# Patient Record
Sex: Male | Born: 1985
Health system: Southern US, Community
[De-identification: ages and names within clinical notes are randomized; demographics above are authoritative.]

## PROBLEM LIST (undated history)

## (undated) DIAGNOSIS — K5090 Crohn's disease, unspecified, without complications: Secondary | ICD-10-CM

## (undated) DIAGNOSIS — K435 Parastomal hernia without obstruction or  gangrene: Secondary | ICD-10-CM

## (undated) DIAGNOSIS — M0760 Enteropathic arthropathies, unspecified site: Secondary | ICD-10-CM

## (undated) DIAGNOSIS — D649 Anemia, unspecified: Secondary | ICD-10-CM

## (undated) DIAGNOSIS — K209 Esophagitis, unspecified without bleeding: Secondary | ICD-10-CM

## (undated) DIAGNOSIS — K3189 Other diseases of stomach and duodenum: Secondary | ICD-10-CM

## (undated) DIAGNOSIS — F419 Anxiety disorder, unspecified: Secondary | ICD-10-CM

## (undated) DIAGNOSIS — K219 Gastro-esophageal reflux disease without esophagitis: Secondary | ICD-10-CM

## (undated) DIAGNOSIS — T8859XA Other complications of anesthesia, initial encounter: Secondary | ICD-10-CM

## (undated) DIAGNOSIS — I10 Essential (primary) hypertension: Secondary | ICD-10-CM

## (undated) DIAGNOSIS — K648 Other hemorrhoids: Secondary | ICD-10-CM

## (undated) DIAGNOSIS — F32A Depression, unspecified: Secondary | ICD-10-CM

## (undated) HISTORY — PX: OTHER SURGICAL HISTORY: SHX169

## (undated) HISTORY — DX: Essential (primary) hypertension: I10

## (undated) HISTORY — DX: Esophagitis, unspecified without bleeding: K20.90

## (undated) HISTORY — DX: Parastomal hernia without obstruction or gangrene: K43.5

## (undated) HISTORY — DX: Other hemorrhoids: K64.8

## (undated) HISTORY — PX: COLON SURGERY: SHX602

## (undated) HISTORY — PX: NECK MASS EXCISION: SHX2079

## (undated) HISTORY — DX: Other diseases of stomach and duodenum: K31.89

---

## 2012-08-27 ENCOUNTER — Emergency Department (HOSPITAL_COMMUNITY)
Admission: EM | Admit: 2012-08-27 | Discharge: 2012-08-27 | Disposition: A | Discharge status: Home / Self Care | Payer: Managed Care, Other (non HMO) | Attending: Emergency Medicine | Admitting: Emergency Medicine

## 2012-08-27 ENCOUNTER — Encounter (HOSPITAL_COMMUNITY): Payer: Self-pay | Admitting: *Deleted

## 2012-08-27 DIAGNOSIS — G8929 Other chronic pain: Secondary | ICD-10-CM | POA: Insufficient documentation

## 2012-08-27 DIAGNOSIS — IMO0002 Reserved for concepts with insufficient information to code with codable children: Secondary | ICD-10-CM | POA: Insufficient documentation

## 2012-08-27 DIAGNOSIS — K5090 Crohn's disease, unspecified, without complications: Secondary | ICD-10-CM | POA: Insufficient documentation

## 2012-08-27 HISTORY — DX: Crohn's disease, unspecified, without complications: K50.90

## 2012-08-27 LAB — CBC WITH DIFFERENTIAL/PLATELET
Basophils Relative: 0 % (ref 0–1)
Eosinophils Absolute: 0.2 10*3/uL (ref 0.0–0.7)
HCT: 34.3 % — ABNORMAL LOW (ref 39.0–52.0)
Hemoglobin: 10.9 g/dL — ABNORMAL LOW (ref 13.0–17.0)
Lymphs Abs: 1.1 10*3/uL (ref 0.7–4.0)
MCH: 25.7 pg — ABNORMAL LOW (ref 26.0–34.0)
MCHC: 31.8 g/dL (ref 30.0–36.0)
Monocytes Absolute: 0.8 10*3/uL (ref 0.1–1.0)
Monocytes Relative: 9 % (ref 3–12)
Neutro Abs: 7.1 10*3/uL (ref 1.7–7.7)
Neutrophils Relative %: 77 % (ref 43–77)
RBC: 4.24 MIL/uL (ref 4.22–5.81)

## 2012-08-27 LAB — URINALYSIS, ROUTINE W REFLEX MICROSCOPIC
Bilirubin Urine: NEGATIVE
Ketones, ur: NEGATIVE mg/dL
Nitrite: NEGATIVE
Protein, ur: NEGATIVE mg/dL

## 2012-08-27 LAB — COMPREHENSIVE METABOLIC PANEL
Albumin: 3.4 g/dL — ABNORMAL LOW (ref 3.5–5.2)
Alkaline Phosphatase: 34 U/L — ABNORMAL LOW (ref 39–117)
BUN: 8 mg/dL (ref 6–23)
Chloride: 100 mEq/L (ref 96–112)
Creatinine, Ser: 0.68 mg/dL (ref 0.50–1.35)
GFR calc Af Amer: >90 mL/min (ref >90)
Glucose, Bld: 97 mg/dL (ref 70–99)
Potassium: 3.6 mEq/L (ref 3.5–5.1)
Total Bilirubin: 0.3 mg/dL (ref 0.3–1.2)

## 2012-08-27 LAB — URINE MICROSCOPIC-ADD ON

## 2012-08-27 MED ORDER — SODIUM CHLORIDE 0.9 % IV SOLN
INTRAVENOUS | Status: DC
  Administered 2012-08-27: 06:00:00 via INTRAVENOUS

## 2012-08-27 MED ORDER — HYDROMORPHONE HCL PF 1 MG/ML IJ SOLN
2.0000 mg | Freq: Once | INTRAMUSCULAR | Status: AC
  Administered 2012-08-27: 2 mg via INTRAVENOUS
  Filled 2012-08-27: qty 2

## 2012-08-27 MED ORDER — ONDANSETRON HCL 4 MG/2ML IJ SOLN
4.0000 mg | Freq: Once | INTRAMUSCULAR | Status: AC
  Administered 2012-08-27: 4 mg via INTRAVENOUS
  Filled 2012-08-27: qty 2

## 2012-08-27 MED ORDER — SODIUM CHLORIDE 0.9 % IV BOLUS (SEPSIS)
1000.0000 mL | Freq: Once | INTRAVENOUS | Status: AC
  Administered 2012-08-27: 1000 mL via INTRAVENOUS

## 2012-08-27 NOTE — ED Notes (Signed)
Pt reports severe abd pain with nauses-pt reports hx of crohn's disease.  Pt also reports joint pain.

## 2012-08-27 NOTE — ED Provider Notes (Signed)
History     CSN: [id number]  Arrival date & time 08/27/12  0234   First MD Initiated Contact with Patient 08/27/12 0403      Chief Complaint  Patient presents with  . Abdominal Pain    (Consider location/radiation/quality/duration/timing/severity/associated sxs/prior treatment) Patient is a 27 y.o. male presenting with abdominal pain. The history is provided by the patient.  Abdominal Pain Associated symptoms include abdominal pain.   patient here complaining of worsening chronic abdominal pain secondary to his Crohn's disease. Takes methotrexate and prednisone as directed. No fever but some chills. Nausea but no vomiting. No bloody stools. Symptoms have been persistent. He is between doctors at this time. Denies any urinary symptoms. His current medications are not helping and pain is characterized as sharp. Nothing makes it worse.  Past Medical History  Diagnosis Date  . Crohn disease     History reviewed. No pertinent past surgical history.  No family history on file.  History  Substance Use Topics  . Smoking status: Never Smoker   . Smokeless tobacco: Not on file  . Alcohol Use: No      Review of Systems  Gastrointestinal: Positive for abdominal pain.  All other systems reviewed and are negative.    Allergies  Humira  Home Medications   Current Outpatient Rx  Name  Route  Sig  Dispense  Refill  . acetaminophen (TYLENOL) 500 MG tablet   Oral   Take 500 mg by mouth every 6 (six) hours as needed for pain (pain).         Marland Kitchen ibuprofen (ADVIL,MOTRIN) 200 MG tablet   Oral   Take 200 mg by mouth every 6 (six) hours as needed for pain (pain).         . Methotrexate Sodium, PF, 50 MG/2ML SOLN   Injection   Inject 25 mg as directed once a week.         . predniSONE (DELTASONE) 10 MG tablet   Oral   Take 20 mg by mouth daily.           BP 147/93  Pulse 82  Temp(Src) 98.8 F (37.1 C) (Oral)  Resp 20  SpO2 98%  Physical Exam  Nursing note and  vitals reviewed. Constitutional: He is oriented to person, place, and time. He appears well-developed and well-nourished.  Non-toxic appearance. No distress.  HENT:  Head: Normocephalic and atraumatic.  Eyes: Conjunctivae, EOM and lids are normal. Pupils are equal, round, and reactive to light.  Neck: Normal range of motion. Neck supple. No tracheal deviation present. No mass present.  Cardiovascular: Normal rate, regular rhythm and normal heart sounds.  Exam reveals no gallop.   No murmur heard. Pulmonary/Chest: Effort normal and breath sounds normal. No stridor. No respiratory distress. He has no decreased breath sounds. He has no wheezes. He has no rhonchi. He has no rales.  Abdominal: Soft. Normal appearance and bowel sounds are normal. He exhibits no distension. There is generalized tenderness. There is no rigidity, no rebound, no guarding and no CVA tenderness.  Musculoskeletal: Normal range of motion. He exhibits no edema and no tenderness.  Neurological: He is alert and oriented to person, place, and time. He has normal strength. No cranial nerve deficit or sensory deficit. GCS eye subscore is 4. GCS verbal subscore is 5. GCS motor subscore is 6.  Skin: Skin is warm and dry. No abrasion and no rash noted.  Psychiatric: He has a normal mood and affect. His speech is normal and  behavior is normal.    ED Course  Procedures (including critical care time)  Labs Reviewed  URINALYSIS, ROUTINE W REFLEX MICROSCOPIC - Abnormal; Notable for the following:    Leukocytes, UA TRACE (*)    All other components within normal limits  URINE MICROSCOPIC-ADD ON  LIPASE, BLOOD  CBC WITH DIFFERENTIAL  COMPREHENSIVE METABOLIC PANEL   No results found.   No diagnosis found.    MDM  Patient given IV fluids and pain medications here feels better. He has no evidence of a surgical abdomen at this time. He will continue to take his prednisone and methotrexate and I will give referral to the  gastroenterologist on call        Toy Baker, MD 08/27/12 [phone]

## 2012-08-29 ENCOUNTER — Inpatient Hospital Stay (HOSPITAL_COMMUNITY)
Admission: EM | Admit: 2012-08-29 | Discharge: 2012-09-03 | DRG: 387 | Disposition: A | Discharge status: Home / Self Care | Payer: Managed Care, Other (non HMO) | Attending: Internal Medicine | Admitting: Internal Medicine

## 2012-08-29 ENCOUNTER — Emergency Department (HOSPITAL_COMMUNITY): Payer: Managed Care, Other (non HMO)

## 2012-08-29 ENCOUNTER — Encounter (HOSPITAL_COMMUNITY): Payer: Self-pay | Admitting: Emergency Medicine

## 2012-08-29 DIAGNOSIS — F1210 Cannabis abuse, uncomplicated: Secondary | ICD-10-CM | POA: Diagnosis present

## 2012-08-29 DIAGNOSIS — K50111 Crohn's disease of large intestine with rectal bleeding: Secondary | ICD-10-CM

## 2012-08-29 DIAGNOSIS — K921 Melena: Secondary | ICD-10-CM | POA: Diagnosis present

## 2012-08-29 DIAGNOSIS — IMO0002 Reserved for concepts with insufficient information to code with codable children: Secondary | ICD-10-CM

## 2012-08-29 DIAGNOSIS — D509 Iron deficiency anemia, unspecified: Secondary | ICD-10-CM | POA: Diagnosis present

## 2012-08-29 DIAGNOSIS — G894 Chronic pain syndrome: Secondary | ICD-10-CM | POA: Diagnosis present

## 2012-08-29 DIAGNOSIS — R109 Unspecified abdominal pain: Secondary | ICD-10-CM

## 2012-08-29 DIAGNOSIS — K5010 Crohn's disease of large intestine without complications: Principal | ICD-10-CM | POA: Diagnosis present

## 2012-08-29 HISTORY — DX: Enteropathic arthropathies, unspecified site: M07.60

## 2012-08-29 HISTORY — DX: Crohn's disease, unspecified, without complications: K50.90

## 2012-08-29 HISTORY — DX: Anemia, unspecified: D64.9

## 2012-08-29 LAB — CBC WITH DIFFERENTIAL/PLATELET
Basophils Absolute: 0 10*3/uL (ref 0.0–0.1)
Lymphocytes Relative: 8 % — ABNORMAL LOW (ref 12–46)
Lymphs Abs: 0.7 10*3/uL (ref 0.7–4.0)
Neutro Abs: 7.9 10*3/uL — ABNORMAL HIGH (ref 1.7–7.7)
Neutrophils Relative %: 83 % — ABNORMAL HIGH (ref 43–77)
Platelets: 500 10*3/uL — ABNORMAL HIGH (ref 150–400)
RBC: 4.85 MIL/uL (ref 4.22–5.81)
RDW: 13 % (ref 11.5–15.5)
WBC: 9.4 10*3/uL (ref 4.0–10.5)

## 2012-08-29 LAB — CBC
HCT: 37.5 % — ABNORMAL LOW (ref 39.0–52.0)
MCHC: 32.8 g/dL (ref 30.0–36.0)
Platelets: 518 10*3/uL — ABNORMAL HIGH (ref 150–400)
RDW: 13 % (ref 11.5–15.5)
WBC: 9 10*3/uL (ref 4.0–10.5)

## 2012-08-29 LAB — CREATININE, SERUM
GFR calc Af Amer: >90 mL/min (ref >90)
GFR calc non Af Amer: >90 mL/min (ref >90)

## 2012-08-29 LAB — URINALYSIS W MICROSCOPIC + REFLEX CULTURE
Bilirubin Urine: NEGATIVE
Glucose, UA: NEGATIVE mg/dL
Hgb urine dipstick: NEGATIVE
Protein, ur: NEGATIVE mg/dL

## 2012-08-29 LAB — COMPREHENSIVE METABOLIC PANEL
ALT: 16 U/L (ref 0–53)
AST: 14 U/L (ref 0–37)
Alkaline Phosphatase: 37 U/L — ABNORMAL LOW (ref 39–117)
CO2: 24 mEq/L (ref 19–32)
Calcium: 9.9 mg/dL (ref 8.4–10.5)
Chloride: 100 mEq/L (ref 96–112)
GFR calc non Af Amer: >90 mL/min (ref >90)
Glucose, Bld: 99 mg/dL (ref 70–99)
Potassium: 3.7 mEq/L (ref 3.5–5.1)
Sodium: 138 mEq/L (ref 135–145)
Total Bilirubin: 0.4 mg/dL (ref 0.3–1.2)

## 2012-08-29 MED ORDER — IOHEXOL 300 MG/ML  SOLN
100.0000 mL | Freq: Once | INTRAMUSCULAR | Status: AC | PRN
  Administered 2012-08-29: 100 mL via INTRAVENOUS

## 2012-08-29 MED ORDER — SODIUM CHLORIDE 0.9 % IV BOLUS (SEPSIS)
1000.0000 mL | Freq: Once | INTRAVENOUS | Status: AC
  Administered 2012-08-29: 1000 mL via INTRAVENOUS

## 2012-08-29 MED ORDER — ONDANSETRON HCL 4 MG/2ML IJ SOLN
4.0000 mg | Freq: Once | INTRAMUSCULAR | Status: AC
  Administered 2012-08-29: 4 mg via INTRAVENOUS
  Filled 2012-08-29: qty 2

## 2012-08-29 MED ORDER — HYDROMORPHONE HCL PF 1 MG/ML IJ SOLN
1.0000 mg | Freq: Once | INTRAMUSCULAR | Status: AC
  Administered 2012-08-29: 1 mg via INTRAVENOUS
  Filled 2012-08-29: qty 1

## 2012-08-29 MED ORDER — METHYLPREDNISOLONE SODIUM SUCC 125 MG IJ SOLR
125.0000 mg | Freq: Once | INTRAMUSCULAR | Status: AC
  Administered 2012-08-29: 125 mg via INTRAVENOUS
  Filled 2012-08-29: qty 2

## 2012-08-29 MED ORDER — HYDROMORPHONE HCL PF 1 MG/ML IJ SOLN
1.0000 mg | INTRAMUSCULAR | Status: DC | PRN
  Administered 2012-08-29 – 2012-09-03 (×49): 1 mg via INTRAVENOUS
  Filled 2012-08-29 (×49): qty 1

## 2012-08-29 MED ORDER — ACETAMINOPHEN 500 MG PO TABS: 500.0000 mg | ORAL_TABLET | Freq: Four times a day (QID) | ORAL | Status: DC | PRN

## 2012-08-29 MED ORDER — IOHEXOL 300 MG/ML  SOLN
50.0000 mL | Freq: Once | INTRAMUSCULAR | Status: AC | PRN
  Administered 2012-08-29: 50 mL via ORAL

## 2012-08-29 MED ORDER — KCL IN DEXTROSE-NACL 40-5-0.9 MEQ/L-%-% IV SOLN
INTRAVENOUS | Status: DC
  Administered 2012-08-29: 100 mL via INTRAVENOUS
  Administered 2012-08-30 – 2012-08-31 (×3): via INTRAVENOUS
  Filled 2012-08-29 (×7): qty 1000

## 2012-08-29 MED ORDER — FOLIC ACID 1 MG PO TABS
1.0000 mg | ORAL_TABLET | Freq: Every day | ORAL | Status: DC
  Administered 2012-08-29 – 2012-09-03 (×6): 1 mg via ORAL
  Filled 2012-08-29 (×6): qty 1

## 2012-08-29 MED ORDER — ONDANSETRON HCL 4 MG PO TABS: 4.0000 mg | ORAL_TABLET | Freq: Four times a day (QID) | ORAL | Status: DC | PRN

## 2012-08-29 MED ORDER — MORPHINE SULFATE 4 MG/ML IJ SOLN
4.0000 mg | Freq: Once | INTRAMUSCULAR | Status: AC
  Administered 2012-08-29: 4 mg via INTRAVENOUS
  Filled 2012-08-29: qty 1

## 2012-08-29 MED ORDER — METHOTREXATE SODIUM CHEMO INJECTION (PF) 50 MG/2ML: 25.0000 mg | INTRAMUSCULAR | Status: DC

## 2012-08-29 MED ORDER — METHYLPREDNISOLONE SODIUM SUCC 40 MG IJ SOLR
40.0000 mg | Freq: Two times a day (BID) | INTRAMUSCULAR | Status: DC
  Administered 2012-08-30 – 2012-09-02 (×7): 40 mg via INTRAVENOUS
  Filled 2012-08-29 (×8): qty 1

## 2012-08-29 MED ORDER — METHOTREXATE SODIUM CHEMO INJECTION 25 MG/ML: 25.0000 mg | INTRAMUSCULAR | Status: DC

## 2012-08-29 MED ORDER — ENOXAPARIN SODIUM 40 MG/0.4ML ~~LOC~~ SOLN
40.0000 mg | SUBCUTANEOUS | Status: DC
  Administered 2012-08-29 – 2012-09-02 (×4): 40 mg via SUBCUTANEOUS
  Filled 2012-08-29 (×6): qty 0.4

## 2012-08-29 MED ORDER — ONDANSETRON HCL 4 MG/2ML IJ SOLN: 4.0000 mg | Freq: Four times a day (QID) | INTRAMUSCULAR | Status: DC | PRN

## 2012-08-29 NOTE — ED Notes (Signed)
Pt has hx of crohns dx. States he feels as if it is flaring up, has not been able to ear since Saturday morning. Stool has been bright red. Pt c/o of lower abdominal pain.

## 2012-08-29 NOTE — ED Notes (Signed)
Patient transported to X-ray 

## 2012-08-29 NOTE — Progress Notes (Signed)
Pt states he does not want to sign up for My Chart currently. States he might sign up when he goes home. Briscoe Burns BSN, RN-BC Admissions RN  08/29/2012 7:45 PM

## 2012-08-29 NOTE — Progress Notes (Signed)
   CARE MANAGEMENT ED NOTE 08/29/2012  Patient:  Troy Ramirez, Troy Ramirez   Account Number:  [id number]  Date Initiated:  08/29/2012  Documentation initiated by:  Radford Pax  Subjective/Objective Assessment:   Patient presents to ED with abdominal pain and nausea     Subjective/Objective Assessment Detail:     Action/Plan:   Action/Plan Detail:   Anticipated DC Date:       Status Recommendation to Physician:   Result of Recommendation:    Other ED Services  Consult Working Plan    DC Planning Services  Other  PCP issues    Choice offered to / List presented to:            Status of service:  Completed, signed off  ED Comments:   ED Comments Detail:  Patient listed as not having a pcp.  EDCM spoke to patient who confirmed he does not have a pcp, but is being followed by a GI specialist for his Chron's disease.  Instructed patient to call the phone number on the back of his insurance card to find a pcp within network.  Patient verbalized understanding.  No further needs at this time.

## 2012-08-29 NOTE — ED Provider Notes (Signed)
History    CSN: [id number] Arrival date & time 08/29/12  1607  First MD Initiated Contact with Patient 08/29/12 1616     Chief Complaint  Patient presents with  . Nausea  . Emesis  . Abdominal Pain   (Consider location/radiation/quality/duration/timing/severity/associated sxs/prior Treatment) HPI Troy Ramirez is a 27 y.o. male who presents to ED with complaint of nausea, vomiting, diarrhea, no appeatite, abdominal pain. States hx of crohn's disease. States has not been feeling well for several weeks. His insurance stopped covering one of his medications around that time. Pt states he is currently taking methotrexate and prednisone daily. States in the last 3 days, increased pain, vomiting, diarrhea with bright red blood. States has been followed by [facility] but unable to go there now. States was referred to Dr. Loreta Ave, he called the office but has not heard back. Was seen here 2 days ago, was rehydrated, discharged home but since then worsening.    Past Medical History  Diagnosis Date  . Crohn disease    History reviewed. No pertinent past surgical history. No family history on file. History  Substance Use Topics  . Smoking status: Never Smoker   . Smokeless tobacco: Not on file  . Alcohol Use: No    Review of Systems  Constitutional: Positive for chills, diaphoresis, appetite change and fatigue. Negative for fever.  Respiratory: Negative.   Cardiovascular: Negative.   Gastrointestinal: Positive for nausea, vomiting, abdominal pain, diarrhea and blood in stool.  Genitourinary: Negative for flank pain.  Skin: Negative.   Neurological: Positive for dizziness, weakness and light-headedness. Negative for headaches.    Allergies  Humira and Remicade  Home Medications   Current Outpatient Rx  Name  Route  Sig  Dispense  Refill  . acetaminophen (TYLENOL) 500 MG tablet   Oral   Take 500-1,000 mg by mouth every 6 (six) hours as needed for pain.          Marland Kitchen ibuprofen  (ADVIL,MOTRIN) 200 MG tablet   Oral   Take 400 mg by mouth every 8 (eight) hours as needed for pain.          . Methotrexate Sodium, PF, 50 MG/2ML SOLN   Injection   Inject 25 mg as directed once a week. Taken on Mondays.         . predniSONE (DELTASONE) 10 MG tablet   Oral   Take 30 mg by mouth daily.           BP 150/93  Pulse 87  Temp(Src) 98.8 F (37.1 C) (Oral)  Resp 22  SpO2 100% Physical Exam  Nursing note and vitals reviewed. Constitutional: He is oriented to person, place, and time. He appears well-developed and well-nourished. No distress.  HENT:  Head: Normocephalic.  Cardiovascular: Normal rate, regular rhythm and normal heart sounds.   Pulmonary/Chest: Effort normal and breath sounds normal. No respiratory distress. He has no wheezes. He has no rales.  Abdominal: Soft. Bowel sounds are normal. He exhibits no distension. There is tenderness. There is guarding.  Musculoskeletal: He exhibits no edema.  Neurological: He is alert and oriented to person, place, and time.  Skin: Skin is warm. He is diaphoretic.    ED Course  Procedures (including critical care time) Results for orders placed during the hospital encounter of 08/29/12  CBC WITH DIFFERENTIAL      Result Value Range   WBC 9.4  4.0 - 10.5 K/uL   RBC 4.85  4.22 - 5.81 MIL/uL  Hemoglobin 12.5 (*) 13.0 - 17.0 g/dL   HCT 16.1 (*) 09.6 - 04.5 %   MCV 79.2  78.0 - 100.0 fL   MCH 25.8 (*) 26.0 - 34.0 pg   MCHC 32.6  30.0 - 36.0 g/dL   RDW 40.9  81.1 - 91.4 %   Platelets 500 (*) 150 - 400 K/uL   Neutrophils Relative % 83 (*) 43 - 77 %   Neutro Abs 7.9 (*) 1.7 - 7.7 K/uL   Lymphocytes Relative 8 (*) 12 - 46 %   Lymphs Abs 0.7  0.7 - 4.0 K/uL   Monocytes Relative 7  3 - 12 %   Monocytes Absolute 0.7  0.1 - 1.0 K/uL   Eosinophils Relative 2  0 - 5 %   Eosinophils Absolute 0.2  0.0 - 0.7 K/uL   Basophils Relative 0  0 - 1 %   Basophils Absolute 0.0  0.0 - 0.1 K/uL  COMPREHENSIVE METABOLIC PANEL       Result Value Range   Sodium 138  135 - 145 mEq/L   Potassium 3.7  3.5 - 5.1 mEq/L   Chloride 100  96 - 112 mEq/L   CO2 24  19 - 32 mEq/L   Glucose, Bld 99  70 - 99 mg/dL   BUN 8  6 - 23 mg/dL   Creatinine, Ser 7.82  0.50 - 1.35 mg/dL   Calcium 9.9  8.4 - 95.6 mg/dL   Total Protein 8.5 (*) 6.0 - 8.3 g/dL   Albumin 3.8  3.5 - 5.2 g/dL   AST 14  0 - 37 U/L   ALT 16  0 - 53 U/L   Alkaline Phosphatase 37 (*) 39 - 117 U/L   Total Bilirubin 0.4  0.3 - 1.2 mg/dL   GFR calc non Af Amer >90  >90 mL/min   GFR calc Af Amer >90  >90 mL/min  LIPASE, BLOOD      Result Value Range   Lipase 20  11 - 59 U/L  URINALYSIS W MICROSCOPIC + REFLEX CULTURE      Result Value Range   Color, Urine YELLOW  YELLOW   APPearance CLEAR  CLEAR   Specific Gravity, Urine 1.021  1.005 - 1.030   pH 5.5  5.0 - 8.0   Glucose, UA NEGATIVE  NEGATIVE mg/dL   Hgb urine dipstick NEGATIVE  NEGATIVE   Bilirubin Urine NEGATIVE  NEGATIVE   Ketones, ur 15 (*) NEGATIVE mg/dL   Protein, ur NEGATIVE  NEGATIVE mg/dL   Urobilinogen, UA 1.0  0.0 - 1.0 mg/dL   Nitrite NEGATIVE  NEGATIVE   Leukocytes, UA TRACE (*) NEGATIVE  CBC      Result Value Range   WBC 9.0  4.0 - 10.5 K/uL   RBC 4.73  4.22 - 5.81 MIL/uL   Hemoglobin 12.3 (*) 13.0 - 17.0 g/dL   HCT 21.3 (*) 08.6 - 57.8 %   MCV 79.3  78.0 - 100.0 fL   MCH 26.0  26.0 - 34.0 pg   MCHC 32.8  30.0 - 36.0 g/dL   RDW 46.9  62.9 - 52.8 %   Platelets 518 (*) 150 - 400 K/uL  CREATININE, SERUM      Result Value Range   Creatinine, Ser 0.61  0.50 - 1.35 mg/dL   GFR calc non Af Amer >90  >90 mL/min   GFR calc Af Amer >90  >90 mL/min  MAGNESIUM      Result Value Range  Magnesium 1.9  1.5 - 2.5 mg/dL   Ct Abdomen Pelvis W Contrast  08/29/2012   *RADIOLOGY REPORT*  Clinical Data: Nausea and vomiting.  Abdominal pain.  Crohn's disease.  CT ABDOMEN AND PELVIS WITH CONTRAST  Technique:  Multidetector CT imaging of the abdomen and pelvis was performed following the standard  protocol during bolus administration of intravenous contrast.  Contrast: 50mL OMNIPAQUE IOHEXOL 300 MG/ML  SOLN, OMNIPAQUE IOHEXOL 300 MG/ML  SOLN  Comparison: Report of prior CT scan dated 10/07/2011 and radiographs dated 08/29/2012  Findings: There is diffuse thickening of the mucosa of the transverse and descending portions of the colon.  The mucosa of the terminal ileum demonstrates slight fatty infiltration.  Appendix is normal.  Stomach and small bowel are normal.  There is no free air or free fluid in the abdomen.  Liver, spleen, pancreas, adrenal glands, kidneys, and biliary tree are normal except for slight focal fatty infiltration adjacent to the falciform ligament in the liver.  No osseous abnormality.  IMPRESSION: Findings consistent with Crohn's colitis.  These findings were reported on the prior exam.  I suspect the findings are chronic.   Original Report Authenticated By: Francene Boyers, M.D.   Dg Abd Acute W/chest  08/29/2012   *RADIOLOGY REPORT*  Clinical Data: Abdominal pain.  Nausea and vomiting.  Current history of Crohn's disease.  ACUTE ABDOMEN SERIES (ABDOMEN 2 VIEW & CHEST 1 VIEW)  Comparison: None.  Findings: No evidence of bowel obstruction.  Abnormal loop of jejunum in the left upper quadrant demonstrating marked wall thickening.  Scattered air-fluid levels in the left upper quadrant. No free intraperitoneal air on the erect image.  Phleboliths in both sides of the pelvis.  No visible opaque urinary tract calculi. Degenerative changes involving the sacroiliac joints.  Regional skeleton otherwise unremarkable.  Cardiomediastinal silhouette unremarkable.   Lungs clear. Bronchovascular markings normal.  Pulmonary vascularity normal.  No pneumothorax.  No pleural effusions.  IMPRESSION:  1.  Abnormal loop of jejunum in the left upper quadrant demonstrating marked wall thickening.  This is consistent with the given history of Crohn's disease. 2.  No evidence of bowel obstruction. 3.   Scattered air-fluid levels in the left upper quadrant likely due to localized reflex ileus. 4.  No acute cardiopulmonary disease.   Original Report Authenticated By: Hulan Saas, M.D.     1. Crohn's colitis, without complications   2. Chronic pain syndrome   3. Chronic steroid use     MDM  Pt with hx of crohn's disease. Here with worsening pain, nausea vomiting. Work up unremarkable other than crohn's colitis. Pt does appear to be dehydrated. Fluids given in ED. Consulted GI, Dr. Christella Hartigan, who recommended to admit pt given his symptoms not improving and it is a bounce back, and he will see in the hospital in AM. Pt on chronic steroids and methotrexate. Given solumedrol in ED IV 125mg . Pt received dilaudid and zofran for his symptoms.   Filed Vitals:   08/29/12 1622 08/29/12 2100 08/29/12 2150  BP: 150/93 153/91 158/82  Pulse: 87 64 69  Temp: 98.8 F (37.1 C) 98.4 F (36.9 C) 98 F (36.7 C)  TempSrc: Oral Oral Oral  Resp: 22 18 20   Height:   6\' 3"  (1.905 m)  Weight:   219 lb (99.338 kg)  SpO2: 100% 99% 96%     Myriam Jacobson Chareese Sergent, PA-C 08/30/12 0155

## 2012-08-29 NOTE — H&P (Signed)
[facility] History and Physical  Troy Ramirez WJX:[id number] DOB: December 10, 1985    PCP:   In between PCP at this time.  Chief Complaint: abdominal pain and bloody diarrhea.  HPI: Troy Ramirez is an 27 y.o. male with crohn's colitis, Dx at age 44, followed by [facility] phsycian, on chronic steroids and narcotics, returned to the ER as he continued to have abdominal pain,and now has bloody diarrhea.  He was seen here a few days ago, and was given pain meds, but returned as he has bloody stool  He doesn't think he can follow up in [facility] any longer because of distance, unavailability of appt, and cost.  He has been on Remicade which worked well, but he had a severe reaction to it (throat swelling, rash....) so he is no longer on it. He was on Humira, but insurance no longer covers it.  He has thus been on Methyltrexate and PO prednisone.  He denied fever, chills, nausea or vomiting, lightheadedness or syncope, but admitted to having decrease appetite, loss of sex drive, and mood swings.  Evalatuion in the ER included no leukocytosis, Hb of 12 g/DL, and normal electrolytes and renal fx tests.  An abdominal CT showed colitis, but no abscess nor free air, no obstruction, and finding likely chronic. EDP consulted Dr Gerilyn Pilgrim of GI, and hosptitalist was asked to admit him for further Tx.  Rewiew of Systems:  Constitutional: Negative for malaise, fever and chills. No significant weight loss or weight gain Eyes: Negative for eye pain, redness and discharge, diplopia, visual changes, or flashes of light. ENMT: Negative for ear pain, hoarseness, nasal congestion, sinus pressure and sore throat. No headaches; tinnitus, drooling, or problem swallowing. Cardiovascular: Negative for chest pain, palpitations, diaphoresis, dyspnea and peripheral edema. ; No orthopnea, PND Respiratory: Negative for cough, hemoptysis, wheezing and stridor. No pleuritic chestpain. Gastrointestinal: Negative for nausea, vomiting,   melena,hematemesis, jaundice and rectal bleeding.    Genitourinary: Negative for frequency, dysuria, incontinence,flank pain and hematuria; Musculoskeletal:  Negative for swelling and trauma.;  Skin: . Negative for pruritus, rash, abrasions, bruising and skin lesion.; ulcerations Neuro: Negative for headache, lightheadedness and neck stiffness. Negative for weakness, altered level of consciousness , altered mental status, extremity weakness, burning feet, involuntary movement, seizure and syncope.  Psych: negative for anxiety,  insomnia, tearfulness, panic attacks, hallucinations, paranoia, suicidal or homicidal ideation    Past Medical History  Diagnosis Date  . Crohn disease     History reviewed. No pertinent past surgical history.  Medications:  HOME MEDS: Prior to Admission medications   Medication Sig Start Date End Date Taking? Authorizing Provider  acetaminophen (TYLENOL) 500 MG tablet Take 500-1,000 mg by mouth every 6 (six) hours as needed for pain.    Yes Historical Provider, MD  ibuprofen (ADVIL,MOTRIN) 200 MG tablet Take 400 mg by mouth every 8 (eight) hours as needed for pain.    Yes Historical Provider, MD  Methotrexate Sodium, PF, 50 MG/2ML SOLN Inject 25 mg as directed once a week. Taken on Mondays.   Yes Historical Provider, MD  predniSONE (DELTASONE) 10 MG tablet Take 30 mg by mouth daily.    Yes Historical Provider, MD     Allergies:  Allergies  Allergen Reactions  . Humira (Adalimumab) Anaphylaxis  . Remicade (Infliximab) Anaphylaxis and Swelling    Social History:   reports that he has never smoked. He does not have any smokeless tobacco history on file. He reports that he uses illicit drugs (Marijuana). He reports  that he does not drink alcohol.  Family History: No family history on file.   Physical Exam: Filed Vitals:   08/29/12 1622  BP: 150/93  Pulse: 87  Temp: 98.8 F (37.1 C)  TempSrc: Oral  Resp: 22  SpO2: 100%   Blood pressure 150/93,  pulse 87, temperature 98.8 F (37.1 C), temperature source Oral, resp. rate 22, SpO2 100.00%.  GEN:  Pleasant  patient lying in the stretcher in no acute distress; cooperative with exam. PSYCH:  alert and oriented x4; does not appear anxious or depressed; affect is appropriate. HEENT: Mucous membranes pink and anicteric; PERRLA; EOM intact; no cervical lymphadenopathy nor thyromegaly or carotid bruit; no JVD; There were no stridor. Neck is very supple. Breasts:: Not examined CHEST WALL: No tenderness CHEST: Normal respiration, clear to auscultation bilaterally.  HEART: Regular rate and rhythm.  There are no murmur, rub, or gallops.   BACK: No kyphosis or scoliosis; no CVA tenderness ABDOMEN: soft and slight diffuse tenderness found.  no masses, no organomegaly, normal abdominal bowel sounds; no pannus; no intertriginous candida. There is no rebound and no distention. Rectal Exam: Not done EXTREMITIES: No bone or joint deformity; age-appropriate arthropathy of the hands and knees; no edema; no ulcerations.  There is no calf tenderness. Genitalia: not examined PULSES: 2+ and symmetric SKIN: Normal hydration no rash or ulceration CNS: Cranial nerves 2-12 grossly intact no focal lateralizing neurologic deficit.  Speech is fluent; uvula elevated with phonation, facial symmetry and tongue midline. DTR are normal bilaterally, cerebella exam is intact, barbinski is negative and strengths are equaled bilaterally.  No sensory loss.   Labs on Admission:  Basic Metabolic Panel:  Recent Labs Lab 08/27/12 0443 08/29/12 1640  NA 137 138  K 3.6 3.7  CL 100 100  CO2 27 24  GLUCOSE 97 99  BUN 8 8  CREATININE 0.68 0.73  CALCIUM 9.4 9.9   Liver Function Tests:  Recent Labs Lab 08/27/12 0443 08/29/12 1640  AST 18 14  ALT 27 16  ALKPHOS 34* 37*  BILITOT 0.3 0.4  PROT 7.7 8.5*  ALBUMIN 3.4* 3.8    Recent Labs Lab 08/27/12 0443 08/29/12 1640  LIPASE 24 20   No results found for this  basename: AMMONIA,  in the last 168 hours CBC:  Recent Labs Lab 08/27/12 0443 08/29/12 1640  WBC 9.3 9.4  NEUTROABS 7.1 7.9*  HGB 10.9* 12.5*  HCT 34.3* 38.4*  MCV 80.9 79.2  PLT 413* 500*   Cardiac Enzymes: No results found for this basename: CKTOTAL, CKMB, CKMBINDEX, TROPONINI,  in the last 168 hours  CBG: No results found for this basename: GLUCAP,  in the last 168 hours   Radiological Exams on Admission: Ct Abdomen Pelvis W Contrast  08/29/2012   *RADIOLOGY REPORT*  Clinical Data: Nausea and vomiting.  Abdominal pain.  Crohn's disease.  CT ABDOMEN AND PELVIS WITH CONTRAST  Technique:  Multidetector CT imaging of the abdomen and pelvis was performed following the standard protocol during bolus administration of intravenous contrast.  Contrast: 50mL OMNIPAQUE IOHEXOL 300 MG/ML  SOLN, OMNIPAQUE IOHEXOL 300 MG/ML  SOLN  Comparison: Report of prior CT scan dated 10/07/2011 and radiographs dated 08/29/2012  Findings: There is diffuse thickening of the mucosa of the transverse and descending portions of the colon.  The mucosa of the terminal ileum demonstrates slight fatty infiltration.  Appendix is normal.  Stomach and small bowel are normal.  There is no free air or free fluid in the abdomen.  Liver, spleen, pancreas, adrenal glands, kidneys, and biliary tree are normal except for slight focal fatty infiltration adjacent to the falciform ligament in the liver.  No osseous abnormality.  IMPRESSION: Findings consistent with Crohn's colitis.  These findings were reported on the prior exam.  I suspect the findings are chronic.   Original Report Authenticated By: Francene Boyers, M.D.   Dg Abd Acute W/chest  08/29/2012   *RADIOLOGY REPORT*  Clinical Data: Abdominal pain.  Nausea and vomiting.  Current history of Crohn's disease.  ACUTE ABDOMEN SERIES (ABDOMEN 2 VIEW & CHEST 1 VIEW)  Comparison: None.  Findings: No evidence of bowel obstruction.  Abnormal loop of jejunum in the left upper  quadrant demonstrating marked wall thickening.  Scattered air-fluid levels in the left upper quadrant. No free intraperitoneal air on the erect image.  Phleboliths in both sides of the pelvis.  No visible opaque urinary tract calculi. Degenerative changes involving the sacroiliac joints.  Regional skeleton otherwise unremarkable.  Cardiomediastinal silhouette unremarkable.   Lungs clear. Bronchovascular markings normal.  Pulmonary vascularity normal.  No pneumothorax.  No pleural effusions.  IMPRESSION:  1.  Abnormal loop of jejunum in the left upper quadrant demonstrating marked wall thickening.  This is consistent with the given history of Crohn's disease. 2.  No evidence of bowel obstruction. 3.  Scattered air-fluid levels in the left upper quadrant likely due to localized reflex ileus. 4.  No acute cardiopulmonary disease.   Original Report Authenticated By: Hulan Saas, M.D.    Assessment/Plan Present on Admission:  . Crohn's colitis . Chronic pain syndrome Chronic steroid use Likely hypotestosteronemia Bloody diarrhea.  PLAN:  Will admit him for crohn's colitis.  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  WIll give IV solumedrol and hold Prednisone.  He will get IVF, and IV pain meds.  Will check B12, RBC folate, Magnesium and Ferritin, as he could have malabsorption from chronic crohn's.  RBC folate as he has been on MTx.  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   I will obtain both free and total.  I recommended that he follow up closely after the acute treatment with both GI and PCP, as I feel he would have great benefits getting his crohn's under remission, thus perhaps avoiding methadone and chronic prednisone.  He is stable, full code, and will be admitted to [facility] service.  Thank you for allowing me to help care for this nice gentleman.  Other plans as per orders.  Code Status:  FULL  Unk Lightning, MD. [facility] Pager [phone] 7pm to 7am.  08/29/2012, 8:28 PM

## 2012-08-30 ENCOUNTER — Encounter (HOSPITAL_COMMUNITY): Payer: Self-pay | Admitting: Physician Assistant

## 2012-08-30 DIAGNOSIS — K921 Melena: Secondary | ICD-10-CM | POA: Diagnosis present

## 2012-08-30 LAB — TESTOSTERONE, FREE: Testosterone, Free: 104.4 pg/mL (ref 47.0–244.0)

## 2012-08-30 LAB — TESTOSTERONE: Testosterone: 406 ng/dL (ref 300–890)

## 2012-08-30 LAB — SEDIMENTATION RATE: Sed Rate: 67 mm/hr — ABNORMAL HIGH (ref 0–16)

## 2012-08-30 LAB — FERRITIN: Ferritin: 47 ng/mL (ref 22–322)

## 2012-08-30 MED ORDER — ADULT MULTIVITAMIN W/MINERALS CH
1.0000 | ORAL_TABLET | Freq: Every day | ORAL | Status: DC
  Administered 2012-08-30 – 2012-09-03 (×5): 1 via ORAL
  Filled 2012-08-30 (×5): qty 1

## 2012-08-30 NOTE — Consult Note (Signed)
[facility] Consult: 11:13 AM 08/30/2012   Referring Provider: Dr David Stall  Primary Care Physician:   Primary Gastroenterologist:  Dr Reader and Nedra Hai in [place], Dr Britt Bolognese at [facility].   Reason for Consultation:  Bloody diarrhea and abdominal pain in pt with Crohn's since age 27  HPI: Seven Dollens is a 27 y.o. male.  On chronic steroids, Methotrexate and narcotics for Crohn's disease.  Treated in past with Remicade but had anaphylaxis type reaction (throat swelling, rash) so it was discontinued. Humira was then used but was not effective.  Under care of Dr Gwenith Spitz, Richardo Priest started, Methotrexate injections initiated 01/2012 along with higher dose Prednison.  However his insurance did not continue covering the Cimzia.  He can't say that it worked or not.  Pain clinic in [facility] was prescribing Methadone for 3 months late in 2013.  This helped all the joint and abdominal pain and he stopped having diarrhea but he stopped attending the clinic as it was an out of network cost, and he did not like being on narcotics.  After stopping, his joint and abdominal pain and diarrhea got worse. Last hospital stay was in Sept 2013 for 2.5 weeks, sounds like he may have had an SBO.  Late last year he was referred to Dr Eusebio Friendly at [facility].  Flex sig in September 2013, in [facility], with severe inflammation beyond 25 Cm.  Colonoscopy in 04/2011 with moderate colitis throughout the colon, terminal ileitis and rectal sparing up to 15 cm.   Seen in [facility] ED 6/22 and again 6/24, when he was admitted.  He complains of abdominal and joint pain.  Stools started showing blood about 5 weeks ago, in last 2 weeks they are consistently bloody and about 3 to 5 times daily.  He has not been to work for 5 days because he feels so poorly.  Appetite is poor and thinks he lost about 10 #, overall had gone from 170s to 220# or so with the Prednisone induced weight  gain. He is nauseated but not vomiting. He self adjusted the Prednisone from 20 to 30 mg daily about one week ago, it made no impact.  Taking Advil 1 to 2 per day along with Tylenol, but not daily.  This does not really help.  Sometimes has used BCs powders, not very helpful.  Has resorted to smoking pot on occasion, which does help but he is not habituated to this or using it often.  Quit smoking cigarettes > one year ago.  Does not drink ETOH He has a brother who also has Crohns, no other relatives with IBD.     Past Medical History  Diagnosis Date  . Crohn disease     History reviewed. No pertinent past surgical history.  Prior to Admission medications   Medication Sig Start Date End Date Taking? Authorizing Provider  acetaminophen (TYLENOL) 500 MG tablet Take 500-1,000 mg by mouth every 6 (six) hours as needed for pain.    Yes Historical Provider, MD  ibuprofen (ADVIL,MOTRIN) 200 MG tablet Take 400 mg by mouth every 8 (eight) hours as needed for pain.    Yes Historical Provider, MD  Methotrexate Sodium, PF, 50 MG/2ML SOLN Inject 25 mg into the skin once a week. Inject 25 mg SQ into stomach weekly on mondays   Yes Historical Provider, MD  predniSONE (DELTASONE) 10 MG tablet Take 30 mg by mouth daily.    Yes Historical Provider, MD    Scheduled Meds: .  enoxaparin (LOVENOX) injection  40 mg Subcutaneous Q24H  . folic acid  1 mg Oral Daily  . [START ON 09/04/2012] methotrexate  25 mg Subcutaneous Weekly  . methylPREDNISolone (SOLU-MEDROL) injection  40 mg Intravenous Q12H   Infusions: . dextrose 5 % and 0.9 % NaCl with KCl 40 mEq/L 100 mL/hr at 08/30/12 1043   PRN Meds: acetaminophen, HYDROmorphone (DILAUDID) injection, ondansetron (ZOFRAN) IV, ondansetron   Allergies as of 08/29/2012 - Review Complete 08/29/2012  Allergen Reaction Noted  . Humira (adalimumab) Anaphylaxis 08/27/2012  . Remicade (infliximab) Anaphylaxis and Swelling 08/29/2012    family history A brother also  has Crohn's dz.   History   Social History  . Marital Status: Single    Spouse Name: N/A    Number of Children: N/A  . Years of Education: N/A   Occupational History  . 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 Smoking status: Never Smoker   . Smokeless tobacco: Not on file  . Alcohol Use: No  . Drug Use: Yes    Special: Marijuana, infrequently.  Has used to get some relief from GI and pain issues.   . Sexually Active: Not on file   Social History Narrative  . Lives with his girlfriend.     REVIEW OF SYSTEMS: See HPI No dysuria, No vision problems except myopia corrected with contact lenses. No rash or skin problems No swellling in feet.   No palpitations. Slight dizziness.  No severe DOE, no cough.    PHYSICAL EXAM: Vital signs in last 24 hours: Temp:  [97.7 F (36.5 C)-98.8 F (37.1 C)] 97.7 F (36.5 C) (06/25 0541) Pulse Rate:  [64-87] 78 (06/25 0541) Resp:  [18-22] 18 (06/25 0541) BP: (150-160)/(82-93) 160/84 mmHg (06/25 0541) SpO2:  [96 %-100 %] 99 % (06/25 0541) Weight:  [99.338 kg (219 lb)] 99.338 kg (219 lb) (06/24 2150)  General: pleasant, well nourished and not ill appearing WM.  comfortable Head:  No asymmetry or swelling  Eyes:  No icterus or pallor Ears:  Not HOH  Nose:  No discharge Mouth:  Clear, moist oral MM Neck:  No masses Lungs:  Clear bil.  Breathing unlabored. Heart: RRR.  No MRG Abdomen:  Soft, minor tenderness in LLQ.  No guard or rebound.  No masses.  BS active.   Rectal: not performed   Musc/Skeltl: no joint erythema or swelling Extremities:  No pedal eda  Neurologic:  Oriented x 3.  No tremor or limb weakness Skin:  No rash Tattoos:  One on chest Nodes:  No inguinal adenopathy.   Psych:  Pleasant, cooperative, good historian.   Intake/Output from previous day: 06/24 0701 - 06/25 0700 In: 900 [I.V.:900] Out: 1150 [Urine:1150] Intake/Output this shift:    LAB  RESULTS:  Recent Labs  08/29/12 1640 08/29/12 2320  WBC 9.4 9.0  HGB 12.5* 12.3*  HCT 38.4* 37.5*  PLT 500* 518*   BMET Lab Results  Component Value Date   NA 138 08/29/2012   NA 137 08/27/2012   K 3.7 08/29/2012   K 3.6 08/27/2012   CL 100 08/29/2012   CL 100 08/27/2012   CO2 24 08/29/2012   CO2 27 08/27/2012   GLUCOSE 99 08/29/2012   GLUCOSE 97 08/27/2012   BUN 8 08/29/2012   BUN 8 08/27/2012   CREATININE 0.61 08/29/2012   CREATININE 0.73 08/29/2012   CREATININE 0.68 08/27/2012   CALCIUM 9.9 08/29/2012  CALCIUM 9.4 08/27/2012   LFT  Recent Labs  08/29/12 1640  PROT 8.5*  ALBUMIN 3.8  AST 14  ALT 16  ALKPHOS 37*  BILITOT 0.4   PT/INR No results found for this basename: INR, PROTIME   Hepatitis Panel No results found for this basename: HEPBSAG, HCVAB, HEPAIGM, HEPBIGM,  in the last 72 hours C-Diff No components found with this basename: cdiff    Drugs of Abuse  No results found for this basename: labopia, cocainscrnur, labbenz, amphetmu, thcu, labbarb     RADIOLOGY STUDIES: Ct Abdomen Pelvis W Contrast  08/29/2012     Findings: There is diffuse thickening of the mucosa of the transverse and descending portions of the colon.  The mucosa of the terminal ileum demonstrates slight fatty infiltration.  Appendix is normal.  Stomach and small bowel are normal.  There is no free air or free fluid in the abdomen.  Liver, spleen, pancreas, adrenal glands, kidneys, and biliary tree are normal except for slight focal fatty infiltration adjacent to the falciform ligament in the liver.  No osseous abnormality.  IMPRESSION: Findings consistent with Crohn's colitis.  These findings were reported on the prior exam.  I suspect the findings are chronic.   Original Report Authenticated By: Francene Boyers, M.D.   Dg Abd Acute W/chest 08/29/2012   .  Findings: No evidence of bowel obstruction.  Abnormal loop of jejunum in the left upper quadrant demonstrating marked wall thickening.  Scattered  air-fluid levels in the left upper quadrant. No free intraperitoneal air on the erect image.  Phleboliths in both sides of the pelvis.  No visible opaque urinary tract calculi. Degenerative changes involving the sacroiliac joints.  Regional skeleton otherwise unremarkable.  Cardiomediastinal silhouette unremarkable.   Lungs clear. Bronchovascular markings normal.  Pulmonary vascularity normal.  No pneumothorax.  No pleural effusions.  IMPRESSION:  1.  Abnormal loop of jejunum in the left upper quadrant demonstrating marked wall thickening.  This is consistent with the given history of Crohn's disease. 2.  No evidence of bowel obstruction. 3.  Scattered air-fluid levels in the left upper quadrant likely due to localized reflex ileus. 4.  No acute cardiopulmonary disease.   Original Report Authenticated By: Hulan Saas, M.D.    ENDOSCOPIC STUDIES: *  Last colonoscopy was in [facility] in fall 2013 *  Last EGD was in 2013, not sure of month, in 2013.   IMPRESSION: *  Long standing Crohn's ileocolitis. Not well controlled with Prednisone and weekly Methotrexate. CT shows active colonic disease.   PLAN: *  Check Stool for C diff *  Solumedrol 40 mg IV q 12 hours, ordered.      LOS: 1 day   Jennye Moccasin  08/30/2012, 11:13 AM Pager: [phone]  GI ATTENDING  Laboratories, x-rays reviewed. Patient personally seen and examined. Agree with history and physical as outlined above. 27 year old gentleman admitted with abdominal pain and diarrhea. This on a background of Crohn's disease for the past 8 years. He has seen multiple gastroenterologists outside of [place], most recently tertiary care IBD expert. His history sounds complex. Outside records have been requested. He has been on multiple medications for Crohn's disease. His disease appears to involve the colon and likely the ileum. Recently on chronic prednisone and methotrexate. Previously on biologic agents, mesalamine agents, and  immunomodulators. Clinically, looks fairly well. Exam unrevealing. We will check stools for Clostridium difficile. Treated with IV Solu-Medrol this point. Review of outside records. Consider endoscopic reevaluation if warranted. May benefit from  metronidazole and antidiarrheals as well. Will follow. Thanks  Wilhemina Bonito. Eda Keys., M.D. [facility] Division of Gastroenterology

## 2012-08-30 NOTE — Progress Notes (Signed)
CARE MANAGEMENT NOTE 08/30/2012  Patient:  Troy Ramirez, Troy Ramirez   Account Number:  [id number]  Date Initiated:  08/30/2012  Documentation initiated by:  [facility]  Subjective/Objective Assessment:   27 year old male with hx of crohn's disease and admitted wth abdominal pain and bloody diarrhea.     Action/Plan:   From home.   Anticipated DC Date:  09/02/2012   Anticipated DC Plan:  HOME/SELF CARE    DC Planning Services  CM consult       Status of service:  In process, will continue to follow Medicare Important Message given?  NA - LOS <3 / Initial given by admissions  Per UR Regulation:  Reviewed for med. necessity/level of care/duration of stay.  Comments:  08/30/12 Shiara Mcgough RN BSN Consult received for me to find a pahrmacy that carries MTX. I met wih pt to discuss this since I noticed he was on MTX before he came to the hospital. He told me that his pharmacy is [address] on [address]. He stated that it has been on back order and they had to get it from another store the last time he needed it. He stated he has 3 weeks worth of MTX at home. With his permission I called his pharmacy to find out how much time he needs to give them when he needs a renewal to make sure they have it and they said 4-5 days. I made pt aware of this.

## 2012-08-30 NOTE — ED Provider Notes (Signed)
Medical screening examination/treatment/procedure(s) were performed by non-physician practitioner and as supervising physician I was immediately available for consultation/collaboration.  Flint Melter, MD 08/30/12 1339

## 2012-08-30 NOTE — Progress Notes (Signed)
Troy Ramirez   Assessment/Plan: Bloody stools due to Crohn's colitis: - on solumedrol and MTX. - consult GI. - B12 800, ferritin 47, check and ESR. - Hbg stable.  Chronic pain syndrome: - narcotics.   Chronic steroid use: - has been on steroids for a prolong time, he probably has a degree of adrenal insufficiency.  Code Status: full Family Communication: none  Disposition Plan: inpatient.   Consultants:  GI  Procedures:  none  Antibiotics:  None  HPI/Subjective: - Continue to have bloody diarrhea, last BM 3 AM. - Not tolerating clear liq diet causes pain.  Objective: Filed Vitals:   08/29/12 1622 08/29/12 2100 08/29/12 2150 08/30/12 0541  BP: 150/93 153/91 158/82 160/84  Pulse: 87 64 69 78  Temp: 98.8 F (37.1 C) 98.4 F (36.9 C) 98 F (36.7 C) 97.7 F (36.5 C)  TempSrc: Oral Oral Oral Oral  Resp: 22 18 20 18   Height:   6\' 3"  (1.905 m)   Weight:   99.338 kg (219 lb)   SpO2: 100% 99% 96% 99%    Intake/Output Summary (Last 24 hours) at 08/30/12 1010 Last data filed at 08/30/12 0700  Gross per 24 hour  Intake    900 ml  Output   1150 ml  Net   -250 ml   Filed Weights   08/29/12 2150  Weight: 99.338 kg (219 lb)    Exam:  General: Alert, awake, oriented x3, in no acute distress.  HEENT: No bruits, no goiter.  Heart: Regular rate and rhythm, without murmurs, rubs, gallops.  Lungs: Good air movement, clear to auscultation Abdomen: Soft, diffuse mild tenderness, nondistended, positive bowel sounds.  Neuro: Grossly intact, nonfocal.   Data Reviewed: Basic Metabolic Panel:  Recent Labs Lab 08/27/12 0443 08/29/12 1640 08/29/12 2320  NA 137 138  --   K 3.6 3.7  --   CL 100 100  --   CO2 27 24  --   GLUCOSE 97 99  --   BUN 8 8  --   CREATININE 0.68 0.73 0.61  CALCIUM 9.4 9.9  --   MG  --   --  1.9   Liver Function Tests:  Recent Labs Lab 08/27/12 0443 08/29/12 1640  AST 18 14  ALT 27 16  ALKPHOS 34* 37*   BILITOT 0.3 0.4  PROT 7.7 8.5*  ALBUMIN 3.4* 3.8    Recent Labs Lab 08/27/12 0443 08/29/12 1640  LIPASE 24 20   No results found for this basename: AMMONIA,  in the last 168 hours CBC:  Recent Labs Lab 08/27/12 0443 08/29/12 1640 08/29/12 2320  WBC 9.3 9.4 9.0  NEUTROABS 7.1 7.9*  --   HGB 10.9* 12.5* 12.3*  HCT 34.3* 38.4* 37.5*  MCV 80.9 79.2 79.3  PLT 413* 500* 518*   Cardiac Enzymes: No results found for this basename: CKTOTAL, CKMB, CKMBINDEX, TROPONINI,  in the last 168 hours BNP (last 3 results) No results found for this basename: PROBNP,  in the last 8760 hours CBG: No results found for this basename: GLUCAP,  in the last 168 hours  No results found for this or any previous visit (from the past 240 hour(s)).   Studies: Ct Abdomen Pelvis W Contrast  08/29/2012   *RADIOLOGY REPORT*  Clinical Data: Nausea and vomiting.  Abdominal pain.  Crohn's disease.  CT ABDOMEN AND PELVIS WITH CONTRAST  Technique:  Multidetector CT imaging of the abdomen and pelvis was performed following the standard protocol during bolus administration  of intravenous contrast.  Contrast: 50mL OMNIPAQUE IOHEXOL 300 MG/ML  SOLN, OMNIPAQUE IOHEXOL 300 MG/ML  SOLN  Comparison: Report of prior CT scan dated 10/07/2011 and radiographs dated 08/29/2012  Findings: There is diffuse thickening of the mucosa of the transverse and descending portions of the colon.  The mucosa of the terminal ileum demonstrates slight fatty infiltration.  Appendix is normal.  Stomach and small bowel are normal.  There is no free air or free fluid in the abdomen.  Liver, spleen, pancreas, adrenal glands, kidneys, and biliary tree are normal except for slight focal fatty infiltration adjacent to the falciform ligament in the liver.  No osseous abnormality.  IMPRESSION: Findings consistent with Crohn's colitis.  These findings were reported on the prior exam.  I suspect the findings are chronic.   Original Report Authenticated  By: Francene Boyers, M.D.   Dg Abd Acute W/chest  08/29/2012   *RADIOLOGY REPORT*  Clinical Data: Abdominal pain.  Nausea and vomiting.  Current history of Crohn's disease.  ACUTE ABDOMEN SERIES (ABDOMEN 2 VIEW & CHEST 1 VIEW)  Comparison: None.  Findings: No evidence of bowel obstruction.  Abnormal loop of jejunum in the left upper quadrant demonstrating marked wall thickening.  Scattered air-fluid levels in the left upper quadrant. No free intraperitoneal air on the erect image.  Phleboliths in both sides of the pelvis.  No visible opaque urinary tract calculi. Degenerative changes involving the sacroiliac joints.  Regional skeleton otherwise unremarkable.  Cardiomediastinal silhouette unremarkable.   Lungs clear. Bronchovascular markings normal.  Pulmonary vascularity normal.  No pneumothorax.  No pleural effusions.  IMPRESSION:  1.  Abnormal loop of jejunum in the left upper quadrant demonstrating marked wall thickening.  This is consistent with the given history of Crohn's disease. 2.  No evidence of bowel obstruction. 3.  Scattered air-fluid levels in the left upper quadrant likely due to localized reflex ileus. 4.  No acute cardiopulmonary disease.   Original Report Authenticated By: Hulan Saas, M.D.    Scheduled Meds: . enoxaparin (LOVENOX) injection  40 mg Subcutaneous Q24H  . folic acid  1 mg Oral Daily  . [START ON 09/04/2012] methotrexate  25 mg Subcutaneous Weekly  . methylPREDNISolone (SOLU-MEDROL) injection  40 mg Intravenous Q12H   Continuous Infusions: . dextrose 5 % and 0.9 % NaCl with KCl 40 mEq/L 100 mL (08/29/12 2218)     Radonna Ricker Rosine Beat  [facility] Pager [phone]. If 8PM-8AM, please contact night-coverage at [email or web address], password [facility] 08/30/2012, 10:10 AM  LOS: 1 day

## 2012-08-30 NOTE — Progress Notes (Signed)
INITIAL NUTRITION ASSESSMENT  DOCUMENTATION CODES Per approved criteria  -Not Applicable   INTERVENTION: Provided "Inflammatory Bowel Disease Nutrition Therapy" handout from the Academy of [employer] Diet advancement per MD discretion Provide Multivitamin with minerals daily  NUTRITION DIAGNOSIS: Inadequate oral intake related to diarrhea and abdominal pain as evidenced by pt's report of not eating solid food for the past 4 days and now on clear liquid diet.    Goal: Pt to meet >/= 90% of their estimated nutrition needs  Monitor:  Diet advancement Bowel Function PO intake Weight Labs  Reason for Assessment: Malnutrition Screening Tool, score of 2  27 y.o. male  Admitting Dx: Crohn's colitis  ASSESSMENT: 27 y.o. male with crohn's colitis, Dx at age 69, followed by [facility] phsycian, on chronic steroids and narcotics, returned to the ER as he continued to have abdominal pain,and now has bloody diarrhea. He was seen here a few days ago, and was given pain meds, but returned as he has bloody stool. Pt reports that he is still having pain with clear liquids. Pt states that the last solid food he ate was a bowl of cereal(with almond milk) on Saturday morning; he has just been drinking fluids since. Pt reports that his weight has fluctuated in the past year due to weight gain with steroids and weight loss with flare-ups. Pt states he weighed 176 lbs less than one year ago and recently gained up to 220 lbs; pt states his usual body weight is between 190 to 200 lbs. Pt reports following a lactose-free, low fiber diet. Pt voiced that additional resources regarding diet for crohn's disease would be beneficial; RD provided "Inflammatory Bowel Disease Nutrition Therapy" handout from the Academy of [employer]etics and reviewed handout with patient; pt appreciative and voiced understanding.   Height: Ht Readings from Last 1 Encounters:  08/29/12 6\' 3"  (1.905 m)     Weight: Wt Readings from Last 1 Encounters:  08/29/12 219 lb (99.338 kg)    Ideal Body Weight: 196 lbs  % Ideal Body Weight: 112 %  Wt Readings from Last 10 Encounters:  08/29/12 219 lb (99.338 kg)    Usual Body Weight: 190-200 lbs  % Usual Body Weight: >109%  BMI:  Body mass index is 27.37 kg/(m^2).  Estimated Nutritional Needs: Kcal: 2600-2850 Protein: 120-139 grams Fluid: 2.7 L  Skin: WDL  Diet Order: Clear Liquid  EDUCATION NEEDS: -No education needs identified at this time   Intake/Output Summary (Last 24 hours) at 08/30/12 1302 Last data filed at 08/30/12 0700  Gross per 24 hour  Intake    900 ml  Output   1150 ml  Net   -250 ml    Last BM: 6/25   Labs:   Recent Labs Lab 08/27/12 0443 08/29/12 1640 08/29/12 2320  NA 137 138  --   K 3.6 3.7  --   CL 100 100  --   CO2 27 24  --   BUN 8 8  --   CREATININE 0.68 0.73 0.61  CALCIUM 9.4 9.9  --   MG  --   --  1.9  GLUCOSE 97 99  --     CBG (last 3)  No results found for this basename: GLUCAP,  in the last 72 hours  Scheduled Meds: . enoxaparin (LOVENOX) injection  40 mg Subcutaneous Q24H  . folic acid  1 mg Oral Daily  . [START ON 09/04/2012] methotrexate  25 mg Subcutaneous Weekly  . methylPREDNISolone (SOLU-MEDROL)  injection  40 mg Intravenous Q12H    Continuous Infusions: . dextrose 5 % and 0.9 % NaCl with KCl 40 mEq/L 100 mL/hr at 08/30/12 1043    Past Medical History  Diagnosis Date  . Crohn disease   . Anemia     required transfusion of bood in 2008 or 2009  . 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     History reviewed. No pertinent past surgical history.  Ian Malkin RD, LDN Inpatient Clinical Dietitian Pager: [phone] After Hours Pager: [phone]

## 2012-08-31 LAB — CLOSTRIDIUM DIFFICILE BY PCR: Toxigenic C. Difficile by PCR: NEGATIVE

## 2012-08-31 MED ORDER — METRONIDAZOLE 500 MG PO TABS
500.0000 mg | ORAL_TABLET | Freq: Three times a day (TID) | ORAL | Status: DC
  Administered 2012-08-31 – 2012-09-03 (×10): 500 mg via ORAL
  Filled 2012-08-31 (×12): qty 1

## 2012-08-31 MED ORDER — FERROUS SULFATE 325 (65 FE) MG PO TABS
325.0000 mg | ORAL_TABLET | Freq: Three times a day (TID) | ORAL | Status: DC
  Administered 2012-08-31 – 2012-09-03 (×10): 325 mg via ORAL
  Filled 2012-08-31 (×12): qty 1

## 2012-08-31 MED ORDER — METRONIDAZOLE 500 MG PO TABS
500.0000 mg | ORAL_TABLET | Freq: Three times a day (TID) | ORAL | Status: DC
Start: 2012-08-31 — End: 2012-08-31
  Administered 2012-08-31: 500 mg via ORAL
  Filled 2012-08-31 (×4): qty 1

## 2012-08-31 MED ORDER — DIPHENOXYLATE-ATROPINE 2.5-0.025 MG PO TABS: 1.0000 | ORAL_TABLET | Freq: Four times a day (QID) | ORAL | Status: DC | PRN

## 2012-08-31 MED ORDER — CHLORPROMAZINE HCL 25 MG/ML IJ SOLN
25.0000 mg | Freq: Three times a day (TID) | INTRAMUSCULAR | Status: DC | PRN
  Administered 2012-08-31: 25 mg via INTRAVENOUS
  Filled 2012-08-31: qty 1

## 2012-08-31 MED ORDER — CHLORPROMAZINE HCL 25 MG/ML IJ SOLN: 25.0000 mg | Freq: Three times a day (TID) | INTRAMUSCULAR | Status: DC | PRN

## 2012-08-31 NOTE — Progress Notes (Signed)
[facility] PROGRESS NOTE   Assessment/Plan: Bloody stools due to Crohn's colitis: - on solumedrol and MTX. - consult GI, appreciate assistance. Recommended C. Dif which is pending and start flagyl and antidiarrheal agent - B12 800, ferritin 47,ESR 67. - Hbg stable.  Chronic pain syndrome: - narcotics.   Chronic steroid use: - has been on steroids for a prolong time, he probably has a degree of adrenal insufficiency.  Microcytic anemia: - Ferritin WNL, ESR high ferrtin acting like acute phase reactant. MCV borderline low - start oral iron therapy.  Code Status: full Family Communication: none  Disposition Plan: inpatient.   Consultants:  GI  Procedures:  none  Antibiotics:  None  HPI/Subjective: - Continue to have bloody diarrhea. - tolerating clear liq die.  Objective: Filed Vitals:   08/30/12 0541 08/30/12 1526 08/30/12 2059 08/31/12 0536  BP: 160/84 143/81 135/69 141/95  Pulse: 78 70 59 63  Temp: 97.7 F (36.5 C) 97.8 F (36.6 C) 97.9 F (36.6 C) 97.5 F (36.4 C)  TempSrc: Oral Oral Oral Oral  Resp: 18 18 16 16   Height:      Weight:      SpO2: 99% 99% 98% 99%    Intake/Output Summary (Last 24 hours) at 08/31/12 0802 Last data filed at 08/31/12 0600  Gross per 24 hour  Intake   2300 ml  Output   2075 ml  Net    225 ml   Filed Weights   08/29/12 2150  Weight: 99.338 kg (219 lb)    Exam:  General: Alert, awake, oriented x3, in no acute distress.  HEENT: No bruits, no goiter.  Heart: Regular rate and rhythm, without murmurs, rubs, gallops.  Lungs: Good air movement, clear to auscultation Abdomen: Soft, diffuse mild tenderness, nondistended, positive bowel sounds.  Neuro: Grossly intact, nonfocal.   Data Reviewed: Basic Metabolic Panel:  Recent Labs Lab 08/27/12 0443 08/29/12 1640 08/29/12 2320  NA 137 138  --   K 3.6 3.7  --   CL 100 100  --   CO2 27 24  --   GLUCOSE 97 99  --   BUN 8 8  --   CREATININE 0.68 0.73  0.61  CALCIUM 9.4 9.9  --   MG  --   --  1.9   Liver Function Tests:  Recent Labs Lab 08/27/12 0443 08/29/12 1640  AST 18 14  ALT 27 16  ALKPHOS 34* 37*  BILITOT 0.3 0.4  PROT 7.7 8.5*  ALBUMIN 3.4* 3.8    Recent Labs Lab 08/27/12 0443 08/29/12 1640  LIPASE 24 20   No results found for this basename: AMMONIA,  in the last 168 hours CBC:  Recent Labs Lab 08/27/12 0443 08/29/12 1640 08/29/12 2320  WBC 9.3 9.4 9.0  NEUTROABS 7.1 7.9*  --   HGB 10.9* 12.5* 12.3*  HCT 34.3* 38.4* 37.5*  MCV 80.9 79.2 79.3  PLT 413* 500* 518*   Cardiac Enzymes: No results found for this basename: CKTOTAL, CKMB, CKMBINDEX, TROPONINI,  in the last 168 hours BNP (last 3 results) No results found for this basename: PROBNP,  in the last 8760 hours CBG: No results found for this basename: GLUCAP,  in the last 168 hours  No results found for this or any previous visit (from the past 240 hour(s)).   Studies: Ct Abdomen Pelvis W Contrast  08/29/2012   *RADIOLOGY REPORT*  Clinical Data: Nausea and vomiting.  Abdominal pain.  Crohn's disease.  CT ABDOMEN AND PELVIS WITH  CONTRAST  Technique:  Multidetector CT imaging of the abdomen and pelvis was performed following the standard protocol during bolus administration of intravenous contrast.  Contrast: 50mL OMNIPAQUE IOHEXOL 300 MG/ML  SOLN, OMNIPAQUE IOHEXOL 300 MG/ML  SOLN  Comparison: Report of prior CT scan dated 10/07/2011 and radiographs dated 08/29/2012  Findings: There is diffuse thickening of the mucosa of the transverse and descending portions of the colon.  The mucosa of the terminal ileum demonstrates slight fatty infiltration.  Appendix is normal.  Stomach and small bowel are normal.  There is no free air or free fluid in the abdomen.  Liver, spleen, pancreas, adrenal glands, kidneys, and biliary tree are normal except for slight focal fatty infiltration adjacent to the falciform ligament in the liver.  No osseous abnormality.   IMPRESSION: Findings consistent with Crohn's colitis.  These findings were reported on the prior exam.  I suspect the findings are chronic.   Original Report Authenticated By: Francene Boyers, M.D.   Dg Abd Acute W/chest  08/29/2012   *RADIOLOGY REPORT*  Clinical Data: Abdominal pain.  Nausea and vomiting.  Current history of Crohn's disease.  ACUTE ABDOMEN SERIES (ABDOMEN 2 VIEW & CHEST 1 VIEW)  Comparison: None.  Findings: No evidence of bowel obstruction.  Abnormal loop of jejunum in the left upper quadrant demonstrating marked wall thickening.  Scattered air-fluid levels in the left upper quadrant. No free intraperitoneal air on the erect image.  Phleboliths in both sides of the pelvis.  No visible opaque urinary tract calculi. Degenerative changes involving the sacroiliac joints.  Regional skeleton otherwise unremarkable.  Cardiomediastinal silhouette unremarkable.   Lungs clear. Bronchovascular markings normal.  Pulmonary vascularity normal.  No pneumothorax.  No pleural effusions.  IMPRESSION:  1.  Abnormal loop of jejunum in the left upper quadrant demonstrating marked wall thickening.  This is consistent with the given history of Crohn's disease. 2.  No evidence of bowel obstruction. 3.  Scattered air-fluid levels in the left upper quadrant likely due to localized reflex ileus. 4.  No acute cardiopulmonary disease.   Original Report Authenticated By: Hulan Saas, M.D.    Scheduled Meds: . enoxaparin (LOVENOX) injection  40 mg Subcutaneous Q24H  . folic acid  1 mg Oral Daily  . [START ON 09/04/2012] methotrexate  25 mg Subcutaneous Weekly  . methylPREDNISolone (SOLU-MEDROL) injection  40 mg Intravenous Q12H  . multivitamin with minerals  1 tablet Oral Daily   Continuous Infusions: . dextrose 5 % and 0.9 % NaCl with KCl 40 mEq/L 100 mL/hr at 08/30/12 2040     Marinda Elk  [facility] Pager [phone]. If 8PM-8AM, please contact night-coverage at [email or web address], password  [facility] 08/31/2012, 8:02 AM  LOS: 2 days

## 2012-08-31 NOTE — Progress Notes (Signed)
Patient ID: Troy Ramirez, male   DOB: [birth year], 27 y.o.   MRN: [id number] [facility] Progress Note  Subjective: Feels about the  Same Wants to stay on clear liquids another day-still cramping and uncomfortable. 2  bm's today so far - no gross blood Stool cdiff negative   Objective:  Vital signs in last 24 hours: Temp:  [97.5 F (36.4 C)-97.9 F (36.6 C)] 97.5 F (36.4 C) (06/26 0536) Pulse Rate:  [59-70] 63 (06/26 0536) Resp:  [16-18] 16 (06/26 0536) BP: (135-143)/(69-95) 141/95 mmHg (06/26 0536) SpO2:  [98 %-99 %] 99 % (06/26 0536) Last BM Date: 08/31/12 General:   Alert,  Well-developed, Wm    in NAD Heart:  Regular rate and rhythm; no murmurs Pulm;clear Abdomen:  Soft, tender  Across lower abdomenand nondistended. Normal bowel sounds, without guarding, and without rebound.   Extremities:  Without edema. Neurologic:  Alert and  oriented x4;  grossly normal neurologically. Psych:  Alert and cooperative. Normal mood and affect.  Intake/Output from previous day: 06/25 0701 - 06/26 0700 In: 2300 [I.V.:2300] Out: 2075 [Urine:2075] Intake/Output this shift:    Lab Results:  Recent Labs  08/29/12 1640 08/29/12 2320  WBC 9.4 9.0  HGB 12.5* 12.3*  HCT 38.4* 37.5*  PLT 500* 518*   BMET  Recent Labs  08/29/12 1640 08/29/12 2320  NA 138  --   K 3.7  --   CL 100  --   CO2 24  --   GLUCOSE 99  --   BUN 8  --   CREATININE 0.73 0.61  CALCIUM 9.9  --    LFT  Recent Labs  08/29/12 1640  PROT 8.5*  ALBUMIN 3.8  AST 14  ALT 16  ALKPHOS 37*  BILITOT 0.4    Assessment / Plan: #1  27 yo male with longstanding  Crohns  Ileocolitis  With  exacerbation refractory to oral  steroids as outpt. Day #2  Iv solumedrol 80  QD Methotrexate once weekly Flagyl 500 bid Continue current regimen, may take 4-5 days for response with IV steroids He needs a biologic. Tolerated Cimzia well but insurance cancelled it stating he had a pre-existing condition!  Will  look into options thru [employer] rep, as would like to resume.  Principal Problem:   Crohn's colitis Active Problems:   Chronic pain syndrome   Chronic steroid use   Bloody stools     LOS: 2 days   [name]  08/31/2012, 10:27 AM   GI ATTENDING  SEEN AND EXAMINED. AGREE WITH ABOVE. STABLE. MINIMIZE PAIN MEDS. YET TO REVIEW / OBTAIN ALL OUTSIDE RECORDS. ADVANCE ACTIVITY / DIET.  Wilhemina Bonito. Eda Keys., M.D. [facility] Division of Gastroenterology

## 2012-09-01 DIAGNOSIS — R109 Unspecified abdominal pain: Secondary | ICD-10-CM

## 2012-09-01 MED ORDER — DEXTROSE 5 % IV SOLN
INTRAVENOUS | Status: DC
  Administered 2012-09-01 – 2012-09-03 (×3): via INTRAVENOUS

## 2012-09-01 MED ORDER — IBUPROFEN 800 MG PO TABS: 400.0000 mg | ORAL_TABLET | Freq: Four times a day (QID) | ORAL | Status: DC | PRN

## 2012-09-01 MED ORDER — DICYCLOMINE HCL 10 MG PO CAPS
10.0000 mg | ORAL_CAPSULE | Freq: Three times a day (TID) | ORAL | Status: DC
  Administered 2012-09-01 – 2012-09-03 (×8): 10 mg via ORAL
  Filled 2012-09-01 (×11): qty 1

## 2012-09-01 MED ORDER — PANTOPRAZOLE SODIUM 40 MG PO TBEC
40.0000 mg | DELAYED_RELEASE_TABLET | Freq: Every day | ORAL | Status: DC
  Administered 2012-09-02 – 2012-09-03 (×2): 40 mg via ORAL
  Filled 2012-09-01 (×2): qty 1

## 2012-09-01 NOTE — Progress Notes (Signed)
[facility] PROGRESS NOTE   Assessment/Plan: Bloody stools due to Crohn's colitis: - on solumedrol and MTX. - consult GI, appreciate assistance. C. Dif negative, start flagyl and antidiarrheal agent. - relates he wants more pain medication. I have explained to him that we cannot increase it. - B12 800, ferritin 47,ESR 67. - Hbg stable.  Chronic pain syndrome: - narcotics.   Chronic steroid use: - has been on steroids for a prolong time, he probably has a degree of adrenal insufficiency. - tapared as an outpatient slowly.  Microcytic anemia: - Ferritin WNL, ESR high ferrtin acting like acute phase reactant. MCV borderline low - start oral iron therapy.  Code Status: full Family Communication: none  Disposition Plan: inpatient.   Consultants:  GI  Procedures:  none  Antibiotics:  None  HPI/Subjective: - pain medication only lasting 1hr. - not tolerating clear liq die.  Objective: Filed Vitals:   08/30/12 1526 08/30/12 2059 08/31/12 0536 08/31/12 1435  BP: 143/81 135/69 141/95 162/91  Pulse: 70 59 63 85  Temp: 97.8 F (36.6 C) 97.9 F (36.6 C) 97.5 F (36.4 C) 98.4 F (36.9 C)  TempSrc: Oral Oral Oral Oral  Resp: 18 16 16 16   Height:      Weight:      SpO2: 99% 98% 99% 100%    Intake/Output Summary (Last 24 hours) at 09/01/12 0816 Last data filed at 08/31/12 1748  Gross per 24 hour  Intake   1300 ml  Output      0 ml  Net   1300 ml   Filed Weights   08/29/12 2150  Weight: 99.338 kg (219 lb)    Exam:  General: Alert, awake, oriented x3, in no acute distress.  HEENT: No bruits, no goiter.  Heart: Regular rate and rhythm, without murmurs, rubs, gallops.  Lungs: Good air movement, clear to auscultation Abdomen: Soft, diffuse mild tenderness, nondistended, positive bowel sounds.  Neuro: Grossly intact, nonfocal.   Data Reviewed: Basic Metabolic Panel:  Recent Labs Lab 08/27/12 0443 08/29/12 1640 08/29/12 2320  NA 137 138  --    K 3.6 3.7  --   CL 100 100  --   CO2 27 24  --   GLUCOSE 97 99  --   BUN 8 8  --   CREATININE 0.68 0.73 0.61  CALCIUM 9.4 9.9  --   MG  --   --  1.9   Liver Function Tests:  Recent Labs Lab 08/27/12 0443 08/29/12 1640  AST 18 14  ALT 27 16  ALKPHOS 34* 37*  BILITOT 0.3 0.4  PROT 7.7 8.5*  ALBUMIN 3.4* 3.8    Recent Labs Lab 08/27/12 0443 08/29/12 1640  LIPASE 24 20   No results found for this basename: AMMONIA,  in the last 168 hours CBC:  Recent Labs Lab 08/27/12 0443 08/29/12 1640 08/29/12 2320  WBC 9.3 9.4 9.0  NEUTROABS 7.1 7.9*  --   HGB 10.9* 12.5* 12.3*  HCT 34.3* 38.4* 37.5*  MCV 80.9 79.2 79.3  PLT 413* 500* 518*   Cardiac Enzymes: No results found for this basename: CKTOTAL, CKMB, CKMBINDEX, TROPONINI,  in the last 168 hours BNP (last 3 results) No results found for this basename: PROBNP,  in the last 8760 hours CBG: No results found for this basename: GLUCAP,  in the last 168 hours  Recent Results (from the past 240 hour(s))  CLOSTRIDIUM DIFFICILE BY PCR     Status: None   Collection Time  08/30/12  4:15 PM      Result Value Range Status   C difficile by pcr NEGATIVE  NEGATIVE Final     Studies: No results found.  Scheduled Meds: . enoxaparin (LOVENOX) injection  40 mg Subcutaneous Q24H  . ferrous sulfate  325 mg Oral TID WC  . folic acid  1 mg Oral Daily  . [START ON 09/04/2012] methotrexate  25 mg Subcutaneous Weekly  . methylPREDNISolone (SOLU-MEDROL) injection  40 mg Intravenous Q12H  . metroNIDAZOLE  500 mg Oral Q8H  . multivitamin with minerals  1 tablet Oral Daily   Continuous Infusions: . dextrose 5 % and 0.9 % NaCl with KCl 40 mEq/L 100 mL/hr at 08/31/12 1842     Marinda Elk  [facility] Pager [phone]. If 8PM-8AM, please contact night-coverage at [email or web address], password [facility] 09/01/2012, 8:16 AM  LOS: 3 days

## 2012-09-01 NOTE — Progress Notes (Signed)
Patient ID: Troy Ramirez, male   DOB: [birth year], 27 y.o.   MRN: [id number] [facility] Progress Note  Subjective: Still having a lot of pain, doesn't want to eat. No bleeding and only 2 BM"s yesterday Says was on Methadone up until 3 months ago for abdominal and joint pain- ot worked great but pain clcinic out of network so stopped going- he is to call his insurance today to find out which pain clinic is in network, and advised he get appt  Objective:  Vital signs in last 24 hours: Temp:  [98.4 F (36.9 C)] 98.4 F (36.9 C) (06/26 1435) Pulse Rate:  [85] 85 (06/26 1435) Resp:  [16] 16 (06/26 1435) BP: (162)/(91) 162/91 mmHg (06/26 1435) SpO2:  [100 %] 100 % (06/26 1435) Last BM Date: 08/31/12 General:   Alert,  Well-developed, WM   in NAD Heart:  Regular rate and rhythm; no murmurs Pulm;clear Abdomen:  Soft, tender across mid abdomen and nondistended. Normal bowel sounds, without guarding, and without rebound.   Extremities:  Without edema. Neurologic:  Alert and  oriented x4;  grossly normal neurologically. Psych:  Alert and cooperative. Normal mood and affect.  Intake/Output from previous day: 06/26 0701 - 06/27 0700 In: 1300 [P.O.:100; I.V.:1200] Out: -  Intake/Output this shift:    Lab Results:  Recent Labs  08/29/12 1640 08/29/12 2320  WBC 9.4 9.0  HGB 12.5* 12.3*  HCT 38.4* 37.5*  PLT 500* 518*   BMET  Recent Labs  08/29/12 1640 08/29/12 2320  NA 138  --   K 3.7  --   CL 100  --   CO2 24  --   GLUCOSE 99  --   BUN 8  --   CREATININE 0.73 0.61  CALCIUM 9.9  --    LFT  Recent Labs  08/29/12 1640  PROT 8.5*  ALBUMIN 3.8  AST 14  ALT 16  ALKPHOS 37*  BILITOT 0.4      Assessment / Plan: #1 27 yo WM with acute exacerbation of Crohns ileocolitis- Continue clears until he is ready to advance Add bentyl around the clock Continue Solumedrol 80 daily We are working on [employer] options as an outpt -hope to get him stated back on it soon  . Pt to get re-established with a pain clinic Stop Ibuprofen- contarindicated with colitis Principal Problem:   Crohn's colitis Active Problems:   Chronic pain syndrome   Chronic steroid use   Bloody stools     LOS: 3 days   [name]  09/01/2012, 12:26 PM   GI ATTENDING  Patient personally seen and examined. Interval history and laboratories reviewed. Agree with H&P as outlined above. He continues to complain of abdominal pain. Stasis was exacerbated by meals. No vomiting. No diarrhea. No bleeding. Vital signs stable. Looks well, except for depressed. Abdomen is soft with complaints of tenderness with palpation throughout. I have encouraged him to ambulate. Advance diet as tolerated. Would be careful with pain medication. Continue steroids for now.  Wilhemina Bonito. Eda Keys., M.D. [facility] Division of Gastroenterology

## 2012-09-02 LAB — CBC
Hemoglobin: 13.6 g/dL (ref 13.0–17.0)
MCH: 26.4 pg (ref 26.0–34.0)
MCHC: 33.3 g/dL (ref 30.0–36.0)
Platelets: 540 10*3/uL — ABNORMAL HIGH (ref 150–400)
RDW: 12.9 % (ref 11.5–15.5)

## 2012-09-02 MED ORDER — METHYLPREDNISOLONE SODIUM SUCC 40 MG IJ SOLR
30.0000 mg | Freq: Two times a day (BID) | INTRAMUSCULAR | Status: DC
  Administered 2012-09-02: 30 mg via INTRAVENOUS
  Filled 2012-09-02 (×3): qty 0.75

## 2012-09-02 MED ORDER — MORPHINE SULFATE ER 30 MG PO TBCR
30.0000 mg | EXTENDED_RELEASE_TABLET | Freq: Two times a day (BID) | ORAL | Status: DC
  Administered 2012-09-02 (×2): 30 mg via ORAL
  Filled 2012-09-02 (×2): qty 1

## 2012-09-02 NOTE — Progress Notes (Signed)
Patient ID: Troy Ramirez, male   DOB: 1985/03/21, 27 y.o.   MRN: [id number] [facility] Progress Note  Subjective:  Feeling a little better- bentyl helping, no BM's today . Wants to try bland diet. Was started on MS contin today  Objective:  Vital signs in last 24 hours: Temp:  [97.5 F (36.4 C)-99.2 F (37.3 C)] 97.5 F (36.4 C) (06/28 0600) Pulse Rate:  [57-73] 58 (06/28 0600) Resp:  [18] 18 (06/28 0600) BP: (144-147)/(89-90) 147/90 mmHg (06/28 0600) SpO2:  [97 %-99 %] 99 % (06/28 0600) Last BM Date: 09/01/12 General:   Alert,  Well-developed, WM   in NAD Heart:  Regular rate and rhythm; no murmurs Pulm;clear Abdomen:  Soft, mildly tender and nondistended. Normal bowel sounds, without guarding, and without rebound.   Extremities:  Without edema. Neurologic:  Alert and  oriented x4;  grossly normal neurologically. Psych:  Alert and cooperative. Normal mood and affect.,anxious  Intake/Output from previous day: 06/27 0701 - 06/28 0700 In: 708.8 [I.V.:683.8; IV Piggyback:25] Out: 1750 [Urine:1750] Intake/Output this shift: Total I/O In: 1143.8 [P.O.:120; I.V.:1023.8] Out: 700 [Urine:700]    Assessment / Plan: #1 27 yo male with Crohns ileocolitis with exacerbation- he is improving Advance diet to bland Decrease Solumedrol to 60 daily-then plan discharge on 40 mg prednisone daily Continue Methotrexate, bentyl  Working on outpt options for Cimzia #2 Chronic pain syndrome- needs are out of proportion to what is expected with colitis- he will be encouraged to establish with another pain clinic Principal Problem:   Crohn's colitis Active Problems:   Chronic pain syndrome   Chronic steroid use   Bloody stools     LOS: 4 days   [name]  09/02/2012, 10:32 AM   GI ATTENDING  Patient personally seen and examined. Agree with interval history as outlined above. Seems to be improving from a GI standpoint. I do not course narcotics for his GI issues.  Apparently has problems with joint aches. Needs a pain clinic as suggested above. I would advance his diet and convert prednisone to oral form. Agree with antispasmodics.  Wilhemina Bonito. Eda Keys., M.D. [facility] Division of Gastroenterology

## 2012-09-02 NOTE — Progress Notes (Signed)
[facility] PROGRESS NOTE   Assessment/Plan: Bloody stools due to Crohn's colitis: - on solumedrol and MTX. - consult GI, appreciate assistance. C. Dif negative, start flagyl and antidiarrheal agent. - start long acting narcotics. - Hbg stable.  Chronic pain syndrome: - narcotics. - was on methadone until couple of moths ago.   Chronic steroid use: - has been on steroids for a prolong time, he probably has a degree of adrenal insufficiency. - tapared as an outpatient slowly.  Microcytic anemia: - Ferritin WNL, ESR high ferrtin acting like acute phase reactant. MCV borderline low - start oral iron therapy.  Code Status: full Family Communication: none  Disposition Plan: inpatient.   Consultants:  GI  Procedures:  none  Antibiotics:  None  HPI/Subjective: - pain medication only lasting 1hr. - not tolerating clear liq die.  Objective: Filed Vitals:   08/31/12 1435 09/01/12 1349 09/01/12 2330 09/02/12 0600  BP: 162/91 146/89 144/90 147/90  Pulse: 85 73 57 58  Temp: 98.4 F (36.9 C) 99.2 F (37.3 C) 98.4 F (36.9 C) 97.5 F (36.4 C)  TempSrc: Oral Oral Oral Oral  Resp: 16 18 18 18   Height:      Weight:      SpO2: 100% 99% 97% 99%    Intake/Output Summary (Last 24 hours) at 09/02/12 0923 Last data filed at 09/02/12 0729  Gross per 24 hour  Intake 1732.5 ml  Output   1750 ml  Net  -17.5 ml   Filed Weights   08/29/12 2150  Weight: 99.338 kg (219 lb)    Exam:  General: Alert, awake, oriented x3, in no acute distress.  Heart: Regular rate and rhythm, without murmurs, rubs, gallops.  Lungs: Good air movement, clear to auscultation Abdomen: Soft, diffuse mild tenderness, nondistended, positive bowel sounds.    Data Reviewed: Basic Metabolic Panel:  Recent Labs Lab 08/27/12 0443 08/29/12 1640 08/29/12 2320  NA 137 138  --   K 3.6 3.7  --   CL 100 100  --   CO2 27 24  --   GLUCOSE 97 99  --   BUN 8 8  --   CREATININE 0.68 0.73  0.61  CALCIUM 9.4 9.9  --   MG  --   --  1.9   Liver Function Tests:  Recent Labs Lab 08/27/12 0443 08/29/12 1640  AST 18 14  ALT 27 16  ALKPHOS 34* 37*  BILITOT 0.3 0.4  PROT 7.7 8.5*  ALBUMIN 3.4* 3.8    Recent Labs Lab 08/27/12 0443 08/29/12 1640  LIPASE 24 20   No results found for this basename: AMMONIA,  in the last 168 hours CBC:  Recent Labs Lab 08/27/12 0443 08/29/12 1640 08/29/12 2320  WBC 9.3 9.4 9.0  NEUTROABS 7.1 7.9*  --   HGB 10.9* 12.5* 12.3*  HCT 34.3* 38.4* 37.5*  MCV 80.9 79.2 79.3  PLT 413* 500* 518*   Cardiac Enzymes: No results found for this basename: CKTOTAL, CKMB, CKMBINDEX, TROPONINI,  in the last 168 hours BNP (last 3 results) No results found for this basename: PROBNP,  in the last 8760 hours CBG: No results found for this basename: GLUCAP,  in the last 168 hours  Recent Results (from the past 240 hour(s))  CLOSTRIDIUM DIFFICILE BY PCR     Status: None   Collection Time    08/30/12  4:15 PM      Result Value Range Status   C difficile by pcr NEGATIVE  NEGATIVE Final  Studies: No results found.  Scheduled Meds: . dicyclomine  10 mg Oral TID AC & HS  . enoxaparin (LOVENOX) injection  40 mg Subcutaneous Q24H  . ferrous sulfate  325 mg Oral TID WC  . folic acid  1 mg Oral Daily  . [START ON 09/04/2012] methotrexate  25 mg Subcutaneous Weekly  . methylPREDNISolone (SOLU-MEDROL) injection  40 mg Intravenous Q12H  . metroNIDAZOLE  500 mg Oral Q8H  . multivitamin with minerals  1 tablet Oral Daily  . pantoprazole  40 mg Oral Daily   Continuous Infusions: . dextrose 75 mL/hr at 09/01/12 0843     Marinda Elk  [facility] Pager [phone]. If 8PM-8AM, please contact night-coverage at [email or web address], password [facility] 09/02/2012, 9:23 AM  LOS: 4 days

## 2012-09-03 LAB — BASIC METABOLIC PANEL
Calcium: 10.2 mg/dL (ref 8.4–10.5)
GFR calc Af Amer: >90 mL/min (ref >90)
GFR calc non Af Amer: >90 mL/min (ref >90)
Glucose, Bld: 107 mg/dL — ABNORMAL HIGH (ref 70–99)
Potassium: 3.9 mEq/L (ref 3.5–5.1)
Sodium: 137 mEq/L (ref 135–145)

## 2012-09-03 MED ORDER — FERROUS SULFATE 325 (65 FE) MG PO TABS: 325.0000 mg | ORAL_TABLET | Freq: Three times a day (TID) | ORAL | Status: DC

## 2012-09-03 MED ORDER — MORPHINE SULFATE ER 15 MG PO TBCR: 15.0000 mg | EXTENDED_RELEASE_TABLET | Freq: Two times a day (BID) | ORAL | Status: DC

## 2012-09-03 MED ORDER — MORPHINE SULFATE ER 15 MG PO TBCR
15.0000 mg | EXTENDED_RELEASE_TABLET | Freq: Two times a day (BID) | ORAL | Status: DC
  Administered 2012-09-03: 15 mg via ORAL
  Filled 2012-09-03: qty 1

## 2012-09-03 MED ORDER — PREDNISONE 20 MG PO TABS: 40.0000 mg | ORAL_TABLET | Freq: Every day | ORAL | Status: DC

## 2012-09-03 MED ORDER — DICYCLOMINE HCL 10 MG PO CAPS: 10.0000 mg | ORAL_CAPSULE | Freq: Three times a day (TID) | ORAL | Status: DC

## 2012-09-03 MED ORDER — PREDNISONE 20 MG PO TABS
40.0000 mg | ORAL_TABLET | Freq: Every day | ORAL | Status: DC
  Administered 2012-09-03: 40 mg via ORAL
  Filled 2012-09-03 (×2): qty 2

## 2012-09-03 NOTE — Progress Notes (Signed)
Patient ID: Troy Ramirez, male   DOB: 1985-03-29, 27 y.o.   MRN: [id number] [facility] Progress Note  Subjective: Ate without difficulty yesterday,appetite better. No BM since yesterday am-thinks he can manage at home  Objective:  Vital signs in last 24 hours: Temp:  [98 F (36.7 C)-98.7 F (37.1 C)] 98.7 F (37.1 C) (06/29 0540) Pulse Rate:  [61-83] 61 (06/29 0540) Resp:  [18] 18 (06/29 0540) BP: (131-141)/(76-90) 141/90 mmHg (06/29 0540) SpO2:  [98 %-100 %] 98 % (06/29 0540) Last BM Date: 09/01/12 General:   Alert,  Well-developed,    in NAD Heart:  Regular rate and rhythm; no murmurs Pulm;clear Abdomen:  Soft, mildly tender lower abdomen and nondistended. Normal bowel sounds, without guarding, and without rebound.   Extremities:  Without edema. Neurologic:  Alert and  oriented x4;  grossly normal neurologically. Psych:  Alert and cooperative. Normal mood and affect.  Intake/Output from previous day: 06/28 0701 - 06/29 0700 In: 3432.5 [P.O.:720; I.V.:2712.5] Out: 2900 [Urine:2900] Intake/Output this shift:    Lab Results:  Recent Labs  09/02/12 1027  WBC 8.0  HGB 13.6  HCT 40.9  PLT 540*   BMET  Recent Labs  09/03/12 0530  NA 137  K 3.9  CL 95*  CO2 30  GLUCOSE 107*  BUN 12  CREATININE 0.71  CALCIUM 10.2     Assessment / Plan: #1 27 yo male with Crohns ileocolitis with exacerbation - he is improved, and stable for discharge today Would discharge on 40 mg prednisone q am Bentyl 10 mg q 6 hours prn ramping methotrexate as previous  Stop flagylprotonix 40 mg daily MS contin is fine but we will not Rx this for him - he needs a pain clinic and is aware he will need to establish with a new pain clinic for chronic narcotics Our office will call him at home this week with follow up appt with Dr.[name] for 2 weeks or so out, and as mentioned previously e are investigating options for cimzia for him   Principal Problem:   Crohn's colitis Active  Problems:   Chronic pain syndrome   Chronic steroid use   Bloody stools     LOS: 5 days   Troy Ramirez  09/03/2012, 9:09 AM   GI ATTENDING  Agree with assessment and plan, history and physical as outlined above. Clearly ready to go from GI standpoint. Discharge medications as outlined. He wants to followup with our office. I'll see him a couple weeks. Continuing current dose of prednisone in the interim.  Troy Ramirez. Troy Ramirez., M.D. [facility] Division of Gastroenterology

## 2012-09-03 NOTE — Discharge Summary (Signed)
Physician Discharge Summary  Troy Ramirez ZOX:[id number] DOB: [birth year] DOA: 08/29/2012  PCP: No primary provider on file.  Admit date: 08/29/2012 Discharge date: 09/03/2012  Time spent: 35 minutes  Recommendations for Outpatient Follow-up:  1. Follow up with GI in 2 weeks.  Dicharge Diagnoses:  Principal Problem:   Crohn's colitis Active Problems:   Chronic pain syndrome   Chronic steroid use   Bloody stools   Discharge Condition: stable  Diet recommendation: heart healthy  Filed Weights   08/29/12 2150  Weight: 99.338 kg (219 lb)    History of present illness:  27 y.o. male with crohn's colitis, Dx at age 15, followed by [facility] phsycian, on chronic steroids and narcotics, returned to the ER as he continued to have abdominal pain,and now has bloody diarrhea. He was seen here a few days ago, and was given pain meds, but returned as he has bloody stool He doesn't think he can follow up in [facility] any longer because of distance, unavailability of appt, and cost. He has been on Remicade which worked well, but he had a severe reaction to it (throat swelling, rash....) so he is no longer on it. He was on Humira, but insurance no longer covers it. He has thus been on Methyltrexate and PO prednisone. He denied fever, chills, nausea or vomiting, lightheadedness or syncope, but admitted to having decrease appetite, loss of sex drive, and mood swings. Evalatuion in the ER included no leukocytosis, Hb of 12 g/DL, and normal electrolytes and renal fx tests. An abdominal CT showed colitis, but no abscess nor free air, no obstruction, and finding likely chronic. EDP consulted Dr Gerilyn Pilgrim of GI, and hosptitalist was asked to admit him for further Tx.   Hospital Course:  Bloody stools due to Crohn's colitis:  - on solumedrol and MTX.   - consult GI, C. Dif negative, start flagyl and antidiarrheal agent.  - started tolerating diet and bloody BM resolved. Follow up with GI. Changed steroids to  orals. - short term use of narcotics, needs to follow up with pain clinic.  Chronic pain syndrome:  - narcotics.  - was on methadone until couple of moths ago.   Chronic steroid use:  - has been on steroids for a prolong time, he probably has a degree of adrenal insufficiency.  - tapared as an outpatient slowly.   Microcytic anemia:  - Ferritin WNL, ESR high ferrtin acting like acute phase reactant. MCV borderline low  - start oral iron therapy.   Procedures:   (i.e. Studies not automatically included, echos, thoracentesis, etc; not x-rays)  Consultations:  CT abdomen and pelvis  Discharge Exam: Filed Vitals:   09/02/12 0600 09/02/12 1350 09/02/12 2100 09/03/12 0540  BP: 147/90 131/76 131/87 141/90  Pulse: 58 83 65 61  Temp: 97.5 F (36.4 C) 98 F (36.7 C) 98.4 F (36.9 C) 98.7 F (37.1 C)  TempSrc: Oral Oral Oral Oral  Resp: 18 18 18 18   Height:      Weight:      SpO2: 99% 100% 98% 98%    General: A&O x3 Cardiovascular: RRR Respiratory: good air movement CTA B/L  Discharge Instructions  Discharge Orders   Future Orders Complete By Expires     Diet - low sodium heart healthy  As directed     Increase activity slowly  As directed         Medication List    STOP taking these medications       ibuprofen 200  MG tablet  Commonly known as:  ADVIL,MOTRIN      TAKE these medications       acetaminophen 500 MG tablet  Commonly known as:  TYLENOL  Take 500-1,000 mg by mouth every 6 (six) hours as needed for pain.     dicyclomine 10 MG capsule  Commonly known as:  BENTYL  Take 1 capsule (10 mg total) by mouth 4 (four) times daily -  before meals and at bedtime.     ferrous sulfate 325 (65 FE) MG tablet  Take 1 tablet (325 mg total) by mouth 3 (three) times daily with meals.     Methotrexate Sodium (PF) 50 MG/2ML Soln  Inject 25 mg into the skin once a week. Inject 25 mg SQ into stomach weekly on mondays     morphine 15 MG 12 hr tablet  Commonly  known as:  MS CONTIN  Take 1 tablet (15 mg total) by mouth every 12 (twelve) hours.     predniSONE 20 MG tablet  Commonly known as:  DELTASONE  Take 2 tablets (40 mg total) by mouth daily with breakfast.       Allergies  Allergen Reactions  . Humira (Adalimumab) Anaphylaxis  . Remicade (Infliximab) Anaphylaxis and Swelling       Follow-up Information   Follow up with Yancey Flemings, MD. (office will call you this week with appt time - you need follow up in 2 weeks)    Contact information:   [address] [place] [address] [phone]        The results of significant diagnostics from this hospitalization (including imaging, microbiology, ancillary and laboratory) are listed below for reference.    Significant Diagnostic Studies: Ct Abdomen Pelvis W Contrast  08/29/2012   *RADIOLOGY REPORT*  Clinical Data: Nausea and vomiting.  Abdominal pain.  Crohn's disease.  CT ABDOMEN AND PELVIS WITH CONTRAST  Technique:  Multidetector CT imaging of the abdomen and pelvis was performed following the standard protocol during bolus administration of intravenous contrast.  Contrast: 50mL OMNIPAQUE IOHEXOL 300 MG/ML  SOLN, OMNIPAQUE IOHEXOL 300 MG/ML  SOLN  Comparison: Report of prior CT scan dated 10/07/2011 and radiographs dated 08/29/2012  Findings: There is diffuse thickening of the mucosa of the transverse and descending portions of the colon.  The mucosa of the terminal ileum demonstrates slight fatty infiltration.  Appendix is normal.  Stomach and small bowel are normal.  There is no free air or free fluid in the abdomen.  Liver, spleen, pancreas, adrenal glands, kidneys, and biliary tree are normal except for slight focal fatty infiltration adjacent to the falciform ligament in the liver.  No osseous abnormality.  IMPRESSION: Findings consistent with Crohn's colitis.  These findings were reported on the prior exam.  I suspect the findings are chronic.   Original Report Authenticated  By: Francene Boyers, M.D.   Dg Abd Acute W/chest  08/29/2012   *RADIOLOGY REPORT*  Clinical Data: Abdominal pain.  Nausea and vomiting.  Current history of Crohn's disease.  ACUTE ABDOMEN SERIES (ABDOMEN 2 VIEW & CHEST 1 VIEW)  Comparison: None.  Findings: No evidence of bowel obstruction.  Abnormal loop of jejunum in the left upper quadrant demonstrating marked wall thickening.  Scattered air-fluid levels in the left upper quadrant. No free intraperitoneal air on the erect image.  Phleboliths in both sides of the pelvis.  No visible opaque urinary tract calculi. Degenerative changes involving the sacroiliac joints.  Regional skeleton otherwise unremarkable.  Cardiomediastinal silhouette unremarkable.  Lungs clear. Bronchovascular markings normal.  Pulmonary vascularity normal.  No pneumothorax.  No pleural effusions.  IMPRESSION:  1.  Abnormal loop of jejunum in the left upper quadrant demonstrating marked wall thickening.  This is consistent with the given history of Crohn's disease. 2.  No evidence of bowel obstruction. 3.  Scattered air-fluid levels in the left upper quadrant likely due to localized reflex ileus. 4.  No acute cardiopulmonary disease.   Original Report Authenticated By: Hulan Saas, M.D.    Microbiology: Recent Results (from the past 240 hour(s))  CLOSTRIDIUM DIFFICILE BY PCR     Status: None   Collection Time    08/30/12  4:15 PM      Result Value Range Status   C difficile by pcr NEGATIVE  NEGATIVE Final     Labs: Basic Metabolic Panel:  Recent Labs Lab 08/29/12 1640 08/29/12 2320 09/03/12 0530  NA 138  --  137  K 3.7  --  3.9  CL 100  --  95*  CO2 24  --  30  GLUCOSE 99  --  107*  BUN 8  --  12  CREATININE 0.73 0.61 0.71  CALCIUM 9.9  --  10.2  MG  --  1.9  --    Liver Function Tests:  Recent Labs Lab 08/29/12 1640  AST 14  ALT 16  ALKPHOS 37*  BILITOT 0.4  PROT 8.5*  ALBUMIN 3.8    Recent Labs Lab 08/29/12 1640  LIPASE 20   No results  found for this basename: AMMONIA,  in the last 168 hours CBC:  Recent Labs Lab 08/29/12 1640 08/29/12 2320 09/02/12 1027  WBC 9.4 9.0 8.0  NEUTROABS 7.9*  --   --   HGB 12.5* 12.3* 13.6  HCT 38.4* 37.5* 40.9  MCV 79.2 79.3 79.4  PLT 500* 518* 540*   Cardiac Enzymes: No results found for this basename: CKTOTAL, CKMB, CKMBINDEX, TROPONINI,  in the last 168 hours BNP: BNP (last 3 results) No results found for this basename: PROBNP,  in the last 8760 hours CBG: No results found for this basename: GLUCAP,  in the last 168 hours     Signed:  Marinda Elk  [facility] 09/03/2012, 9:39 AM

## 2012-09-03 NOTE — Progress Notes (Signed)
Assessment unchanged. Pt verbalized understanding of dc instructions including meds to resume and follow up care with Dr. Marina Goodell through teach back. Scripts x 4 given as provided by MD. Pt able to tell nurse what My Chart is all about and plans to sign up soon. Discharged via wc to front entrance to meet awaiting vehicle to carry home. Accompanied by girlfriend and nurse.

## 2012-09-05 ENCOUNTER — Telehealth: Payer: Self-pay

## 2012-09-05 DIAGNOSIS — K50118 Crohn's disease of large intestine with other complication: Secondary | ICD-10-CM

## 2012-09-05 NOTE — Telephone Encounter (Signed)
Message copied by Chrystie Nose on Tue Sep 05, 2012 11:38 AM ------      Message from: Mike Gip S      Created: Fri Sep 01, 2012  3:54 PM      Regarding: Troy Ramirez- this pt has been in the hospital, will be Dr. Broadus John pt. He needs Cimzia. Had been on in the past briefly when he was apt at [facility] but his insurance wouldn't cover it  . According to the pt they cited pre-existing condition!  He has had an infusion rxn with Remicade and lost response to Humira.      Can you see if rep can sample him for a while . Will need to go thru initiation again with 0,2,4  Week  Regimen then 400mg  SQ monthly. Also , please send RX or get pre-auth started thru his insurance company- we may have to argue with them. Thanks ------

## 2012-09-05 NOTE — Telephone Encounter (Signed)
Left message for pt to call back.  09/06/12@9 :20am Left message for pt to call back.

## 2012-09-07 MED ORDER — CERTOLIZUMAB PEGOL 6 X 200 MG/ML ~~LOC~~ KIT: PACK | SUBCUTANEOUS | Status: DC

## 2012-09-07 NOTE — Telephone Encounter (Signed)
Spoke with pt and he is aware of OV appt with Dr. Marina Goodell. Pt gets his meds from [facility] on [address]. Rx sent in for Cimzia to start the process with Insurance co and prior [place].

## 2012-09-13 ENCOUNTER — Telehealth: Payer: Self-pay

## 2012-09-13 NOTE — Telephone Encounter (Signed)
Called pt to see if he can come in prior to his OV 09/19/12 with Dr. Marina Goodell for a TB skin test. Pt is to start on Cimzia and will need this to start. Rob the rep for Cimzia has ordered samples for this pt and they should be here tomorrow. Left message for pt to call back.

## 2012-09-19 ENCOUNTER — Encounter: Payer: Self-pay | Admitting: Internal Medicine

## 2012-09-19 ENCOUNTER — Ambulatory Visit (INDEPENDENT_AMBULATORY_CARE_PROVIDER_SITE_OTHER): Payer: Managed Care, Other (non HMO) | Admitting: Internal Medicine

## 2012-09-19 VITALS — BP 130/80 | HR 66 | Ht 74.0 in | Wt 221.6 lb

## 2012-09-19 DIAGNOSIS — K5090 Crohn's disease, unspecified, without complications: Secondary | ICD-10-CM

## 2012-09-19 DIAGNOSIS — M129 Arthropathy, unspecified: Secondary | ICD-10-CM

## 2012-09-19 DIAGNOSIS — M1990 Unspecified osteoarthritis, unspecified site: Secondary | ICD-10-CM

## 2012-09-19 MED ORDER — METHOTREXATE SODIUM CHEMO INJECTION (PF) 50 MG/2ML: 25.0000 mg | INTRAMUSCULAR | Status: DC

## 2012-09-19 MED ORDER — CERTOLIZUMAB PEGOL 2 X 200 MG ~~LOC~~ KIT: PACK | SUBCUTANEOUS | Status: DC

## 2012-09-19 MED ORDER — DULOXETINE HCL 60 MG PO CPEP: 60.0000 mg | ORAL_CAPSULE | Freq: Every day | ORAL | Status: DC

## 2012-09-19 MED ORDER — FOLIC ACID 1 MG PO TABS: 1.0000 mg | ORAL_TABLET | Freq: Every day | ORAL | Status: DC

## 2012-09-19 NOTE — Progress Notes (Signed)
HISTORY OF PRESENT ILLNESS:  Troy Ramirez is a 27 y.o. male with a history of Crohn's disease involving the colon and terminal ileum. He presents today post hospitalization to establish as a new patient. He is accompanied by his girlfriend. History is quite complex and he has been seen by at least 5 other gastroenterologists, prior to this visit, since his diagnosis in 2006. He was most recently evaluated at [facility] at [facility] inflammatory bowel disease clinic with Dr. Britt Bolognese. He wishes to establish care in [place] for the purposes of convenience. His problems have been complicated by multiple medication tolerance and/or variable efficacy and chronic steroid use. As well, chronic significant joint aches for which he was previously on significant doses of methadone. He has been seen at pain clinics. He was hospitalized at [facility] long 08/29/2012 through 09/03/2012 with complaints of abdominal pain, bloody diarrhea, and joint aches. CT scan showed evidence of chronic colitis without complicating features such as abscess or fistula. No radiographic significant small bowel disease aside from fatty infiltration of the terminal ileum. Laboratories were favorable including normal comprehensive metabolic panel with protein of 8.5 and albumin 3.8, mild anemia with hemoglobin 12.5 and MCV 79.2. Platelets 500,000. Sedimentation rate was elevated at 67. He was treated with steroids, antibiotics, analgesics, eventual advancement of diet and subsequent discharge. Discharge medications include prednisone 40 mg daily, methotrexate 25 no grams subcutaneous once weekly, iron sulfate, and Bentyl. Since hospital discharge, he reports doing well. He denies significant abdominal pain, problems with his bowel habits (3 formed bowel movements per day), or bleeding. He does report several steroid side effects including irritability, insomnia, increased appetite, and acne.  HIGHLIGHT SUMMARY OF  HISTORY 1. Crohn's colitis diagnosed in 2006 with involvement of the terminal ileum. Has had multiple prior upper endoscopies, flexible sigmoidoscopies, and colonoscopies in [place] [facility] and [place]. Last complete colonoscopy February 2013 demonstrated moderate colitis throughout the colon except the most distal 15 cm of the rectosigmoid region. Terminal ileitis was present. Most recent flexible sigmoidoscopy September 2013 revealing severe inflammation above the 25 cm region. 2. Initially treated with mesalamine and Imuran. Imuran discontinued due to a history of aggravating joint pains 3. History of response to infliximab with subsequent discontinuation due to significant infusion reaction 4. History of loss of response to Humira 5. Continue a steroid therapy since 2006 6. 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 7. History of chronic pain syndrome, previously on methadone. Weaned off methadone and placed on Cymbalta in [place]. 8. Initial evaluation with [facility] IBD clinic November 2013. Office evaluation reviewed. PPD and hepatitis B testing negative. At that time, the patient was treated with Cimzia, methotrexate, folic acid. In addition, steroid taper and gradual weaning from methadone and initiation of Cymbalta. Patient reports Cimzia was helpful, but apparently had insurance company denial after his induction phase. Last seen at [facility] in January 2014.  The patient wishes to be independent of steroids. He also would like to resume Cimzia, as this has helped him previously. We were able to get assistance from the pharmaceutical company for induction therapy and one dose thereafter. He would also like to resume Cymbalta, as this helped his pains without needing narcotics. He is no longer on folic acid but has continued on methotrexate that was started at [facility]. I also discussed with him the role of surgery.   REVIEW OF SYSTEMS:  All non-GI ROS negative except for sinus and allergy, back pain, depression,  fatigue, headaches, skipped heartbeat, itching, sleeping problems, ankle  swelling, increased thirst, acne  Past Medical History  Diagnosis Date  . Crohn disease   . Anemia     required transfusion of bood in 2008 or 2009  . 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   . Internal hemorrhoids     Past Surgical History  Procedure Laterality Date  . Neg hx      Social History Vonnie Ligman  reports that he has never smoked. He has never used smokeless tobacco. He reports that  drinks alcohol. He reports that he uses illicit drugs (Marijuana).  family history includes Breast cancer in his maternal grandmother; Diabetes in an unspecified family member; Heart disease in an unspecified family member; and Prostate cancer in his paternal grandfather.  There is no history of Colon cancer.  Allergies  Allergen Reactions  . Humira (Adalimumab) Anaphylaxis  . Remicade (Infliximab) Anaphylaxis and Swelling       PHYSICAL EXAMINATION: Vital signs: BP 130/80  Pulse 66  Ht 6\' 2"  (1.88 m)  Wt 221 lb 9.6 oz (100.517 kg)  BMI 28.44 kg/m2  Constitutional: generally well-appearing, no acute distress Psychiatric: alert and oriented x3, cooperative Eyes: extraocular movements intact, anicteric, conjunctiva pink Mouth: oral pharynx moist, no lesions Neck: supple no lymphadenopathy Cardiovascular: heart regular rate and rhythm, no murmur Lungs: clear to auscultation bilaterally Abdomen: soft, nontender, nondistended, no obvious ascites, no peritoneal signs, normal bowel sounds, no organomegaly Rectal: Omitted Extremities: no lower extremity edema bilaterally Skin: no lesions on visible extremities Neuro: No focal deficits.   ASSESSMENT:  #1. Chronic Crohn's colitis and ileitis. Very complicated history as outlined above. Currently with minimal symptoms on steroids and methotrexate. I discussed with him today in great detail my concerns regarding the  severity of his disease as well as the potential risks of the multiple agents that he is on for his disease. We also discussed the potential role of surgery. Finally, it is my desire to have him stay in types with the IBD clinic at [facility] to assist me in the management of his disease. 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 Chronic pain syndrome and history of chronic methadone use   PLAN:  #1. Continue methotrexate 25 mg subcutaneous once weekly. Medication refill that his request. #2. Decrease prednisone to 30 mg daily for 2 weeks then 20 mg daily until his followup in this clinic #3. Prescribe folic acid 1 mg daily #4. Begin Cimzia 400 mg subcutaneous today, in 2 weeks, in 4 weeks, then every 4 weeks thereafter if a good response after induction phase. Medication provided that no cost to the patient courtesy of the pharmaceutical representative. #5. Prescribe Cymbalta 60 mg daily. His previous dose, he reports #6. Office followup with me in 4 weeks. Contact the office in the interim for any questions or problems #7. Maintain contact with Dr. Gwenith Spitz at [facility]. I would like assistance managing this patient, particularly since he may need surgical intervention at some point. I have asked the patient to set up an appointment at [facility] with the IBD clinic and Dr. Gwenith Spitz in 3 months.

## 2012-09-19 NOTE — Patient Instructions (Addendum)
Per Dr. Marina Goodell, decrease Prednisone to 30mg  daily for 2 weeks, then 20mg  daily until follow up appointment.  We have sent the following medications to your pharmacy for you to pick up at your convenience: Methotrexate, folic acid, Cymbalta  We have given you samples of Cimzia:  Take 400mg  sq today, 400mg  in 2 weeks, 400mg  in 4 weeks, then 400mg  every 4 weeks.  You will have to come into the office to get more samples when you have completed what you have.  Please follow up with Dr. Marina Goodell in 4 weeks  Schedule an office visit with Dr.. Herfarth at [facility] in about 3 months

## 2012-09-20 ENCOUNTER — Telehealth: Payer: Self-pay

## 2012-09-20 NOTE — Telephone Encounter (Signed)
Faxed office note to Dr. Hassan Rowan office with instructions for someone in the office to call me to confirm it was received.

## 2012-09-20 NOTE — Telephone Encounter (Signed)
Message copied by Jeanine Luz on Wed Sep 20, 2012  3:01 PM ------      Message from: Hilarie Fredrickson      Created: Tue Sep 19, 2012  5:45 PM       Verlon Au, please fax a copy of my note to Dr. Britt Bolognese at [facility]. Please confirm with his nurse or secretary that it has been received. Thereafter, please document with a phone note in chart. Thanks ------

## 2012-09-25 NOTE — Telephone Encounter (Signed)
Per Darcey Nora RN pt was given TB skin test last week and was given Cimzia samples to start. Pt is to call the office back and let us know how he is doing and then he can receive the next sample.

## 2012-10-25 ENCOUNTER — Ambulatory Visit: Payer: Managed Care, Other (non HMO) | Admitting: Internal Medicine

## 2012-11-14 ENCOUNTER — Ambulatory Visit: Payer: Managed Care, Other (non HMO) | Admitting: Internal Medicine

## 2012-12-28 ENCOUNTER — Emergency Department (HOSPITAL_COMMUNITY): Payer: Managed Care, Other (non HMO)

## 2012-12-28 ENCOUNTER — Emergency Department (HOSPITAL_COMMUNITY)
Admission: EM | Admit: 2012-12-28 | Discharge: 2012-12-28 | Disposition: A | Discharge status: Home / Self Care | Payer: Managed Care, Other (non HMO) | Attending: Emergency Medicine | Admitting: Emergency Medicine

## 2012-12-28 ENCOUNTER — Encounter (HOSPITAL_COMMUNITY): Payer: Self-pay | Admitting: Emergency Medicine

## 2012-12-28 DIAGNOSIS — Z79899 Other long term (current) drug therapy: Secondary | ICD-10-CM | POA: Insufficient documentation

## 2012-12-28 DIAGNOSIS — Z8679 Personal history of other diseases of the circulatory system: Secondary | ICD-10-CM | POA: Insufficient documentation

## 2012-12-28 DIAGNOSIS — D649 Anemia, unspecified: Secondary | ICD-10-CM | POA: Insufficient documentation

## 2012-12-28 DIAGNOSIS — R05 Cough: Secondary | ICD-10-CM | POA: Insufficient documentation

## 2012-12-28 DIAGNOSIS — R071 Chest pain on breathing: Secondary | ICD-10-CM | POA: Insufficient documentation

## 2012-12-28 DIAGNOSIS — R059 Cough, unspecified: Secondary | ICD-10-CM | POA: Insufficient documentation

## 2012-12-28 DIAGNOSIS — IMO0002 Reserved for concepts with insufficient information to code with codable children: Secondary | ICD-10-CM | POA: Insufficient documentation

## 2012-12-28 DIAGNOSIS — R0789 Other chest pain: Secondary | ICD-10-CM

## 2012-12-28 DIAGNOSIS — Z8739 Personal history of other diseases of the musculoskeletal system and connective tissue: Secondary | ICD-10-CM | POA: Insufficient documentation

## 2012-12-28 DIAGNOSIS — Z8719 Personal history of other diseases of the digestive system: Secondary | ICD-10-CM | POA: Insufficient documentation

## 2012-12-28 LAB — COMPREHENSIVE METABOLIC PANEL
ALT: 13 U/L (ref 0–53)
Albumin: 4.1 g/dL (ref 3.5–5.2)
Alkaline Phosphatase: 44 U/L (ref 39–117)
Chloride: 100 mEq/L (ref 96–112)
Potassium: 3.5 mEq/L (ref 3.5–5.1)
Sodium: 137 mEq/L (ref 135–145)
Total Bilirubin: 0.3 mg/dL (ref 0.3–1.2)
Total Protein: 8.6 g/dL — ABNORMAL HIGH (ref 6.0–8.3)

## 2012-12-28 LAB — CBC WITH DIFFERENTIAL/PLATELET
Basophils Relative: 0 % (ref 0–1)
Eosinophils Absolute: 0.3 10*3/uL (ref 0.0–0.7)
Hemoglobin: 13.6 g/dL (ref 13.0–17.0)
Lymphs Abs: 1.1 10*3/uL (ref 0.7–4.0)
MCH: 26.5 pg (ref 26.0–34.0)
MCHC: 32.5 g/dL (ref 30.0–36.0)
Neutro Abs: 7.1 10*3/uL (ref 1.7–7.7)
Neutrophils Relative %: 80 % — ABNORMAL HIGH (ref 43–77)
Platelets: 378 10*3/uL (ref 150–400)
RBC: 5.14 MIL/uL (ref 4.22–5.81)

## 2012-12-28 MED ORDER — OXYCODONE-ACETAMINOPHEN 5-325 MG PO TABS
1.0000 | ORAL_TABLET | Freq: Once | ORAL | Status: AC
  Administered 2012-12-28: 1 via ORAL
  Filled 2012-12-28: qty 1

## 2012-12-28 MED ORDER — BENZONATATE 100 MG PO CAPS
100.0000 mg | ORAL_CAPSULE | Freq: Once | ORAL | Status: AC
  Administered 2012-12-28: 100 mg via ORAL
  Filled 2012-12-28: qty 1

## 2012-12-28 MED ORDER — BENZONATATE 100 MG PO CAPS: 100.0000 mg | ORAL_CAPSULE | Freq: Two times a day (BID) | ORAL | Status: DC | PRN

## 2012-12-28 MED ORDER — KETOROLAC TROMETHAMINE 60 MG/2ML IM SOLN
60.0000 mg | Freq: Once | INTRAMUSCULAR | Status: AC
  Administered 2012-12-28: 60 mg via INTRAMUSCULAR
  Filled 2012-12-28: qty 2

## 2012-12-28 MED ORDER — OXYCODONE-ACETAMINOPHEN 5-325 MG PO TABS: 1.0000 | ORAL_TABLET | Freq: Four times a day (QID) | ORAL | Status: DC | PRN

## 2012-12-28 NOTE — ED Provider Notes (Signed)
CSN: [id number]     Arrival date & time 12/28/12  1246 History   First MD Initiated Contact with Patient 12/28/12 1334     Chief Complaint  Patient presents with  . Abdominal Pain    chron's flare up   (Consider location/radiation/quality/duration/timing/severity/associated sxs/prior Treatment) Patient is a 27 y.o. male presenting with chest pain.  Chest Pain Pain location:  R chest Pain quality: sharp   Pain radiates to:  Does not radiate Pain severity:  Severe Onset quality:  Gradual Duration:  3 days Timing:  Constant Progression:  Worsening Chronicity:  New Context: breathing   Context comment:  Coughing.  5 weeks of cough.   Relieved by: certain positions. Worsened by:  Deep breathing, coughing and movement (sneezing) Ineffective treatments: ibuprofen, tylenol. Associated symptoms: cough   Associated symptoms: no abdominal pain, no fever, no nausea, no shortness of breath and not vomiting     Past Medical History  Diagnosis Date  . Crohn disease   . Anemia     required transfusion of bood in 2008 or 2009  . 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   . Internal hemorrhoids    Past Surgical History  Procedure Laterality Date  . Neg hx     Family History  Problem Relation Age of Onset  . Diabetes      Both sides  . Heart disease      both sides  . Breast cancer Maternal Grandmother   . Prostate cancer Paternal Grandfather   . Colon cancer Neg Hx    History  Substance Use Topics  . Smoking status: Never Smoker   . Smokeless tobacco: Never Used  . Alcohol Use: Yes     Comment: rarey    Review of Systems  Constitutional: Negative for fever.  Respiratory: Positive for cough. Negative for shortness of breath.   Cardiovascular: Positive for chest pain.  Gastrointestinal: Negative for nausea, vomiting, abdominal pain and diarrhea.  All other systems reviewed and are negative.    Allergies  Humira and  Remicade  Home Medications   Current Outpatient Rx  Name  Route  Sig  Dispense  Refill  . acetaminophen (TYLENOL) 500 MG tablet   Oral   Take 500-1,000 mg by mouth every 6 (six) hours as needed for pain.          . DULoxetine (CYMBALTA) 60 MG capsule   Oral   Take 1 capsule (60 mg total) by mouth daily.   30 capsule   11   . ferrous sulfate 325 (65 FE) MG tablet   Oral   Take 1 tablet (325 mg total) by mouth 3 (three) times daily with meals.   90 tablet   3   . folic acid (FOLVITE) 1 MG tablet   Oral   Take 1 tablet (1 mg total) by mouth daily.   30 tablet   11   . ibuprofen (ADVIL,MOTRIN) 200 MG tablet   Oral   Take 200 mg by mouth every 6 (six) hours as needed for pain.         . Methotrexate Sodium, PF, 50 MG/2ML SOLN   Subcutaneous   Inject 25 mg into the skin once a week. Inject 25 mg SQ into stomach weekly on mondays   4 mL   3   . OVER THE COUNTER MEDICATION   Oral   Take 2 tablets by mouth daily. [facility] over the counter  called Arthritin         . OVER THE COUNTER MEDICATION      5 patches daily. salanpas from [facility]         . predniSONE (DELTASONE) 20 MG tablet   Oral   Take 2 tablets (40 mg total) by mouth daily with breakfast.   30 tablet   0    There were no vitals taken for this visit. Physical Exam  Nursing note and vitals reviewed. Constitutional: He is oriented to person, place, and time. He appears well-developed and well-nourished. No distress.  HENT:  Head: Normocephalic and atraumatic.  Mouth/Throat: Oropharynx is clear and moist.  Eyes: Conjunctivae are normal. Pupils are equal, round, and reactive to light. No scleral icterus.  Neck: Neck supple.  Cardiovascular: Normal rate, regular rhythm, normal heart sounds and intact distal pulses.   No murmur heard. Pulmonary/Chest: Effort normal and breath sounds normal. No stridor. No respiratory distress. He has no wheezes. He has no rales.  Point tenderness on palpation of right  lateral/anterior chest pain  Abdominal: Soft. He exhibits no distension. There is no tenderness.  Musculoskeletal: Normal range of motion. He exhibits no edema and no tenderness.  Neurological: He is alert and oriented to person, place, and time.  Skin: Skin is warm and dry. No rash noted.  Psychiatric: He has a normal mood and affect. His behavior is normal.    ED Course  Procedures (including critical care time) Labs Review Labs Reviewed  CBC WITH DIFFERENTIAL - Abnormal; Notable for the following:    Neutrophils Relative % 80 (*)    All other components within normal limits  COMPREHENSIVE METABOLIC PANEL - Abnormal; Notable for the following:    Glucose, Bld 100 (*)    Total Protein 8.6 (*)    All other components within normal limits  LIPASE, BLOOD  URINALYSIS, ROUTINE W REFLEX MICROSCOPIC   Imaging Review No results found.  EKG Interpretation     Ventricular Rate:  65 PR Interval:  117 QRS Duration: 90 QT Interval:  406 QTC Calculation: 422 R Axis:   49 Text Interpretation:  Sinus rhythm Borderline short PR interval No old tracing to compare            MDM   1. Chest wall pain    27 yo male with hx of Crohn's presenting with right chest wall pain in setting of a cough.  He had a prolonged coughing fit the other night, which he thinks may have started his pain.  His pain is not consistent with ACS or Dissection.  He is low risk for PE and PERC negative.    He also reports having some bloody stools earlier in the week which have now resolved.  He also reports chronic weather related joint pains which are starting to flare up.  He states these issues are not why he is in the ED today.    3:33 PM pain not improved with just toradol.  Will give percocet.  CXR shows no signs of PNA.  No leukocytosis.  Blood counts normal.  I think he strained his chest wall from coughing.  Will dc with cough meds, short course of narcotics. Advised conservative use of NSAIDs given  his prior GI bleeding.    Candyce Churn, MD 12/28/12 1538

## 2012-12-28 NOTE — ED Notes (Signed)
Bed: WA16 Expected date:  Expected time:  Means of arrival:  Comments: Triage 1 

## 2012-12-28 NOTE — ED Notes (Addendum)
Pt has hx of Crohn's dz and is having abd pain and right sided rib pain with coughing and deep breathes. Pt c/o n/v/d.

## 2012-12-28 NOTE — ED Notes (Addendum)
Per pt has hx of Crohn's and has had n/v/d x1 month. Pt reports 1 episode of vomiting today and 2 episodes of diarrhea today. Pt states stool was more formed today then over the past month. Pt reports have bleeding in stool but has gone away for a week now. Pt reports sharp right sided abd pain that eases with fetal position.

## 2012-12-28 NOTE — Progress Notes (Signed)
   CARE MANAGEMENT ED NOTE 12/28/2012  Patient:  Troy Ramirez, Troy Ramirez   Account Number:  [id number]  Date Initiated:  12/28/2012  Documentation initiated by:  Edd Arbour  Subjective/Objective Assessment:   27 yr old male [employer] managed hx crohn's disease abdominal pain and right side rib pain No pcp listed in EPIC     Subjective/Objective Assessment Detail:   Pt confirms [employer] coverage and no pcp States he has been to a dr in [place] but does not know the name of the dr     Action/Plan:   CM provided pt with a list of [employer] managed providers [place] [address]   Action/Plan Detail:   Anticipated DC Date:  12/28/2012     Status Recommendation to Physician:   Result of Recommendation:    Other ED Services  Consult Working Plan    DC Planning Services  Other  PCP issues  Outpatient Services - Pt will follow up    Choice offered to / List presented to:            Status of service:  Completed, signed off  ED Comments:   ED Comments Detail:

## 2013-01-11 ENCOUNTER — Other Ambulatory Visit: Payer: Self-pay

## 2013-08-27 ENCOUNTER — Encounter (HOSPITAL_COMMUNITY): Payer: Self-pay | Admitting: Emergency Medicine

## 2013-08-27 ENCOUNTER — Inpatient Hospital Stay (HOSPITAL_COMMUNITY)
Admission: EM | Admit: 2013-08-27 | Discharge: 2013-09-03 | DRG: 387 | Disposition: A | Discharge status: Home / Self Care | Payer: 59 | Attending: Internal Medicine | Admitting: Internal Medicine

## 2013-08-27 DIAGNOSIS — K50119 Crohn's disease of large intestine with unspecified complications: Secondary | ICD-10-CM

## 2013-08-27 DIAGNOSIS — R1013 Epigastric pain: Secondary | ICD-10-CM

## 2013-08-27 DIAGNOSIS — D638 Anemia in other chronic diseases classified elsewhere: Secondary | ICD-10-CM | POA: Diagnosis present

## 2013-08-27 DIAGNOSIS — Z9119 Patient's noncompliance with other medical treatment and regimen: Secondary | ICD-10-CM

## 2013-08-27 DIAGNOSIS — IMO0002 Reserved for concepts with insufficient information to code with codable children: Secondary | ICD-10-CM

## 2013-08-27 DIAGNOSIS — G894 Chronic pain syndrome: Secondary | ICD-10-CM

## 2013-08-27 DIAGNOSIS — R1010 Upper abdominal pain, unspecified: Secondary | ICD-10-CM

## 2013-08-27 DIAGNOSIS — R1030 Lower abdominal pain, unspecified: Secondary | ICD-10-CM

## 2013-08-27 DIAGNOSIS — F1210 Cannabis abuse, uncomplicated: Secondary | ICD-10-CM | POA: Diagnosis present

## 2013-08-27 DIAGNOSIS — Z803 Family history of malignant neoplasm of breast: Secondary | ICD-10-CM

## 2013-08-27 DIAGNOSIS — I10 Essential (primary) hypertension: Secondary | ICD-10-CM

## 2013-08-27 DIAGNOSIS — R1084 Generalized abdominal pain: Secondary | ICD-10-CM

## 2013-08-27 DIAGNOSIS — Z91199 Patient's noncompliance with other medical treatment and regimen due to unspecified reason: Secondary | ICD-10-CM

## 2013-08-27 DIAGNOSIS — D649 Anemia, unspecified: Secondary | ICD-10-CM | POA: Diagnosis present

## 2013-08-27 DIAGNOSIS — D72829 Elevated white blood cell count, unspecified: Secondary | ICD-10-CM

## 2013-08-27 DIAGNOSIS — M129 Arthropathy, unspecified: Secondary | ICD-10-CM | POA: Diagnosis present

## 2013-08-27 DIAGNOSIS — T380X5A Adverse effect of glucocorticoids and synthetic analogues, initial encounter: Secondary | ICD-10-CM | POA: Diagnosis present

## 2013-08-27 DIAGNOSIS — R109 Unspecified abdominal pain: Secondary | ICD-10-CM | POA: Diagnosis present

## 2013-08-27 DIAGNOSIS — K5010 Crohn's disease of large intestine without complications: Principal | ICD-10-CM | POA: Diagnosis present

## 2013-08-27 DIAGNOSIS — K921 Melena: Secondary | ICD-10-CM

## 2013-08-27 DIAGNOSIS — M2550 Pain in unspecified joint: Secondary | ICD-10-CM | POA: Diagnosis present

## 2013-08-27 DIAGNOSIS — K50118 Crohn's disease of large intestine with other complication: Secondary | ICD-10-CM

## 2013-08-27 LAB — COMPREHENSIVE METABOLIC PANEL
ALT: 8 U/L (ref 0–53)
AST: 14 U/L (ref 0–37)
Albumin: 3.3 g/dL — ABNORMAL LOW (ref 3.5–5.2)
Alkaline Phosphatase: 38 U/L — ABNORMAL LOW (ref 39–117)
BUN: 6 mg/dL (ref 6–23)
CALCIUM: 9.4 mg/dL (ref 8.4–10.5)
CO2: 27 mEq/L (ref 19–32)
CREATININE: 0.72 mg/dL (ref 0.50–1.35)
Chloride: 97 mEq/L (ref 96–112)
GFR calc Af Amer: >90 mL/min (ref >90)
GFR calc non Af Amer: >90 mL/min (ref >90)
Glucose, Bld: 129 mg/dL — ABNORMAL HIGH (ref 70–99)
Potassium: 4 mEq/L (ref 3.7–5.3)
Sodium: 138 mEq/L (ref 137–147)
TOTAL PROTEIN: 8.1 g/dL (ref 6.0–8.3)
Total Bilirubin: <0.2 mg/dL — ABNORMAL LOW (ref 0.3–1.2)

## 2013-08-27 LAB — CBC WITH DIFFERENTIAL/PLATELET
BASOS ABS: 0 10*3/uL (ref 0.0–0.1)
Basophils Relative: 0 % (ref 0–1)
Eosinophils Absolute: 0.1 10*3/uL (ref 0.0–0.7)
Eosinophils Relative: 1 % (ref 0–5)
HCT: 32.4 % — ABNORMAL LOW (ref 39.0–52.0)
Hemoglobin: 9.7 g/dL — ABNORMAL LOW (ref 13.0–17.0)
LYMPHS ABS: 0.9 10*3/uL (ref 0.7–4.0)
Lymphocytes Relative: 8 % — ABNORMAL LOW (ref 12–46)
MCH: 19 pg — AB (ref 26.0–34.0)
MCHC: 29.9 g/dL — ABNORMAL LOW (ref 30.0–36.0)
MCV: 63.5 fL — AB (ref 78.0–100.0)
Monocytes Absolute: 0.4 10*3/uL (ref 0.1–1.0)
Monocytes Relative: 4 % (ref 3–12)
Neutro Abs: 9.5 10*3/uL — ABNORMAL HIGH (ref 1.7–7.7)
Neutrophils Relative %: 87 % — ABNORMAL HIGH (ref 43–77)
PLATELETS: 622 10*3/uL — AB (ref 150–400)
RBC: 5.1 MIL/uL (ref 4.22–5.81)
RDW: 17.1 % — AB (ref 11.5–15.5)
WBC: 10.9 10*3/uL — AB (ref 4.0–10.5)

## 2013-08-27 LAB — URINALYSIS, ROUTINE W REFLEX MICROSCOPIC
Glucose, UA: NEGATIVE mg/dL
Hgb urine dipstick: NEGATIVE
Ketones, ur: NEGATIVE mg/dL
LEUKOCYTES UA: NEGATIVE
NITRITE: NEGATIVE
PH: 6 (ref 5.0–8.0)
Protein, ur: NEGATIVE mg/dL
Specific Gravity, Urine: 1.038 — ABNORMAL HIGH (ref 1.005–1.030)
Urobilinogen, UA: 0.2 mg/dL (ref 0.0–1.0)

## 2013-08-27 LAB — I-STAT CG4 LACTIC ACID, ED: Lactic Acid, Venous: 1.14 mmol/L (ref 0.5–2.2)

## 2013-08-27 LAB — LIPASE, BLOOD: Lipase: 39 U/L (ref 11–59)

## 2013-08-27 LAB — POC OCCULT BLOOD, ED: Fecal Occult Bld: POSITIVE — AB

## 2013-08-27 MED ORDER — ONDANSETRON HCL 4 MG PO TABS: 4.0000 mg | ORAL_TABLET | Freq: Four times a day (QID) | ORAL | Status: DC | PRN

## 2013-08-27 MED ORDER — METHYLPREDNISOLONE SODIUM SUCC 125 MG IJ SOLR
125.0000 mg | Freq: Once | INTRAMUSCULAR | Status: AC
  Administered 2013-08-27: 125 mg via INTRAVENOUS
  Filled 2013-08-27: qty 2

## 2013-08-27 MED ORDER — HYDROMORPHONE HCL PF 1 MG/ML IJ SOLN
1.0000 mg | Freq: Once | INTRAMUSCULAR | Status: AC
  Administered 2013-08-27: 1 mg via INTRAVENOUS
  Filled 2013-08-27: qty 1

## 2013-08-27 MED ORDER — ONDANSETRON HCL 4 MG/2ML IJ SOLN
4.0000 mg | Freq: Four times a day (QID) | INTRAMUSCULAR | Status: DC | PRN
  Administered 2013-08-28 – 2013-09-01 (×7): 4 mg via INTRAVENOUS
  Filled 2013-08-27 (×7): qty 2

## 2013-08-27 MED ORDER — SODIUM CHLORIDE 0.9 % IV SOLN
INTRAVENOUS | Status: DC
  Administered 2013-08-27: 23:00:00 via INTRAVENOUS

## 2013-08-27 MED ORDER — ACETAMINOPHEN 325 MG PO TABS: 650.0000 mg | ORAL_TABLET | Freq: Four times a day (QID) | ORAL | Status: DC | PRN

## 2013-08-27 MED ORDER — HYDROMORPHONE HCL PF 1 MG/ML IJ SOLN
1.0000 mg | INTRAMUSCULAR | Status: DC | PRN
  Administered 2013-08-28 (×6): 1 mg via INTRAVENOUS
  Filled 2013-08-27 (×7): qty 1

## 2013-08-27 MED ORDER — SODIUM CHLORIDE 0.9 % IV BOLUS (SEPSIS)
1000.0000 mL | Freq: Once | INTRAVENOUS | Status: AC
  Administered 2013-08-27: 1000 mL via INTRAVENOUS

## 2013-08-27 MED ORDER — ONDANSETRON HCL 4 MG/2ML IJ SOLN
4.0000 mg | Freq: Once | INTRAMUSCULAR | Status: AC
  Administered 2013-08-27: 4 mg via INTRAVENOUS
  Filled 2013-08-27: qty 2

## 2013-08-27 MED ORDER — SODIUM CHLORIDE 0.9 % IV SOLN
INTRAVENOUS | Status: AC
  Administered 2013-08-28 (×2): via INTRAVENOUS

## 2013-08-27 MED ORDER — ACETAMINOPHEN 650 MG RE SUPP: 650.0000 mg | Freq: Four times a day (QID) | RECTAL | Status: DC | PRN

## 2013-08-27 NOTE — ED Provider Notes (Signed)
CSN: [id number]     Arrival date & time 08/27/13  1517 History   First MD Initiated Contact with Patient 08/27/13 1847     Chief Complaint  Patient presents with  . Crohn's Disease     (Consider location/radiation/quality/duration/timing/severity/associated sxs/prior Treatment) HPI Comments: Patient with history of Crohn's disease, chronic narcotics use, marijuana use -- presents with complaint of worsening abdominal pain. Abdominal pain is generalized and does not radiate. Pain is severe. It is similar to previous pain with Crohn's flare. Patient states that he has "gotten his hands on" narcotic pain medications including morphine and oxycodone have not been prescribed to him. He has been using these for pain control. Patient states that he stopped taking these 2 days ago. He has not had nausea and vomiting and has been drinking a lot of Gatorade. She's been having watery stools without blood. No urinary symptoms. No history of prior resection or complicated abscess. He does not think that he is withdrawing from narcotics. The onset of this condition was incidious. The course is constant. Aggravating factors: none. Alleviating factors: none.    The history is provided by the patient and medical records.    Past Medical History  Diagnosis Date  . Crohn disease   . Anemia     required transfusion of bood in 2008 or 2009  . 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   . Internal hemorrhoids    Past Surgical History  Procedure Laterality Date  . Neg hx     Family History  Problem Relation Age of Onset  . Diabetes      Both sides  . Heart disease      both sides  . Breast cancer Maternal Grandmother   . Prostate cancer Paternal Grandfather   . Colon cancer Neg Hx    History  Substance Use Topics  . Smoking status: Never Smoker   . Smokeless tobacco: Never Used  . Alcohol Use: Yes     Comment: rarey    Review of Systems   Constitutional: Negative for fever.  HENT: Negative for rhinorrhea and sore throat.   Eyes: Negative for redness.  Respiratory: Negative for cough.   Cardiovascular: Negative for chest pain.  Gastrointestinal: Positive for abdominal pain and diarrhea. Negative for nausea and vomiting.  Genitourinary: Negative for dysuria.  Musculoskeletal: Negative for myalgias.  Skin: Negative for rash.  Neurological: Negative for headaches.   Allergies  Humira and Remicade  Home Medications   Prior to Admission medications   Medication Sig Start Date End Date Taking? Authorizing Provider  acetaminophen (TYLENOL) 500 MG tablet Take 500-1,000 mg by mouth every 6 (six) hours as needed for pain.     Historical Provider, MD  benzonatate (TESSALON) 100 MG capsule Take 1 capsule (100 mg total) by mouth 2 (two) times daily as needed for cough. 12/28/12   Candyce ChurnJohn David Wofford III, MD  DULoxetine (CYMBALTA) 60 MG capsule Take 1 capsule (60 mg total) by mouth daily. 09/19/12   Hilarie FredricksonJohn N Perry, MD  ferrous sulfate 325 (65 FE) MG tablet Take 1 tablet (325 mg total) by mouth 3 (three) times daily with meals. 09/03/12   Marinda ElkAbraham Feliz Ortiz, MD  folic acid (FOLVITE) 1 MG tablet Take 1 tablet (1 mg total) by mouth daily. 09/19/12   Hilarie FredricksonJohn N Perry, MD  ibuprofen (ADVIL,MOTRIN) 200 MG tablet Take 200 mg by mouth every 6 (six) hours as needed for pain.  Historical Provider, MD  Methotrexate Sodium, PF, 50 MG/2ML SOLN Inject 25 mg into the skin once a week. Inject 25 mg SQ into stomach weekly on mondays 09/19/12   Hilarie Fredrickson, MD  OVER THE COUNTER MEDICATION Take 2 tablets by mouth daily. [facility] over the counter called Arthritin    Historical Provider, MD  OVER THE COUNTER MEDICATION 5 patches daily. salanpas from [facility]    Historical Provider, MD  oxyCODONE-acetaminophen (PERCOCET/ROXICET) 5-325 MG per tablet Take 1-2 tablets by mouth every 6 (six) hours as needed for pain. 12/28/12   Candyce Churn III, MD  predniSONE (DELTASONE)  20 MG tablet Take 2 tablets (40 mg total) by mouth daily with breakfast. 09/03/12   Marinda Elk, MD   BP 177/76  Pulse 77  Temp(Src) 97.6 F (36.4 C) (Oral)  Resp 18  Ht 6\' 2"  (1.88 m)  Wt 170 lb (77.111 kg)  BMI 21.82 kg/m2  SpO2 98%  Physical Exam  Nursing note and vitals reviewed. Constitutional: He appears well-developed and well-nourished.  Pallor.  HENT:  Head: Normocephalic and atraumatic.  Eyes: Conjunctivae are normal. Right eye exhibits no discharge. Left eye exhibits no discharge.  Neck: Normal range of motion. Neck supple.  Cardiovascular: Normal rate, regular rhythm and normal heart sounds.   Pulmonary/Chest: Effort normal and breath sounds normal. No respiratory distress. He has no wheezes. He has no rales.  Abdominal: Soft. There is tenderness. There is no rebound and no guarding.  Tenderness generally  Genitourinary: Rectal exam shows no external hemorrhoid, no internal hemorrhoid, no fissure, no mass, no tenderness and anal tone normal. Guaiac positive stool.  Maroon stool on glove, soft  Neurological: He is alert.  Skin: Skin is warm and dry.  Psychiatric: He has a normal mood and affect.    ED Course  Procedures (including critical care time) Labs Review Labs Reviewed  CBC WITH DIFFERENTIAL - Abnormal; Notable for the following:    WBC 10.9 (*)    Hemoglobin 9.7 (*)    HCT 32.4 (*)    MCV 63.5 (*)    MCH 19.0 (*)    MCHC 29.9 (*)    RDW 17.1 (*)    Platelets 622 (*)    Neutrophils Relative % 87 (*)    Lymphocytes Relative 8 (*)    Neutro Abs 9.5 (*)    All other components within normal limits  COMPREHENSIVE METABOLIC PANEL - Abnormal; Notable for the following:    Glucose, Bld 129 (*)    Albumin 3.3 (*)    Alkaline Phosphatase 38 (*)    Total Bilirubin <0.2 (*)    All other components within normal limits  URINALYSIS, ROUTINE W REFLEX MICROSCOPIC - Abnormal; Notable for the following:    Specific Gravity, Urine 1.038 (*)    Bilirubin  Urine SMALL (*)    All other components within normal limits  LIPASE, BLOOD  OCCULT BLOOD X 1 CARD TO LAB, STOOL  I-STAT CG4 LACTIC ACID, ED    Imaging Review No results found.   EKG Interpretation None      7:16 PM Patient seen and examined. Work-up initiated. Medications ordered. Mild leukocytosis. I wonder if there is an element of narcotics withdrawal here. He is pale. Lactate sent. Fluids and pain medications ordered.   Vital signs reviewed and are as follows: Filed Vitals:   08/27/13 1839  BP: 177/76  Pulse: 77  Temp: 97.6 F (36.4 C)  Resp: 18   10:34 PM Patient seen  previously with Dr. Patria Mane.   Will admit for Crohn's flare and intractable pain.   Spoke with Dr. Toniann Fail who will see.   10:53 PM Guiac pos stool.    MDM   Final diagnoses:  Crohn's colitis, without complications  Chronic pain syndrome   Admit for worsening pain in patient with poorly controlled Crohn's disease.     Renne Crigler, PA-C 08/27/13 2236  Renne Crigler, PA-C 08/27/13 [phone]

## 2013-08-27 NOTE — H&P (Signed)
[facility] History and Physical  Troy MoatSean Ramirez AVW:[id number] DOB: 1986-01-03 DOA: 08/27/2013  Referring physician: ER physician. PCP: No PCP Per Patient   Chief Complaint: Abdominal pain with nausea vomiting and diarrhea.  HPI: Troy Ramirez is a 28 y.o. male with history of Crohn's disease presently on no medication since last December as patient was not able to afford presents to the ER because of worsening abdominal pain over the last one month with subjective feeling of fever and chills at lower extremity joint pains. Over the last few days patient also has been having nausea vomiting and multiple episodes of diarrhea. In the ER patient was found to be mildly febrile and anemic and stool for occult blood was positive. Patient did not notice any gross bleeding. Patient used to use NSAIDs but has not recently used. Patient was given one dose of IV methylprednisolone and admitted for possible Crohn's exacerbation. 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 Patient otherwise denies any chest pain or shortness of breath. 2 months ago he had upper respiratory tract infection and was treated with antibiotics for 10 days and last 2 weeks he was having pink eye in both eyes which has resolved with hot fomentation. Since patient was not able to for medications patient states that he was on a gluten-free diet by himself and was taking some marijuana.  Review of Systems: As presented in the history of presenting illness, rest negative.  Past Medical History  Diagnosis Date  . Crohn disease   . Anemia     required transfusion of bood in 2008 or 2009  . 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   . Internal hemorrhoids    Past Surgical History  Procedure Laterality Date  . Neg hx     Social History:  reports that he has never smoked. He has never used smokeless tobacco. He reports that he drinks alcohol. He reports that he uses illicit drugs  (Marijuana). Where does patient live home. Can patient participate in ADLs? Yes.  Allergies  Allergen Reactions  . Humira [Adalimumab] Anaphylaxis  . Remicade [Infliximab] Anaphylaxis and Swelling    Family History:  Family History  Problem Relation Age of Onset  . Diabetes      Both sides  . Heart disease      both sides  . Breast cancer Maternal Grandmother   . Prostate cancer Paternal Grandfather   . Colon cancer Neg Hx       Prior to Admission medications   Medication Sig Start Date End Date Taking? Authorizing Provider  acetaminophen (TYLENOL) 500 MG tablet Take 500-1,000 mg by mouth every 6 (six) hours as needed for pain.    Yes Historical Provider, MD  vitamin C (ASCORBIC ACID) 500 MG tablet Take 1,000 mg by mouth daily.   Yes Historical Provider, MD    Physical Exam: Filed Vitals:   08/27/13 1534 08/27/13 1839 08/27/13 2158 08/27/13 2339  BP: 127/90 177/76 160/79 166/98  Pulse: 104 77 92 67  Temp: 98.5 F (36.9 C) 97.6 F (36.4 C) 99.4 F (37.4 C) 97.9 F (36.6 C)  TempSrc: Oral Oral Oral Oral  Resp: 18 18 20 20   Height:  6\' 2"  (1.88 m)  6\' 2"  (1.88 m)  Weight:  77.111 kg (170 lb)  75.433 kg (166 lb 4.8 oz)  SpO2: 100% 98% 100% 100%     General:  Well-developed and nourished.  Eyes: No congestion no  pallor or icterus.  ENT: No discharge from the ears eyes nose mouth.  Neck: No mass felt.  Cardiovascular: S1-S2 heard.  Respiratory: No rhonchi or crepitations.  Abdomen: Soft nontender bowel sounds present. No guarding or rigidity.  Skin: No rash.  Musculoskeletal: No edema.  Psychiatric: Appears normal.  Neurologic: Alert awake oriented to time place and person. Moves all extremities.  Labs on Admission:  Basic Metabolic Panel:  Recent Labs Lab 08/27/13 1535  NA 138  K 4.0  CL 97  CO2 27  GLUCOSE 129*  BUN 6  CREATININE 0.72  CALCIUM 9.4   Liver Function Tests:  Recent Labs Lab 08/27/13 1535  AST 14  ALT 8  ALKPHOS 38*   BILITOT <0.2*  PROT 8.1  ALBUMIN 3.3*    Recent Labs Lab 08/27/13 1535  LIPASE 39   No results found for this basename: AMMONIA,  in the last 168 hours CBC:  Recent Labs Lab 08/27/13 1535  WBC 10.9*  NEUTROABS 9.5*  HGB 9.7*  HCT 32.4*  MCV 63.5*  PLT 622*   Cardiac Enzymes: No results found for this basename: CKTOTAL, CKMB, CKMBINDEX, TROPONINI,  in the last 168 hours  BNP (last 3 results) No results found for this basename: PROBNP,  in the last 8760 hours CBG: No results found for this basename: GLUCAP,  in the last 168 hours  Radiological Exams on Admission: No results found.   Assessment/Plan Principal Problem:   Abdominal pain Active Problems:   Crohn's colitis   Joint pain   Anemia   1. Abdominal pain with nausea vomiting and diarrhea - probably secondary to Crohn's exacerbation. CT abdomen and pelvis is pending. Patient was given one dose of IV methylprednisolone. For now I have kept patient n.p.o. with IV fluids and pain relief medications. Consult GI in a.m. Patient's joint pains may be related to Crohn's. Since patient was complaining of fever and chills blood cultures have been ordered and are pending.  2. Anemia with stool for occult blood positive - most likely secondary to Crohn's. Closely follow CBC. 3. Elevated blood pressure - closely follow blood pressure trends. For now I have placed patient on when necessary IV hydralazine for systolic blood pressure more than 180.    Code Status: Full code.  Family Communication: Patient's mother.  Disposition Plan: Admit to inpatient.    Nicklas Mcsweeney N. [facility] Pager [phone].  If 7PM-7AM, please contact night-coverage [email or web address] Password [facility] 08/27/2013, 11:45 PM

## 2013-08-27 NOTE — ED Notes (Signed)
Pt is having crohn's dz flare up, pt states it has been going on for a couple of months. Pt has been having watery diarrhea.

## 2013-08-27 NOTE — ED Provider Notes (Signed)
Medical screening examination/treatment/procedure(s) were conducted as a shared visit with non-physician practitioner(s) and myself.  I personally evaluated the patient during the encounter.   EKG Interpretation None      Worsening pain. Likely Crohn's flare however complex given chronic pain syndrome as well.  He is very forthcoming with regarding his use of narcotics in the past which have helped control his symptoms.  Patient be admitted for pain control.  Low suspicion for abscess at this time.  Will withhold imaging.  Lyanne CoKevin M Campos, MD 08/27/13 [phone]

## 2013-08-28 ENCOUNTER — Inpatient Hospital Stay (HOSPITAL_COMMUNITY): Payer: 59

## 2013-08-28 DIAGNOSIS — K921 Melena: Secondary | ICD-10-CM

## 2013-08-28 DIAGNOSIS — K5010 Crohn's disease of large intestine without complications: Principal | ICD-10-CM

## 2013-08-28 LAB — COMPREHENSIVE METABOLIC PANEL
ALT: 10 U/L (ref 0–53)
AST: 17 U/L (ref 0–37)
Albumin: 3.3 g/dL — ABNORMAL LOW (ref 3.5–5.2)
Alkaline Phosphatase: 38 U/L — ABNORMAL LOW (ref 39–117)
BUN: 7 mg/dL (ref 6–23)
CHLORIDE: 101 meq/L (ref 96–112)
CO2: 23 mEq/L (ref 19–32)
Calcium: 9.2 mg/dL (ref 8.4–10.5)
Creatinine, Ser: 0.62 mg/dL (ref 0.50–1.35)
GFR calc non Af Amer: >90 mL/min (ref >90)
GLUCOSE: 131 mg/dL — AB (ref 70–99)
POTASSIUM: 3.7 meq/L (ref 3.7–5.3)
Sodium: 138 mEq/L (ref 137–147)
Total Bilirubin: <0.2 mg/dL — ABNORMAL LOW (ref 0.3–1.2)
Total Protein: 8 g/dL (ref 6.0–8.3)

## 2013-08-28 LAB — CBC
HCT: 30.7 % — ABNORMAL LOW (ref 39.0–52.0)
HCT: 30.5 % — ABNORMAL LOW (ref 39.0–52.0)
HEMOGLOBIN: 9.4 g/dL — AB (ref 13.0–17.0)
Hemoglobin: 9.4 g/dL — ABNORMAL LOW (ref 13.0–17.0)
MCH: 19.4 pg — ABNORMAL LOW (ref 26.0–34.0)
MCH: 19.3 pg — ABNORMAL LOW (ref 26.0–34.0)
MCHC: 30.6 g/dL (ref 30.0–36.0)
MCHC: 30.8 g/dL (ref 30.0–36.0)
MCV: 62.5 fL — ABNORMAL LOW (ref 78.0–100.0)
MCV: 63.4 fL — AB (ref 78.0–100.0)
PLATELETS: 577 10*3/uL — AB (ref 150–400)
Platelets: 563 10*3/uL — ABNORMAL HIGH (ref 150–400)
RBC: 4.84 MIL/uL (ref 4.22–5.81)
RBC: 4.88 MIL/uL (ref 4.22–5.81)
RDW: 17.2 % — ABNORMAL HIGH (ref 11.5–15.5)
RDW: 17 % — ABNORMAL HIGH (ref 11.5–15.5)
WBC: 12.3 10*3/uL — AB (ref 4.0–10.5)
WBC: 12.3 10*3/uL — AB (ref 4.0–10.5)

## 2013-08-28 LAB — FERRITIN: Ferritin: 12 ng/mL — ABNORMAL LOW (ref 22–322)

## 2013-08-28 LAB — CLOSTRIDIUM DIFFICILE BY PCR: Toxigenic C. Difficile by PCR: NEGATIVE

## 2013-08-28 LAB — FOLATE: Folate: 10.2 ng/mL

## 2013-08-28 LAB — TYPE AND SCREEN
ABO/RH(D): B POS
Antibody Screen: NEGATIVE

## 2013-08-28 LAB — GI PATHOGEN PANEL BY PCR, STOOL
C DIFFICILE TOXIN A/B: NEGATIVE
CAMPYLOBACTER BY PCR: NEGATIVE
Cryptosporidium by PCR: NEGATIVE
E COLI (ETEC) LT/ST: NEGATIVE
E coli (STEC): NEGATIVE
E coli 0157 by PCR: NEGATIVE
G lamblia by PCR: NEGATIVE
Norovirus GI/GII: NEGATIVE
ROTAVIRUS A BY PCR: NEGATIVE
SALMONELLA BY PCR: NEGATIVE
Shigella by PCR: NEGATIVE

## 2013-08-28 LAB — GLUCOSE, CAPILLARY
GLUCOSE-CAPILLARY: 95 mg/dL (ref 70–99)
Glucose-Capillary: 124 mg/dL — ABNORMAL HIGH (ref 70–99)
Glucose-Capillary: 136 mg/dL — ABNORMAL HIGH (ref 70–99)

## 2013-08-28 LAB — IRON AND TIBC
Iron: <10 ug/dL — ABNORMAL LOW (ref 42–135)
UIBC: 321 ug/dL (ref 125–400)

## 2013-08-28 LAB — ABO/RH: ABO/RH(D): B POS

## 2013-08-28 LAB — LACTIC ACID, PLASMA: LACTIC ACID, VENOUS: 0.8 mmol/L (ref 0.5–2.2)

## 2013-08-28 LAB — RETICULOCYTES
RBC.: 4.84 MIL/uL (ref 4.22–5.81)
RETIC COUNT ABSOLUTE: 29 10*3/uL (ref 19.0–186.0)
RETIC CT PCT: 0.6 % (ref 0.4–3.1)

## 2013-08-28 LAB — VITAMIN B12: VITAMIN B 12: 823 pg/mL (ref 211–911)

## 2013-08-28 MED ORDER — OXYCODONE HCL 5 MG PO TABS: 5.0000 mg | ORAL_TABLET | ORAL | Status: DC | PRN

## 2013-08-28 MED ORDER — METHYLPREDNISOLONE SODIUM SUCC 40 MG IJ SOLR
40.0000 mg | INTRAMUSCULAR | Status: DC
  Administered 2013-08-28 – 2013-09-02 (×6): 40 mg via INTRAVENOUS
  Filled 2013-08-28 (×7): qty 1

## 2013-08-28 MED ORDER — OXYCODONE HCL 5 MG PO TABS
5.0000 mg | ORAL_TABLET | ORAL | Status: DC | PRN
  Administered 2013-08-28 – 2013-09-03 (×21): 5 mg via ORAL
  Filled 2013-08-28 (×22): qty 1

## 2013-08-28 MED ORDER — HYDRALAZINE HCL 20 MG/ML IJ SOLN
10.0000 mg | INTRAMUSCULAR | Status: DC | PRN
  Filled 2013-08-28: qty 0.5

## 2013-08-28 MED ORDER — HYDROMORPHONE HCL PF 1 MG/ML IJ SOLN
1.0000 mg | INTRAMUSCULAR | Status: DC | PRN
  Administered 2013-08-28 – 2013-09-03 (×46): 1 mg via INTRAVENOUS
  Filled 2013-08-28 (×46): qty 1

## 2013-08-28 MED ORDER — IOHEXOL 300 MG/ML  SOLN
100.0000 mL | Freq: Once | INTRAMUSCULAR | Status: AC | PRN
  Administered 2013-08-28: 100 mL via INTRAVENOUS

## 2013-08-28 MED ORDER — MESALAMINE 1.2 G PO TBEC
4.8000 g | DELAYED_RELEASE_TABLET | Freq: Every day | ORAL | Status: DC
  Administered 2013-08-28 – 2013-09-03 (×6): 4.8 g via ORAL
  Filled 2013-08-28 (×8): qty 4

## 2013-08-28 MED ORDER — IOHEXOL 300 MG/ML  SOLN
25.0000 mL | INTRAMUSCULAR | Status: AC
  Administered 2013-08-28 (×2): 25 mL via ORAL

## 2013-08-28 NOTE — Consult Note (Signed)
Patient seen, examined, and I agree with the above documentation, including the assessment and plan. Crohn's colitis with history of nonadherence and recently on no therapy.  Previous exposure to Remicade and Humira.  Cimzia worked well with MTX in the past IV steroids for now, agree with 5-ASA though expect less with 5-ASA in Crohn's compared to colitis Restart MTX 25 mg [place] weekly and begin paperwork for restarting Cimzia Will need to prove he can follow-up in the office and make commitment to his medical care

## 2013-08-28 NOTE — Progress Notes (Signed)
[facility] PROGRESS NOTE  Troy Ramirez [id number] DOB: May 13, 1985 DOA: 08/27/2013 PCP: No PCP Per Patient  Assessment/Plan: Abdominal pain/N/V/D -Likely represents a crohn's Disease flare. -Patient has not followed regularly with GI or taken medication consistently. -Will request consultation with [facility]. -Received 125 mg of solumedrol in ED; continue steroids. -Further medications as per GI recommendations.  Code Status: Full Code Family Communication: Patient only  Disposition Plan: Home when ready   Consultants:  GI   Antibiotics:  None   Subjective: No issues  Objective: Filed Vitals:   08/27/13 1839 08/27/13 2158 08/27/13 2339 08/28/13 0642  BP: 177/76 160/79 166/98 137/66  Pulse: 77 92 67 61  Temp: 97.6 F (36.4 C) 99.4 F (37.4 C) 97.9 F (36.6 C) 98 F (36.7 C)  TempSrc: Oral Oral Oral Oral  Resp: 18 20 20 20   Height: 6\' 2"  (1.88 m)  6\' 2"  (1.88 m)   Weight: 77.111 kg (170 lb)  75.433 kg (166 lb 4.8 oz)   SpO2: 98% 100% 100% 99%    Intake/Output Summary (Last 24 hours) at 08/28/13 1343 Last data filed at 08/28/13 1100  Gross per 24 hour  Intake 727.08 ml  Output      1 ml  Net 726.08 ml   Filed Weights   08/27/13 1839 08/27/13 2339  Weight: 77.111 kg (170 lb) 75.433 kg (166 lb 4.8 oz)    Exam:   General:  AA Ox3  Cardiovascular: RRR  Respiratory: CTA B  Abdomen: S/ND/+BS/diffuse, mild ternderness  Extremities: no C/C/E   Neurologic:  Grossly intact and non-focal  Data Reviewed: Basic Metabolic Panel:  Recent Labs Lab 08/27/13 1535 08/28/13 0055  NA 138 138  K 4.0 3.7  CL 97 101  CO2 27 23  GLUCOSE 129* 131*  BUN 6 7  CREATININE 0.72 0.62  CALCIUM 9.4 9.2   Liver Function Tests:  Recent Labs Lab 08/27/13 1535 08/28/13 0055  AST 14 17  ALT 8 10  ALKPHOS 38* 38*  BILITOT <0.2* <0.2*  PROT 8.1 8.0  ALBUMIN 3.3* 3.3*    Recent Labs Lab 08/27/13 1535  LIPASE 39   No results found for this  basename: AMMONIA,  in the last 168 hours CBC:  Recent Labs Lab 08/27/13 1535 08/28/13 0055  WBC 10.9* 12.3*  12.3*  NEUTROABS 9.5*  --   HGB 9.7* 9.4*  9.4*  HCT 32.4* 30.7*  30.5*  MCV 63.5* 63.4*  62.5*  PLT 622* 577*  563*   Cardiac Enzymes: No results found for this basename: CKTOTAL, CKMB, CKMBINDEX, TROPONINI,  in the last 168 hours BNP (last 3 results) No results found for this basename: PROBNP,  in the last 8760 hours CBG:  Recent Labs Lab 08/28/13 0034 08/28/13 0640 08/28/13 1214  GLUCAP 136* 124* 95    Recent Results (from the past 240 hour(s))  CLOSTRIDIUM DIFFICILE BY PCR     Status: None   Collection Time    08/28/13  3:02 AM      Result Value Ref Range Status   C difficile by pcr NEGATIVE  NEGATIVE Final   Comment: Performed at [facility]     Studies: Ct Abdomen Pelvis W Contrast  08/28/2013   CLINICAL DATA:  Pain.  EXAM: CT ABDOMEN AND PELVIS WITH CONTRAST  TECHNIQUE: Multidetector CT imaging of the abdomen and pelvis was performed using the standard protocol following bolus administration of intravenous contrast.  CONTRAST:  OMNIPAQUE IOHEXOL 300 MG/ML  SOLN  COMPARISON:  CT 08/29/2012.  FINDINGS: Liver normal. Spleen normal. Pancreas normal. No biliary distention. Gallbladder is nondistended.  Adrenals normal. Punctate 2 mm lucency in the right kidney, too small for further evaluation, most likely tiny cyst. No hydronephrosis. No evidence of obstructing ureteral stone. Bladder is nondistended. Phleboliths.  No significant adenopathy. Aorta normal caliber. Visceral vessels are patent. Portal vein is patent.  What appears to be the appendix is normal. Diffuse colonic wall thickening is again noted. Colonic wall thickening is particularly severe about the cecum. This is consistent with the patient's known Crohn's colitis. This may be chronic as noted on prior CT of 08/29/2012. There is no evidence of bowel obstruction. No free air. No  mesenteric mass lesion. No evidence of significant hernia.  Heart size normal. Lung bases are clear. No acute bony abnormality.  IMPRESSION: 1. Diffuse colitis again noted. Changes are particularly prominent about the cecum. Similar findings noted on the prior CT of 08/29/2012. These findings would be consistent with patient's known Crohn's colitis. 2. No other significant abnormality identified .   Electronically Signed   By: Maisie Fushomas  Register   On: 08/28/2013 10:35    Scheduled Meds: . mesalamine  4.8 g Oral Q breakfast  . methylPREDNISolone (SOLU-MEDROL) injection  40 mg Intravenous Q24H   Continuous Infusions: . sodium chloride 125 mL/hr at 08/28/13 0011    Principal Problem:   Abdominal pain Active Problems:   Crohn's colitis   Joint pain   Anemia    Time spent: 35 minutes. Greater than 50% of this time was spent in direct contact with the patient coordinating care.    Chaya JanHERNANDEZ ACOSTA,ESTELA  [facility] Pager [phone]  If 7PM-7AM, please contact night-coverage at [email or web address], password [email or web address] 08/28/2013, 1:43 PM  LOS: 1 day

## 2013-08-28 NOTE — Progress Notes (Signed)
Pt and family upset regarding pain medication frequency and pt remaining in pain.  Patient states he does not recall Ardyth Harps MD rounding on him today.  I reassured pt that physician rounded and reviewed notes with patient and family.  Pt remains upset and states a physician informed him that pain medication could be changed to every 2 hrs as needed if pain increased with diet.  MD notes and Amy PA with GI notes do not reflect patient statement.  Dr. Ardyth Harps notified, states she rounded on patient with Revonda Standard from Patient Experience Department today around 0900.  New orders given to change IV pain med to Q2hr prn for severe pain, Oxy IR po for moderate pain PRN, and change diet to NPO until severe abdominal pain subsides.  New orders transcribed and patient/family notified. IV pain medication administered and no further complaints noted.  Patient and family laughing and discussing dogs with staff.

## 2013-08-28 NOTE — Consult Note (Signed)
Referring Troy Ramirez: No ref. Troy Ramirez found Primary Care Physician:  No PCP Per Patient Primary Gastroenterologist:  Dr. Marina Goodell  Reason for Consultation: Crohns exacerbation  HPI: Troy Ramirez is a 28 y.o. male  Known to Dr. Marina Goodell  , not seen in our office since 7 /2014. Pt with Crohns colitis initially dx in 2006- has had T.I . Involvement. Pt has seen several gastroenterologists , including dr. Gwenith Spitz at [facility]. Disease has been associated with arthropathy. Pt also has  had chronic pain issues and had been on methadone at one point- off since fall 2014 .  Intolerant to Imuran with increased joint pains Infusion rx -  Infliximab Loss of response to Humira ? Cimzia worked well and tolerated but insurance  Denied after induction phase Last Flex 9/13- severe inflammation above 25 cm Methotrexate initiated and tolerated 11/13- pt stopped because of nausea but willing to try again  Admitted now with one month hx of increasing abdominal pain, cramping progressing to N/V and diarrhea. No meds since December 2014. Pt had an URI  2 months ago and took antibiotics- says has been sick ever since with colitis flair. Found anemic on admit with hgb9.7, microcytic Edmon Crape for cdiff negative   CT abd/pelvis- diffuse colitis, similar to 2014 CT , especially prominent at cecum.  Pt says he knows he needs to treat his Crohns differently- has tried to manage with Gluten free diet, and marijuana- worked Ok until 2 months ago. Says he gets frustrated  Because of all the hassles with insurance with meds etc. Knows he" went a little crazy" on prednisone in the past, also feels Cymbalta altered his  Mentation.   Past Medical History  Diagnosis Date  . Crohn disease   . Anemia     required transfusion of bood in 2008 or 2009  . 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   . Internal hemorrhoids     Past Surgical History  Procedure Laterality Date  . Neg hx      Prior  to Admission medications   Medication Sig Start Date End Date Taking? Authorizing Terrie Grajales  acetaminophen (TYLENOL) 500 MG tablet Take 500-1,000 mg by mouth every 6 (six) hours as needed for pain.    Yes Historical Shaida Route, MD  vitamin C (ASCORBIC ACID) 500 MG tablet Take 1,000 mg by mouth daily.   Yes Historical Catha Ontko, MD    Current Facility-Administered Medications  Medication Dose Route Frequency Kellin Fifer Last Rate Last Dose  . 0.9 %  sodium chloride infusion   Intravenous Continuous Eduard Clos, MD 125 mL/hr at 08/28/13 0011    . acetaminophen (TYLENOL) tablet 650 mg  650 mg Oral Q6H PRN Eduard Clos, MD       Or  . acetaminophen (TYLENOL) suppository 650 mg  650 mg Rectal Q6H PRN Eduard Clos, MD      . hydrALAZINE (APRESOLINE) injection 10 mg  10 mg Intravenous Q4H PRN Eduard Clos, MD      . HYDROmorphone (DILAUDID) injection 1 mg  1 mg Intravenous Q3H PRN Eduard Clos, MD   1 mg at 08/28/13 0920  . ondansetron (ZOFRAN) tablet 4 mg  4 mg Oral Q6H PRN Eduard Clos, MD       Or  . ondansetron [facility]) injection 4 mg  4 mg Intravenous Q6H PRN Eduard Clos, MD   4 mg at 08/28/13 0757    Allergies as of  08/27/2013 - Review Complete 08/27/2013  Allergen Reaction Noted  . Humira [adalimumab] Anaphylaxis 08/27/2012  . Remicade [infliximab] Anaphylaxis and Swelling 08/29/2012    Family History  Problem Relation Age of Onset  . Diabetes      Both sides  . Heart disease      both sides  . Breast cancer Maternal Grandmother   . Prostate cancer Paternal Grandfather   . Colon cancer Neg Hx     History   Social History  . Marital Status: Single    Spouse Name: N/A    Number of Children: 0  . Years of Education: N/A   Occupational History  . manager    Social History Main Topics  . Smoking status: Never Smoker   . Smokeless tobacco: Never Used  . Alcohol Use: Yes     Comment: rarey  . Drug Use: Yes    Special: Marijuana   . Sexual Activity: Not on file   Other Topics Concern  . Not on file   Social History Narrative  . No narrative on file    Review of Systems: Pertinent positive and negative review of systems were noted in the above HPI section.  All other review of systems was otherwise negative. Physical Exam: Vital signs in last 24 hours: Temp:  [97.6 F (36.4 C)-99.4 F (37.4 C)] 98 F (36.7 C) (06/23 4540) Pulse Rate:  [61-104] 61 (06/23 0642) Resp:  [18-20] 20 (06/23 0642) BP: (127-177)/(66-98) 137/66 mmHg (06/23 0642) SpO2:  [98 %-100 %] 99 % (06/23 0642) Weight:  [166 lb 4.8 oz (75.433 kg)-170 lb (77.111 kg)] 166 lb 4.8 oz (75.433 kg) (06/22 2339) Last BM Date: 08/28/13 General:   Alert,  Well-developed, thin young WM, pleasant and cooperative in NAD Head:  Normocephalic and atraumatic. Eyes:  Sclera clear, no icterus.   Conjunctiva pink. Ears:  Normal auditory acuity. Nose:  No deformity, discharge,  or lesions. Mouth:  No deformity or lesions.   Neck:  Supple; no masses or thyromegaly. Lungs:  Clear throughout to auscultation.   No wheezes, crackles, or rhonchi. Heart:  Regular rate and rhythm; no murmurs, clicks, rubs,  or gallops. Abdomen:  Soft,mild diffuse tenderness, BS active,nonpalp mass or hsm.   Rectal:  Deferred  Msk:  Symmetrical without gross deformities. . Pulses:  Normal pulses noted. Extremities:  Without clubbing or edema. Neurologic:  Alert and  oriented x4;  grossly normal neurologically. Skin:  Intact without significant lesions or rashes.. Psych:  Alert and cooperative. Normal mood and affect.  Intake/Output from previous day: 06/22 0701 - 06/23 0700 In: 727.1 [I.V.:727.1] Out: -  Intake/Output this shift:    Lab Results:  Recent Labs  08/27/13 1535 08/28/13 0055  WBC 10.9* 12.3*  12.3*  HGB 9.7* 9.4*  9.4*  HCT 32.4* 30.7*  30.5*  PLT 622* 577*  563*   BMET  Recent Labs  08/27/13 1535 08/28/13 0055  NA 138 138  K 4.0 3.7  CL 97 101   CO2 27 23  GLUCOSE 129* 131*  BUN 6 7  CREATININE 0.72 0.62  CALCIUM 9.4 9.2   LFT  Recent Labs  08/28/13 0055  PROT 8.0  ALBUMIN 3.3*  AST 17  ALT 10  ALKPHOS 38*  BILITOT <0.2*   PT/INR No results found for this basename: LABPROT, INR,  in the last 72 hours Hepatitis Panel No results found for this basename: HEPBSAG, HCVAB, HEPAIGM, HEPBIGM,  in the last 72 hours   MPRESSION:  #1  28 yo male with Crohns colitis- diffuse  With exacerbation x 2 months triggered by URI and antibiotics.  Complicated hx- most recent regimen had been Methotrexate 25 mg SQ weekly, tolerated Cimzia, loss of response to Humira?, Hx of infusion Rx with Infliximab Primary issue has been his reluctance to stay on any regimen long term   Pt noncompliant with meds and had been off all meds since 02/2013 #2 Microcytic anemia- r/o iron and B12 def #3 Crohns related arthropathy   PLAN: #1 Clear liquids #2 Solumedrol 40 mg IV daily-will not tolerate higher doses #3 start Lialda 4.8 daily #4 Pathogen panel #5 Would like to let him try Ulceris rather than long course of prednisone #6 Would also initiate  Methotrexate  again  25 mg SQ weekly #7 Discussed need for ongoing outpt follow up, and commitment to standard care for his disease   [name]  08/28/2013, 10:03 AM

## 2013-08-29 ENCOUNTER — Telehealth: Payer: Self-pay

## 2013-08-29 DIAGNOSIS — R109 Unspecified abdominal pain: Secondary | ICD-10-CM

## 2013-08-29 DIAGNOSIS — G894 Chronic pain syndrome: Secondary | ICD-10-CM

## 2013-08-29 LAB — BASIC METABOLIC PANEL
BUN: 6 mg/dL (ref 6–23)
CALCIUM: 9.1 mg/dL (ref 8.4–10.5)
CO2: 26 mEq/L (ref 19–32)
Chloride: 103 mEq/L (ref 96–112)
Creatinine, Ser: 0.64 mg/dL (ref 0.50–1.35)
GFR calc Af Amer: >90 mL/min (ref >90)
GFR calc non Af Amer: >90 mL/min (ref >90)
Glucose, Bld: 87 mg/dL (ref 70–99)
Potassium: 4 mEq/L (ref 3.7–5.3)
SODIUM: 141 meq/L (ref 137–147)

## 2013-08-29 LAB — CBC
HCT: 31.4 % — ABNORMAL LOW (ref 39.0–52.0)
HEMOGLOBIN: 9.3 g/dL — AB (ref 13.0–17.0)
MCH: 19.1 pg — ABNORMAL LOW (ref 26.0–34.0)
MCHC: 29.6 g/dL — ABNORMAL LOW (ref 30.0–36.0)
MCV: 64.6 fL — AB (ref 78.0–100.0)
PLATELETS: 508 10*3/uL — AB (ref 150–400)
RBC: 4.86 MIL/uL (ref 4.22–5.81)
RDW: 17.4 % — ABNORMAL HIGH (ref 11.5–15.5)
WBC: 13.2 10*3/uL — AB (ref 4.0–10.5)

## 2013-08-29 MED ORDER — POLYSACCHARIDE IRON COMPLEX 150 MG PO CAPS
150.0000 mg | ORAL_CAPSULE | Freq: Two times a day (BID) | ORAL | Status: DC
  Administered 2013-08-29 – 2013-09-03 (×10): 150 mg via ORAL
  Filled 2013-08-29 (×12): qty 1

## 2013-08-29 MED ORDER — METHOTREXATE SODIUM CHEMO INJECTION 25 MG/ML
25.0000 mg | INTRAMUSCULAR | Status: DC
  Administered 2013-08-29: 25 mg via SUBCUTANEOUS
  Filled 2013-08-29: qty 1

## 2013-08-29 NOTE — Progress Notes (Signed)
Patient seen, examined, and I agree with the above documentation, including the assessment and plan.  

## 2013-08-29 NOTE — Progress Notes (Signed)
Patient ID: Troy Ramirez, male   DOB: [birth year], 28 y.o.   MRN: [id number] [facility] PROGRESS NOTE  Troy Ramirez YQM:[id number] DOB: [birth year] DOA: 08/27/2013 PCP: No PCP Per Patient  Brief narrative: 28 year old male with [past medical history of narcotic use / abuse, Crohn's disease who [facility] with abdominal pain and was found to have colitis as evidenced on CT abdomen. GI is assisting the management.   Assessment/Plan:  Principal Problem:   Abdominal pain / Crohn's colitis - secondary to crohn's colitis. CT abdomen with evidence of diffuse colitis more prominent in cecum. - on Lialda, solumedrol per GI recommendations - per GI methotrexate started today as well, weekly injections  Active Problems:   Anemia of Crohn's disease - hemoglobin stable at 9.3 - no current indications for transfusion   DVT prophylaxis: SCD's bilaterally.  Code Status: full code  Family Communication: plan of care discussed with the patient Disposition Plan: home when stable   Manson PasseyEVINE, ALMA, MD  [facility] Pager [phone]  If 7PM-7AM, please contact night-coverage [email or web address] Password [email or web address] 08/29/2013, 1:49 PM   LOS: 2 days   Consultants:  Gastroenterology   Procedures:  None   Antibiotics:  None   HPI/Subjective: No acute overnight events.  Objective: Filed Vitals:   08/28/13 0642 08/28/13 1446 08/28/13 2111 08/29/13 0554  BP: 137/66 149/83 132/84 144/89  Pulse: 61 68 62 64  Temp: 98 F (36.7 C) 97.9 F (36.6 C) 98.3 F (36.8 C) 98 F (36.7 C)  TempSrc: Oral Oral Oral Oral  Resp: 20 18 20 20   Height:      Weight:      SpO2: 99% 99% 100% 100%    Intake/Output Summary (Last 24 hours) at 08/29/13 1349 Last data filed at 08/29/13 0211  Gross per 24 hour  Intake   2125 ml  Output    452 ml  Net   1673 ml    Exam:   General:  Pt is alert, follows commands appropriately, not in acute distress  Cardiovascular: Regular rate and  rhythm, S1/S2, no murmurs  Respiratory: Clear to auscultation bilaterally, no wheezing, no crackles  Abdomen: Soft, non tender, non distended, bowel sounds present  Extremities: No edema, pulses DP and PT palpable bilaterally  Neuro: Grossly nonfocal  Data Reviewed: Basic Metabolic Panel:  Recent Labs Lab 08/27/13 1535 08/28/13 0055 08/29/13 0520  NA 138 138 141  K 4.0 3.7 4.0  CL 97 101 103  CO2 27 23 26   GLUCOSE 129* 131* 87  BUN 6 7 6   CREATININE 0.72 0.62 0.64  CALCIUM 9.4 9.2 9.1   Liver Function Tests:  Recent Labs Lab 08/27/13 1535 08/28/13 0055  AST 14 17  ALT 8 10  ALKPHOS 38* 38*  BILITOT <0.2* <0.2*  PROT 8.1 8.0  ALBUMIN 3.3* 3.3*    Recent Labs Lab 08/27/13 1535  LIPASE 39   No results found for this basename: AMMONIA,  in the last 168 hours CBC:  Recent Labs Lab 08/27/13 1535 08/28/13 0055 08/29/13 0520  WBC 10.9* 12.3*  12.3* 13.2*  NEUTROABS 9.5*  --   --   HGB 9.7* 9.4*  9.4* 9.3*  HCT 32.4* 30.7*  30.5* 31.4*  MCV 63.5* 63.4*  62.5* 64.6*  PLT 622* 577*  563* 508*   Cardiac Enzymes: No results found for this basename: CKTOTAL, CKMB, CKMBINDEX, TROPONINI,  in the last 168 hours BNP: No components found with this basename: POCBNP,  CBG:  Recent Labs  Lab 08/28/13 0034 08/28/13 0640 08/28/13 1214  GLUCAP 136* 124* 95    Recent Results (from the past 240 hour(s))  CULTURE, BLOOD (ROUTINE X 2)     Status: None   Collection Time    08/28/13 12:50 AM      Result Value Ref Range Status   Specimen Description BLOOD LEFT HAND   Final   Special Requests BOTTLES DRAWN AEROBIC ONLY 10CC   Final   Culture  Setup Time     Final   Value: 08/28/2013 04:21     Performed at [facility]   Culture     Final   Value:        BLOOD CULTURE RECEIVED NO GROWTH TO DATE CULTURE WILL BE HELD FOR 5 DAYS BEFORE ISSUING A FINAL NEGATIVE REPORT     Performed at [facility]   Report Status PENDING   Incomplete  CULTURE,  BLOOD (ROUTINE X 2)     Status: None   Collection Time    08/28/13 12:50 AM      Result Value Ref Range Status   Specimen Description BLOOD LEFT ARM   Final   Special Requests BOTTLES DRAWN AEROBIC ONLY 10CC   Final   Culture  Setup Time     Final   Value: 08/28/2013 04:21     Performed at [facility]   Culture     Final   Value:        BLOOD CULTURE RECEIVED NO GROWTH TO DATE CULTURE WILL BE HELD FOR 5 DAYS BEFORE ISSUING A FINAL NEGATIVE REPORT     Performed at [facility]   Report Status PENDING   Incomplete  CLOSTRIDIUM DIFFICILE BY PCR     Status: None   Collection Time    08/28/13  3:02 AM      Result Value Ref Range Status   C difficile by pcr NEGATIVE  NEGATIVE Final   Comment: Performed at [facility]  STOOL CULTURE     Status: None   Collection Time    08/28/13  3:02 AM      Result Value Ref Range Status   Specimen Description STOOL   Final   Special Requests NONE   Final   Culture     Final   Value: Culture reincubated for better growth     Performed at [facility]   Report Status PENDING   Incomplete     Studies: Ct Abdomen Pelvis W Contrast 08/28/2013  IMPRESSION: 1. Diffuse colitis again noted. Changes are particularly prominent about the cecum. Similar findings noted on the prior CT of 08/29/2012. These findings would be consistent with patient's known Crohn's colitis. 2. No other significant abnormality identified .     Scheduled Meds: . iron polysaccharides  150 mg Oral BID  . mesalamine  4.8 g Oral Q breakfast  . methotrexate  25 mg Subcutaneous Weekly  . methylPREDNISolone (SOLU-MEDROL) injection  40 mg Intravenous Q24H

## 2013-08-29 NOTE — Telephone Encounter (Signed)
Records faxed and waiting on appt.

## 2013-08-29 NOTE — Telephone Encounter (Signed)
Message copied by Chrystie NoseHUNT, LINDA R on Wed Aug 29, 2013 10:25 AM ------      Message from: Mike GipESTERWOOD, AMY S      Created: Wed Aug 29, 2013 10:05 AM       Please get this pt referred to a pain clinic in [place]- had previously been pt in [place]-wont go back there- has [employer] insurance..the patient of  Dr. Marina GoodellPerry ------

## 2013-08-29 NOTE — Progress Notes (Signed)
Pt refused CBG.

## 2013-08-29 NOTE — Telephone Encounter (Signed)
Left message for Troy Ramirez with [facility] to call back regarding new pt referral.  Spoke with Troy Ramirez and she is faxing referral over.

## 2013-08-29 NOTE — Progress Notes (Signed)
Pt refused CBG's this shift.

## 2013-08-29 NOTE — Care Management Note (Signed)
    Page 1 of 1   08/29/2013     1:56:48 PM CARE MANAGEMENT NOTE 08/29/2013  Patient:  BURHAN, SMYRE   Account Number:  [id number]  Date Initiated:  08/28/2013  Documentation initiated by:  [facility]  Subjective/Objective Assessment:   28 year old male admitted with Crohn's exacerbation.     Action/Plan:   From home.   Anticipated DC Date:  08/31/2013   Anticipated DC Plan:  HOME/SELF CARE      DC Planning Services  CM consult      Choice offered to / List presented to:             Status of service:  Completed, signed off Medicare Important Message given?   (If response is "NO", the following Medicare IM given date fields will be blank) Date Medicare IM given:   Date Additional Medicare IM given:    Discharge Disposition:    Per UR Regulation:  Reviewed for med. necessity/level of care/duration of stay  If discussed at Long Length of Stay Meetings, dates discussed:    Comments:  08/28/13 Algernon Huxley RN BSN [phone] Mer with pt at bedside to discuss follow up care post hospital stay. He is listed a self pay but he informed me he has [employer] as his health insurance provider and does have prescription coverage. He does not have a PCP and informed me hs is not interested in establishing care with one. Pt seemed disengaged during this conversation and kept his eyes either closed or a blanket over his head.

## 2013-08-29 NOTE — Progress Notes (Signed)
Patient ID: Troy Ramirez, male   DOB: 1985/07/05, 28 y.o.   MRN: [id number] [facility] Progress Note  Subjective: Issues last night with pain meds and requests for higher doses- no on q 2hours Dilaudid, and NPO. Still c/o abdominal pain, 2-3 BM's /24 hours, nausea  Gi pathogen panel- neg FE <10  Objective:  Vital signs in last 24 hours: Temp:  [97.9 F (36.6 C)-98.3 F (36.8 C)] 98 F (36.7 C) (06/24 0554) Pulse Rate:  [62-68] 64 (06/24 0554) Resp:  [18-20] 20 (06/24 0554) BP: (132-149)/(83-89) 144/89 mmHg (06/24 0554) SpO2:  [99 %-100 %] 100 % (06/24 0554) Last BM Date: 08/28/13 General:   Alert,  Well-developed, thin WM   in NAD Heart:  Regular rate and rhythm; no murmurs Pulm;clear Abdomen:  Soft, tender and nondistended. Normal bowel sounds, without guarding, and without rebound.   Extremities:  Without edema. Neurologic:  Alert and  oriented x4;  grossly normal neurologically. Psych:  Alert and cooperative. Normal mood and affect.  Intake/Output from previous day: 06/23 0701 - 06/24 0700 In: 2365 [P.O.:240; I.V.:2125] Out: 453 [Urine:450; Stool:3] Intake/Output this shift:    Lab Results:  Recent Labs  08/27/13 1535 08/28/13 0055 08/29/13 0520  WBC 10.9* 12.3*  12.3* 13.2*  HGB 9.7* 9.4*  9.4* 9.3*  HCT 32.4* 30.7*  30.5* 31.4*  PLT 622* 577*  563* 508*   BMET  Recent Labs  08/27/13 1535 08/28/13 0055 08/29/13 0520  NA 138 138 141  K 4.0 3.7 4.0  CL 97 101 103  CO2 27 23 26   GLUCOSE 129* 131* 87  BUN 6 7 6   CREATININE 0.72 0.62 0.64  CALCIUM 9.4 9.2 9.1   LFT  Recent Labs  08/28/13 0055  PROT 8.0  ALBUMIN 3.3*  AST 17  ALT 10  ALKPHOS 38*  BILITOT <0.2*      Assessment / Plan: #1)  28 yo male with exacerbation of Crohns colitis/universal colitis-  Continue Solumedrol, lialda Restart methotrexate 25 mg sq weekly Clear liquids Compliance is his major issue #2 Chronic pain syndrome, IBD related arthropathy-  Pt has  addiction issues, high tolerance- did well apparently on Methadone but refusing to go back to previous pain clinic- I explained he will not get prescription For Dilaudid or methadone without being established with a pain clinic- he is willing to establish here  in [place]. Will work on referral #3 ) iron deficiency anemia- start niferex Principal Problem:   Abdominal pain Active Problems:   Crohn's colitis   Joint pain   Anemia     LOS: 2 days   [name]  08/29/2013, 9:42 AM

## 2013-08-30 DIAGNOSIS — D649 Anemia, unspecified: Secondary | ICD-10-CM

## 2013-08-30 LAB — GLUCOSE, CAPILLARY: GLUCOSE-CAPILLARY: 100 mg/dL — AB (ref 70–99)

## 2013-08-30 NOTE — Telephone Encounter (Signed)
Ok, great.

## 2013-08-30 NOTE — Telephone Encounter (Signed)
Spoke with [name] at [facility] at [address] [place]. Pt has been accepted as a patient there. When pt is discharged or right before discharged he needs to call [name] at [phone] for the appointment.

## 2013-08-30 NOTE — Progress Notes (Signed)
Clinical Social Work Department BRIEF PSYCHOSOCIAL ASSESSMENT 08/30/2013  Patient:  Troy Ramirez, Troy Ramirez     Account Number:  [id number]     Admit date:  08/27/2013  Clinical Social Worker:  Troy Ramirez  Date/Time:  08/30/2013 03:30 PM  Referred by:  RN  Date Referred:  08/30/2013 Referred for  Psychosocial assessment   Other Referral:   Interview type:  Patient Other interview type:    PSYCHOSOCIAL DATA Living Status:  FRIEND(S) Admitted from facility:   Level of care:   Primary support name:  Troy Ramirez Primary support relationship to patient:  PARENT Degree of support available:   Adequate    CURRENT CONCERNS Current Concerns  Adjustment to Illness  Substance Abuse   Other Concerns:   Mom concerned about patient's substance use and worried that patient is not coping well with lose of sister.    SOCIAL WORK ASSESSMENT / PLAN CSW received referral in order to complete psychosocial assessment. CSW reviewed chart and met with patient at bedside. CSW introduced myself and explained role.    Patient agreeable to assessment and spoke about how he was doing prior to admission. 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 Patient reports that job is very stressful and that he it is contributing to Chron's flare ups. Patient reports that he was diagnosed with Chrons about 11 years ago but that managing his pain has been a challenge. Patient states that insurance has denied several medications and that it was too expensive to go to the Pain Clinic. Patient has been using prescription drugs (that are not prescribed to him) and marijuana to manage pain.    CSW and patient discussed SA and patient agreeable to complete SBIRT. Patient reports that he used to drink a lot of alcohol. Patient was charged with a DUI on his 25th birthday and reports that he was not drunk but only had one drink. Patient reports that he knew alcohol was making him feel more depressed  and was not helpful with his Chrons so he decided to quit drinking. Patient changed story throughout assessment on whether or not he was still drinking but denied several times that he was an alcoholic or had any problem with substance use. Patient admitted to daily marijuana use to assist with managing symptoms of Chrons. Patient reports that he is not using substances to cope with feelings and does not want treatment. Patient reports that he is going to schedule an appointment at the Pain Clinic and is aware that he cannot test positive for any substances so he plans on not smoking marijuana anymore.    CSW and patient discussed MH concerns. Patient reports that he feels depressed and is able to identify triggers such as stressful job, chronic health problems, and that sister committed suicide. Patient reports that he tried taking Cymbalta but it was not effective and he is not interested in any further medication management. CSW and patient spoke in depth about sister's suicide and patient's guilt surrounding incident. Patient reports that mom attended support groups after suicide but he was not interested in going. Patient acknowledges that he never "dealt with feelings" but is agreeable to consider treatment now. CSW agreeable to gather some resources for patient at DC.    Patient denies that substance use is related to Troy Ramirez concerns. Patient reports that he did use alcohol to cope with depression but realizes that is not healthy. CSW and patient discussed positive coping skills such as relying on support  systems, exercising, maintaining routine schedule, and therapy for grief. Patient agreeable to outpatient follow up and continued CSW support during hospital stay.   Assessment/plan status:  Referral to [employer] Other assessment/ plan:   SBIRT   Information/referral to community resources:   Community Referrals for therapy  Hospice and Palliative Care for therapy    PATIENT'S/FAMILY'S  RESPONSE TO PLAN OF CARE: Patient engaged in assessment and thanked CSW for assistance. Patient agreeable to talk about SA and MH but continues to report that he is not an alcoholic but does have "a problem with alcohol". Patient is not interested in SA treatment or medication management but is aware that therapy could be beneficial for dealing with sister's suicide. Patient tearful when talking about sister and reports they had a very close relationship. Patient has supportive friends and family but reports strained relationship with father that he wants to work on as well. Patient thanked CSW for time and agreeable to continued sessions.       Troy Ramirez, [place] [phone]

## 2013-08-30 NOTE — Progress Notes (Signed)
Patient ID: Troy Ramirez, male   DOB: [birth year], 28 y.o.   MRN: [id number]   Subjective: Feeling a little better, still not wanting to eat much as increases pain. No vomiting, Received Methotrexate yesterday Objective:  Vital signs in last 24 hours: Temp:  [98 F (36.7 C)-98.3 F (36.8 C)] 98.3 F (36.8 C) (06/25 0550) Pulse Rate:  [56-68] 63 (06/25 0550) Resp:  [18-20] 20 (06/25 0550) BP: (138-150)/(80-93) 149/82 mmHg (06/25 0550) SpO2:  [100 %] 100 % (06/25 0550) Last BM Date: 08/29/13 General:   Alert,  Well-developed, WM    in NAD Heart:  Regular rate and rhythm; no murmurs Pulm;clear Abdomen:  Soft, mildly diffusely tender and nondistended. Normal bowel sounds, without guarding, and without rebound.   Extremities:  Without edema. Neurologic:  Alert and  oriented x4;  grossly normal neurologically. Psych:  Alert and cooperative. Normal mood and affect.  Intake/Output from previous day:   Intake/Output this shift:    Lab Results:  Recent Labs  08/27/13 1535 08/28/13 0055 08/29/13 0520  WBC 10.9* 12.3*  12.3* 13.2*  HGB 9.7* 9.4*  9.4* 9.3*  HCT 32.4* 30.7*  30.5* 31.4*  PLT 622* 577*  563* 508*   BMET  Recent Labs  08/27/13 1535 08/28/13 0055 08/29/13 0520  NA 138 138 141  K 4.0 3.7 4.0  CL 97 101 103  CO2 27 23 26   GLUCOSE 129* 131* 87  BUN 6 7 6   CREATININE 0.72 0.62 0.64  CALCIUM 9.4 9.2 9.1   LFT  Recent Labs  08/28/13 0055  PROT 8.0  ALBUMIN 3.3*  AST 17  ALT 10  ALKPHOS 38*  BILITOT <0.2*    Assessment / Plan: #1 )28 yo male with acute exacerbation of Crohns colitis #2) IBD related Arthropathy #3) Hx of narcotic dependence- referral to pain clinic initiated yesterday through our office  Slowly advance diet, as he tolerates Continue IV steroids until discharge Lialda 4,8 gm daily Methotrexate 2.5 mg sq weekly Principal Problem:   Abdominal pain Active Problems:   Crohn's colitis   Joint pain   Anemia     LOS: 3 days    [name]  08/30/2013, 9:33 AM

## 2013-08-30 NOTE — Progress Notes (Signed)
Patient seen, examined, and I agree with the above documentation, including the assessment and plan. Slow improvement, continue to advance diet as tolerated Continuing IV steroids, 5-ASA therapy and reinitiation of methotrexate Patient will need to prove compliance and that he can followup in the office before reinitiation Biologics, though he would likely benefit from reinitiation of biologic therapy for induction and maintenance of remission Ultimately narcotics need to be weaned off

## 2013-08-30 NOTE — Progress Notes (Addendum)
Patient ID: Troy Ramirez [place], male   DOB: 1985/04/01, 28 y.o.   MRN: [id number] [facility] PROGRESS NOTE  Troy Ramirez [place] VFI:[id number] DOB: 1985/04/01 DOA: 08/27/2013 PCP: No PCP Per Patient  Brief narrative: 28 year old male with [past medical history of narcotic use / abuse, Crohn's disease who [facility] with abdominal pain and was found to have colitis as evidenced on CT abdomen. GI is assisting the management.   Assessment/Plan:   Principal Problem:  Abdominal pain / Crohn's colitis  - secondary to crohn's colitis. CT abdomen with evidence of diffuse colitis more prominent in cecum.  - on Lialda, MTX and solumedrol per GI recommendations  - diset clear liquid and advance to as tolerated  Active Problems:  Anemia of chronic disease  - hemoglobin stable at 9.3  - no current indications for transfusion    DVT prophylaxis: SCD's bilaterally.   Code Status: full code  Family Communication: plan of care discussed with the patient  Disposition Plan: home when stable   Consultants:  Gastroenterology  Procedures:  None  Antibiotics:  None    Manson PasseyEVINE, ALMA, MD  [facility] Pager [phone]  If 7PM-7AM, please contact night-coverage [email or web address] Password [email or web address] 08/30/2013, 2:45 PM   LOS: 3 days    HPI/Subjective: No acute overnight events.  Objective: Filed Vitals:   08/29/13 1401 08/29/13 2139 08/30/13 0550 08/30/13 1339  BP: 150/93 138/80 149/82 154/80  Pulse: 56 68 63 72  Temp: 98 F (36.7 C) 98.2 F (36.8 C) 98.3 F (36.8 C) 97.8 F (36.6 C)  TempSrc: Oral Oral Oral Oral  Resp: 18 20 20 20   Height:      Weight:      SpO2: 100% 100% 100% 100%    Intake/Output Summary (Last 24 hours) at 08/30/13 1445 Last data filed at 08/30/13 1341  Gross per 24 hour  Intake    240 ml  Output      0 ml  Net    240 ml    Exam:   General:  Pt is alert, follows commands appropriately, not in acute distress  Cardiovascular: Regular rate and  rhythm, S1/S2, no murmurs  Respiratory: Clear to auscultation bilaterally, no wheezing, no crackles, no rhonchi  Abdomen: Soft, non tender, non distended, bowel sounds present  Extremities: No edema, pulses DP and PT palpable bilaterally  Neuro: Grossly nonfocal  Data Reviewed: Basic Metabolic Panel:  Recent Labs Lab 08/27/13 1535 08/28/13 0055 08/29/13 0520  NA 138 138 141  K 4.0 3.7 4.0  CL 97 101 103  CO2 27 23 26   GLUCOSE 129* 131* 87  BUN 6 7 6   CREATININE 0.72 0.62 0.64  CALCIUM 9.4 9.2 9.1   Liver Function Tests:  Recent Labs Lab 08/27/13 1535 08/28/13 0055  AST 14 17  ALT 8 10  ALKPHOS 38* 38*  BILITOT <0.2* <0.2*  PROT 8.1 8.0  ALBUMIN 3.3* 3.3*    Recent Labs Lab 08/27/13 1535  LIPASE 39   No results found for this basename: AMMONIA,  in the last 168 hours CBC:  Recent Labs Lab 08/27/13 1535 08/28/13 0055 08/29/13 0520  WBC 10.9* 12.3*  12.3* 13.2*  NEUTROABS 9.5*  --   --   HGB 9.7* 9.4*  9.4* 9.3*  HCT 32.4* 30.7*  30.5* 31.4*  MCV 63.5* 63.4*  62.5* 64.6*  PLT 622* 577*  563* 508*   Cardiac Enzymes: No results found for this basename: CKTOTAL, CKMB, CKMBINDEX, TROPONINI,  in the last 168  hours BNP: No components found with this basename: POCBNP,  CBG:  Recent Labs Lab 08/28/13 0034 08/28/13 0640 08/28/13 1214 08/30/13 0554  GLUCAP 136* 124* 95 100*    Recent Results (from the past 240 hour(s))  CULTURE, BLOOD (ROUTINE X 2)     Status: None   Collection Time    08/28/13 12:50 AM      Result Value Ref Range Status   Specimen Description BLOOD LEFT HAND   Final   Special Requests BOTTLES DRAWN AEROBIC ONLY 10CC   Final   Culture  Setup Time     Final   Value: 08/28/2013 04:21     Performed at [facility]   Culture     Final   Value:        BLOOD CULTURE RECEIVED NO GROWTH TO DATE CULTURE WILL BE HELD FOR 5 DAYS BEFORE ISSUING A FINAL NEGATIVE REPORT     Performed at [facility]   Report Status  PENDING   Incomplete  CULTURE, BLOOD (ROUTINE X 2)     Status: None   Collection Time    08/28/13 12:50 AM      Result Value Ref Range Status   Specimen Description BLOOD LEFT ARM   Final   Special Requests BOTTLES DRAWN AEROBIC ONLY 10CC   Final   Culture  Setup Time     Final   Value: 08/28/2013 04:21     Performed at [facility]   Culture     Final   Value:        BLOOD CULTURE RECEIVED NO GROWTH TO DATE CULTURE WILL BE HELD FOR 5 DAYS BEFORE ISSUING A FINAL NEGATIVE REPORT     Performed at [facility]   Report Status PENDING   Incomplete  CLOSTRIDIUM DIFFICILE BY PCR     Status: None   Collection Time    08/28/13  3:02 AM      Result Value Ref Range Status   C difficile by pcr NEGATIVE  NEGATIVE Final   Comment: Performed at [facility]  STOOL CULTURE     Status: None   Collection Time    08/28/13  3:02 AM      Result Value Ref Range Status   Specimen Description STOOL   Final   Special Requests NONE   Final   Culture     Final   Value: NO SUSPICIOUS COLONIES, CONTINUING TO HOLD     Performed at [facility]   Report Status PENDING   Incomplete     Studies: No results found.  Scheduled Meds: . iron polysaccharides  150 mg Oral BID  . mesalamine  4.8 g Oral Q breakfast  . methotrexate  25 mg Subcutaneous Weekly  . methylPREDNISolone (SOLU-MEDROL) injection  40 mg Intravenous Q24H   Continuous Infusions:

## 2013-08-31 DIAGNOSIS — D72829 Elevated white blood cell count, unspecified: Secondary | ICD-10-CM

## 2013-08-31 LAB — GLUCOSE, CAPILLARY: Glucose-Capillary: 85 mg/dL (ref 70–99)

## 2013-08-31 MED ORDER — BOOST PLUS PO LIQD
237.0000 mL | Freq: Three times a day (TID) | ORAL | Status: DC
  Administered 2013-08-31 – 2013-09-03 (×6): 237 mL via ORAL
  Filled 2013-08-31 (×11): qty 237

## 2013-08-31 MED ORDER — AMLODIPINE BESYLATE 2.5 MG PO TABS
2.5000 mg | ORAL_TABLET | Freq: Every day | ORAL | Status: DC
  Administered 2013-08-31 – 2013-09-02 (×3): 2.5 mg via ORAL
  Filled 2013-08-31 (×3): qty 1

## 2013-08-31 NOTE — Progress Notes (Addendum)
Patient ID: Troy Ramirez, male   DOB: August 20, 1985, 28 y.o.   MRN: [id number] [facility] PROGRESS NOTE  Troy MoatSean Hust WUJ:[id number] DOB: August 20, 1985 DOA: 08/27/2013 PCP: No PCP Per Patient  Brief narrative: 28 year old male with [past medical history of narcotic use / abuse, Crohn's disease who [facility] with abdominal pain and was found to have colitis as evidenced on CT abdomen. GI is assisting the management.   Assessment/Plan:   Principal Problem:  Abdominal pain / Crohn's colitis  - secondary to crohn's colitis. CT abdomen with evidence of diffuse colitis more prominent in cecum.  - on Lialda, MTX and solumedrol per GI recommendations  - Diet advanced to as tolerated however patient still has abdominal pain and could not tolerate a regular diet. So far on clear liquids.  Active Problems:  Anemia of  chronic disease  disease  - secondary to Crohn's disease - hemoglobin stable at 9.3  - no current indications for transfusion  - Check CBC in the morning  Hypertension - Added low dose Norvasc, 2.5 mg daily  DVT prophylaxis: SCD's bilaterally.   Code Status: full code  Family Communication: plan of care discussed with the patient  Disposition Plan: home when stable   Consultants:  Gastroenterology  Procedures:  None  Antibiotics:  None   Manson PasseyEVINE, ALMA, MD  [facility] Pager [phone]  If 7PM-7AM, please contact night-coverage [email or web address] Password [email or web address] 08/31/2013, 10:40 AM   LOS: 4 days    HPI/Subjective: No acute overnight events.  Objective: Filed Vitals:   08/30/13 0550 08/30/13 1339 08/30/13 2145 08/31/13 0526  BP: 149/82 154/80 151/91 161/89  Pulse: 63 72 67 58  Temp: 98.3 F (36.8 C) 97.8 F (36.6 C) 98.4 F (36.9 C) 98 F (36.7 C)  TempSrc: Oral Oral Oral Oral  Resp: 20 20 18 12   Height:      Weight:      SpO2: 100% 100% 100% 100%    Intake/Output Summary (Last 24 hours) at 08/31/13 1040 Last data filed at 08/30/13  1341  Gross per 24 hour  Intake    240 ml  Output      0 ml  Net    240 ml    Exam:   General:  Pt is alert, follows commands appropriately, not in acute distress  Cardiovascular: Regular rate and rhythm, S1/S2, no murmurs  Respiratory: Clear to auscultation bilaterally, no wheezing, no crackles, no rhonchi  Abdomen: Soft, , tender in the midabdomen  non distended, bowel sounds present  Extremities: No edema, pulses DP and PT palpable bilaterally  Neuro: Grossly nonfocal  Data Reviewed: Basic Metabolic Panel:  Recent Labs Lab 08/27/13 1535 08/28/13 0055 08/29/13 0520  NA 138 138 141  K 4.0 3.7 4.0  CL 97 101 103  CO2 27 23 26   GLUCOSE 129* 131* 87  BUN 6 7 6   CREATININE 0.72 0.62 0.64  CALCIUM 9.4 9.2 9.1   Liver Function Tests:  Recent Labs Lab 08/27/13 1535 08/28/13 0055  AST 14 17  ALT 8 10  ALKPHOS 38* 38*  BILITOT <0.2* <0.2*  PROT 8.1 8.0  ALBUMIN 3.3* 3.3*    Recent Labs Lab 08/27/13 1535  LIPASE 39   No results found for this basename: AMMONIA,  in the last 168 hours CBC:  Recent Labs Lab 08/27/13 1535 08/28/13 0055 08/29/13 0520  WBC 10.9* 12.3*  12.3* 13.2*  NEUTROABS 9.5*  --   --   HGB 9.7* 9.4*  9.4*  9.3*  HCT 32.4* 30.7*  30.5* 31.4*  MCV 63.5* 63.4*  62.5* 64.6*  PLT 622* 577*  563* 508*   Cardiac Enzymes: No results found for this basename: CKTOTAL, CKMB, CKMBINDEX, TROPONINI,  in the last 168 hours BNP: No components found with this basename: POCBNP,  CBG:  Recent Labs Lab 08/28/13 0034 08/28/13 0640 08/28/13 1214 08/30/13 0554 08/31/13 0747  GLUCAP 136* 124* 95 100* 85    CULTURE, BLOOD (ROUTINE X 2)     Status: None   Collection Time    08/28/13 12:50 AM      Result Value Ref Range Status   Specimen Description BLOOD LEFT HAND   Final   Value:        BLOOD CULTURE RECEIVED NO GROWTH TO DATE CULTURE WILL BE HELD FOR 5 DAYS BEFORE ISSUING A FINAL NEGATIVE REPORT     Performed at [facility]    Report Status PENDING   Incomplete  CULTURE, BLOOD (ROUTINE X 2)     Status: None   Collection Time    08/28/13 12:50 AM      Result Value Ref Range Status   Specimen Description BLOOD LEFT ARM   Final   Value:        BLOOD CULTURE RECEIVED NO GROWTH TO DATE CULTURE WILL BE HELD FOR 5 DAYS BEFORE ISSUING A FINAL NEGATIVE REPORT     Performed at [facility]   Report Status PENDING   Incomplete  CLOSTRIDIUM DIFFICILE BY PCR     Status: None   Collection Time    08/28/13  3:02 AM      Result Value Ref Range Status   C difficile by pcr NEGATIVE  NEGATIVE Final   Comment: Performed at [facility]  STOOL CULTURE     Status: None   Collection Time    08/28/13  3:02 AM      Result Value Ref Range Status   Specimen Description STOOL   Final   Value: NO SUSPICIOUS COLONIES, CONTINUING TO HOLD     Performed at [facility]   Report Status PENDING   Incomplete     Studies: No results found.  Scheduled Meds: . iron polysaccharides  150 mg Oral BID  . lactose free nutrition  237 mL Oral TID WC  . mesalamine  4.8 g Oral Q breakfast  . methotrexate  25 mg Subcutaneous Weekly  . methylPREDNISolone  40 mg Intravenous Q24H

## 2013-08-31 NOTE — Progress Notes (Signed)
Patient ID: Troy Ramirez, male   DOB: 1985-03-22, 28 y.o.   MRN: [id number] [facility] Progress Note  Subjective: About the same- trying to eat  A little- still hurting and very hesitant to eat  Objective:  Vital signs in last 24 hours: Temp:  [97.8 F (36.6 C)-98.4 F (36.9 C)] 98 F (36.7 C) (06/26 0526) Pulse Rate:  [58-72] 58 (06/26 0526) Resp:  [12-20] 12 (06/26 0526) BP: (151-161)/(80-91) 161/89 mmHg (06/26 0526) SpO2:  [100 %] 100 % (06/26 0526) Last BM Date: 08/30/13 General:   Alert,  Well-developed,  WM  in NAD Heart:  Regular rate and rhythm; no murmurs Pulm;clear Abdomen:  Soft, tender rather diffusely, and nondistended. Normal bowel sounds, without guarding, and without rebound.   Extremities:  Without edema. Neurologic:  Alert and  oriented x4;  grossly normal neurologically. Psych:  Alert and cooperative. Affect flat, somewhat irritable Intake/Output from previous day: 06/25 0701 - 06/26 0700 In: 240 [P.O.:240] Out: -  Intake/Output this shift:    Lab Results:  Recent Labs  08/29/13 0520  WBC 13.2*  HGB 9.3*  HCT 31.4*  PLT 508*   BMET  Recent Labs  08/29/13 0520  NA 141  K 4.0  CL 103  CO2 26  GLUCOSE 87  BUN 6  CREATININE 0.64  CALCIUM 9.1    Assessment / Plan: #1  28 yo male with Crohns colitis with exacerbation/universal colitis Making very slow progress-day # 4 hospital stay Continue IV Solumedrol/ Lialda Methotrexate He may  need 3-4 more days before discharge  #2 anemia/fe deficiency- on Niferex #3 Chronic pain/ hx narcotic dependence- has been acepted to pain clinic-see notes- pt needs to call to get appt- encouraged him to do this today #4 IBD associated arthropathy Principal Problem:   Abdominal pain Active Problems:   Crohn's colitis   Joint pain   Anemia     LOS: 4 days   [name]  08/31/2013, 10:06 AM

## 2013-08-31 NOTE — Progress Notes (Signed)
Clinical Social Work  CSW met with patient at bedside. Patient laying in bed and reports he did not sleep well and is not feeling well today. Patient has flat affect and minimally engaged. CSW explained that CSW spoke with [facility] who offers free individual and group therapy for bereavement counseling. Patient reports he remains interested in services and knows that he needs to address his mental health needs in order help his physical needs. Patient appreciative of information and has CSW contact information if further needs arise.  [place], [place] [phone]

## 2013-08-31 NOTE — Progress Notes (Signed)
Patient seen, examined, and I agree with the above documentation, including the assessment and plan. Slow progress, advancing diet slowly Continue IV steroids with plans in the next several days to convert to oral prednisone 40 mg daily Weekly methotrexate Need to wean off IV narcotics We will be available over the weekend, call with questions

## 2013-09-01 DIAGNOSIS — R1084 Generalized abdominal pain: Secondary | ICD-10-CM

## 2013-09-01 LAB — CBC
HCT: 33.7 % — ABNORMAL LOW (ref 39.0–52.0)
Hemoglobin: 10 g/dL — ABNORMAL LOW (ref 13.0–17.0)
MCH: 19 pg — ABNORMAL LOW (ref 26.0–34.0)
MCHC: 29.7 g/dL — ABNORMAL LOW (ref 30.0–36.0)
MCV: 63.9 fL — ABNORMAL LOW (ref 78.0–100.0)
PLATELETS: 750 10*3/uL — AB (ref 150–400)
RBC: 5.27 MIL/uL (ref 4.22–5.81)
RDW: 17.2 % — ABNORMAL HIGH (ref 11.5–15.5)
WBC: 12.3 10*3/uL — AB (ref 4.0–10.5)

## 2013-09-01 LAB — BASIC METABOLIC PANEL
BUN: 11 mg/dL (ref 6–23)
CHLORIDE: 97 meq/L (ref 96–112)
CO2: 29 meq/L (ref 19–32)
Calcium: 9.7 mg/dL (ref 8.4–10.5)
Creatinine, Ser: 0.72 mg/dL (ref 0.50–1.35)
GFR calc non Af Amer: >90 mL/min (ref >90)
Glucose, Bld: 92 mg/dL (ref 70–99)
POTASSIUM: 4.2 meq/L (ref 3.7–5.3)
Sodium: 137 mEq/L (ref 137–147)

## 2013-09-01 LAB — STOOL CULTURE

## 2013-09-01 NOTE — Progress Notes (Signed)
Patient ID: Troy MoatSean Haith, male   DOB: 10-01-1985, 28 y.o.   MRN: [id number] [facility] PROGRESS NOTE  Troy MoatSean Griswold WUJ:[id number] DOB: 10-01-1985 DOA: 08/27/2013 PCP: No PCP Per Patient will set him up with community clinic on discharge   Brief narrative: 28 year old male with [past medical history of narcotic use / abuse, Crohn's disease who [facility] 08/27/2013 with abdominal pain and was found to have colitis as evidenced on CT abdomen. GI is assisting the management.   Assessment/Plan:   Principal Problem:  Abdominal pain / Crohn's colitis  - secondary to crohn's colitis. CT abdomen with evidence of diffuse colitis more prominent in cecum.  - on Lialda, MTX and solumedrol per GI recommendations  - feeling little better this am, tolerated regular diet Active Problems:  Anemia of chronic disease disease  - secondary to Crohn's disease  - hemoglobin stable at 9.3, 10 - no current indications for transfusion  Hypertension  - Added low dose Norvasc, 2.5 mg daily  - BP at goal this am  DVT prophylaxis: SCD's bilaterally.   Code Status: full code  Family Communication: plan of care discussed with the patient  Disposition Plan: home when stable   Consultants:  Gastroenterology  Procedures:  None  Antibiotics:  None    Manson PasseyEVINE, ALMA, MD  [facility] Pager [phone]  If 7PM-7AM, please contact night-coverage [email or web address] Password [email or web address] 09/01/2013, 12:01 PM   LOS: 5 days    HPI/Subjective: No acute overnight events.  Objective: Filed Vitals:   08/31/13 1327 08/31/13 2226 09/01/13 0554 09/01/13 0940  BP: 147/93 131/92 132/84 134/78  Pulse: 59 87 77   Temp: 97.5 F (36.4 C) 98.1 F (36.7 C) 97.7 F (36.5 C)   TempSrc: Oral Oral Oral   Resp: 18 18 18    Height:      Weight:      SpO2: 100% 100% 98%    No intake or output data in the 24 hours ending 09/01/13 1201  Exam:   General:  Pt is alert, follows commands appropriately, not in acute  distress  Cardiovascular: Regular rate and rhythm, S1/S2, no murmurs  Respiratory: Clear to auscultation bilaterally, no wheezing, no crackles, no rhonchi  Abdomen: Soft, non tender, non distended, bowel sounds present  Extremities: No edema, pulses DP and PT palpable bilaterally  Neuro: Grossly nonfocal  Data Reviewed: Basic Metabolic Panel:  Recent Labs Lab 08/27/13 1535 08/28/13 0055 08/29/13 0520 09/01/13 0542  NA 138 138 141 137  K 4.0 3.7 4.0 4.2  CL 97 101 103 97  CO2 27 23 26 29   GLUCOSE 129* 131* 87 92  BUN 6 7 6 11   CREATININE 0.72 0.62 0.64 0.72  CALCIUM 9.4 9.2 9.1 9.7   Liver Function Tests:  Recent Labs Lab 08/27/13 1535 08/28/13 0055  AST 14 17  ALT 8 10  ALKPHOS 38* 38*  BILITOT <0.2* <0.2*  PROT 8.1 8.0  ALBUMIN 3.3* 3.3*    Recent Labs Lab 08/27/13 1535  LIPASE 39   No results found for this basename: AMMONIA,  in the last 168 hours CBC:  Recent Labs Lab 08/27/13 1535 08/28/13 0055 08/29/13 0520 09/01/13 0542  WBC 10.9* 12.3*  12.3* 13.2* 12.3*  NEUTROABS 9.5*  --   --   --   HGB 9.7* 9.4*  9.4* 9.3* 10.0*  HCT 32.4* 30.7*  30.5* 31.4* 33.7*  MCV 63.5* 63.4*  62.5* 64.6* 63.9*  PLT 622* 577*  563* 508* 750*   Cardiac  Enzymes: No results found for this basename: CKTOTAL, CKMB, CKMBINDEX, TROPONINI,  in the last 168 hours BNP: No components found with this basename: POCBNP,  CBG:  Recent Labs Lab 08/28/13 0034 08/28/13 0640 08/28/13 1214 08/30/13 0554 08/31/13 0747  GLUCAP 136* 124* 95 100* 85    Recent Results (from the past 240 hour(s))  CULTURE, BLOOD (ROUTINE X 2)     Status: None   Collection Time    08/28/13 12:50 AM      Result Value Ref Range Status   Specimen Description BLOOD LEFT HAND   Final   Special Requests BOTTLES DRAWN AEROBIC ONLY 10CC   Final   Culture  Setup Time     Final   Value: 08/28/2013 04:21     Performed at [facility]   Culture     Final   Value:        BLOOD CULTURE  RECEIVED NO GROWTH TO DATE CULTURE WILL BE HELD FOR 5 DAYS BEFORE ISSUING A FINAL NEGATIVE REPORT     Performed at [facility]   Report Status PENDING   Incomplete  CULTURE, BLOOD (ROUTINE X 2)     Status: None   Collection Time    08/28/13 12:50 AM      Result Value Ref Range Status   Specimen Description BLOOD LEFT ARM   Final   Special Requests BOTTLES DRAWN AEROBIC ONLY 10CC   Final   Culture  Setup Time     Final   Value: 08/28/2013 04:21     Performed at [facility]   Culture     Final   Value:        BLOOD CULTURE RECEIVED NO GROWTH TO DATE CULTURE WILL BE HELD FOR 5 DAYS BEFORE ISSUING A FINAL NEGATIVE REPORT     Performed at [facility]   Report Status PENDING   Incomplete  CLOSTRIDIUM DIFFICILE BY PCR     Status: None   Collection Time    08/28/13  3:02 AM      Result Value Ref Range Status   C difficile by pcr NEGATIVE  NEGATIVE Final   Comment: Performed at [facility]  STOOL CULTURE     Status: None   Collection Time    08/28/13  3:02 AM      Result Value Ref Range Status   Specimen Description STOOL   Final   Special Requests NONE   Final   Culture     Final   Value: NO SUSPICIOUS COLONIES, CONTINUING TO HOLD     Performed at [facility]   Report Status PENDING   Incomplete     Studies: No results found.  Scheduled Meds: . amLODipine  2.5 mg Oral Daily  . iron polysaccharides  150 mg Oral BID  . lactose free nutrition  237 mL Oral TID WC  . mesalamine  4.8 g Oral Q breakfast  . methotrexate  25 mg Subcutaneous Weekly  . methylPREDNISolone (SOLU-MEDROL) injection  40 mg Intravenous Q24H   Continuous Infusions:

## 2013-09-02 LAB — BASIC METABOLIC PANEL
BUN: 14 mg/dL (ref 6–23)
CALCIUM: 9.5 mg/dL (ref 8.4–10.5)
CO2: 27 mEq/L (ref 19–32)
Chloride: 97 mEq/L (ref 96–112)
Creatinine, Ser: 0.71 mg/dL (ref 0.50–1.35)
GFR calc Af Amer: >90 mL/min (ref >90)
Glucose, Bld: 88 mg/dL (ref 70–99)
Potassium: 3.9 mEq/L (ref 3.7–5.3)
Sodium: 137 mEq/L (ref 137–147)

## 2013-09-02 LAB — CBC
HEMATOCRIT: 32.9 % — AB (ref 39.0–52.0)
Hemoglobin: 10.3 g/dL — ABNORMAL LOW (ref 13.0–17.0)
MCH: 19.7 pg — ABNORMAL LOW (ref 26.0–34.0)
MCHC: 31.3 g/dL (ref 30.0–36.0)
MCV: 62.9 fL — AB (ref 78.0–100.0)
Platelets: 793 10*3/uL — ABNORMAL HIGH (ref 150–400)
RBC: 5.23 MIL/uL (ref 4.22–5.81)
RDW: 17.2 % — ABNORMAL HIGH (ref 11.5–15.5)
WBC: 14.9 10*3/uL — AB (ref 4.0–10.5)

## 2013-09-02 LAB — GLUCOSE, CAPILLARY: Glucose-Capillary: 98 mg/dL (ref 70–99)

## 2013-09-02 MED ORDER — SIMETHICONE 80 MG PO CHEW
80.0000 mg | CHEWABLE_TABLET | Freq: Four times a day (QID) | ORAL | Status: DC | PRN
  Administered 2013-09-02 – 2013-09-03 (×2): 80 mg via ORAL
  Filled 2013-09-02 (×3): qty 1

## 2013-09-02 MED ORDER — AMLODIPINE BESYLATE 5 MG PO TABS
5.0000 mg | ORAL_TABLET | Freq: Every day | ORAL | Status: DC
  Administered 2013-09-03: 5 mg via ORAL
  Filled 2013-09-02: qty 1

## 2013-09-02 NOTE — Progress Notes (Signed)
Patient ID: Troy Ramirez, male   DOB: 20-Feb-1986, 28 y.o.   MRN: [id number] [facility] PROGRESS NOTE  Troy Ramirez HOZ:[id number] DOB: [birth year] DOA: 08/27/2013 PCP: No PCP Per Patient  Brief narrative: 28 year old male with [past medical history of narcotic use / abuse, Crohn's disease who presented to [facility] ED 08/27/2013 with abdominal pain and was found to have colitis as evidenced on CT abdomen. GI is assisting the management.   Assessment/Plan:   Principal Problem:  Abdominal pain / Crohn's colitis  - secondary to crohn's colitis. CT abdomen with evidence of diffuse colitis more prominent in cecum.  - on Lialda, MTX and solumedrol per GI recommendations; will see with GI if we can transition to PO prednisone and hopefully d/c home in next 24 hours - tolerates regular diet  Active Problems:  Anemia of chronic disease disease  - secondary to Crohn's disease  - hemoglobin stable   - no current indications for transfusion  Hypertension  - increased Norvasc to 5 mg daily from initial dose of 2.5 mg daily   DVT prophylaxis: SCD's bilaterally.   Code Status: full code  Family Communication: plan of care discussed with the patient  Disposition Plan: home when stable   Consultants:  Gastroenterology  Procedures:  None  Antibiotics:  None    Manson Passey, MD  [facility] Pager [phone]  If 7PM-7AM, please contact night-coverage [email or web address] Password [email or web address] 09/02/2013, 12:08 PM   LOS: 6 days   HPI/Subjective: No acute overnight events.  Objective: Filed Vitals:   09/01/13 0940 09/01/13 1419 09/01/13 2133 09/02/13 0538  BP: 134/78 147/98 153/89 151/103  Pulse:  87 65 78  Temp:  97.2 F (36.2 C) 98 F (36.7 C) 97.6 F (36.4 C)  TempSrc:  Oral Oral Oral  Resp:  18 18 18   Height:      Weight:      SpO2:  100% 100% 98%    Intake/Output Summary (Last 24 hours) at 09/02/13 1208 Last data filed at 09/02/13 0900  Gross per 24 hour  Intake    540 ml  Output       1 ml  Net    539 ml    Exam:   General:  Pt is alert, follows commands appropriately, not in acute distress  Cardiovascular: Regular rate and rhythm, S1/S2, no murmurs  Respiratory: Clear to auscultation bilaterally, no wheezing, no crackles, no rhonchi  Abdomen: Soft, non tender, non distended, bowel sounds present  Extremities: No edema, pulses DP and PT palpable bilaterally  Neuro: Grossly nonfocal  Data Reviewed: Basic Metabolic Panel:  Recent Labs Lab 08/27/13 1535 08/28/13 0055 08/29/13 0520 09/01/13 0542 09/02/13 0603  NA 138 138 141 137 137  K 4.0 3.7 4.0 4.2 3.9  CL 97 101 103 97 97  CO2 27 23 26 29 27   GLUCOSE 129* 131* 87 92 88  BUN 6 7 6 11 14   CREATININE 0.72 0.62 0.64 0.72 0.71  CALCIUM 9.4 9.2 9.1 9.7 9.5   Liver Function Tests:  Recent Labs Lab 08/27/13 1535 08/28/13 0055  AST 14 17  ALT 8 10  ALKPHOS 38* 38*  BILITOT <0.2* <0.2*  PROT 8.1 8.0  ALBUMIN 3.3* 3.3*    Recent Labs Lab 08/27/13 1535  LIPASE 39   No results found for this basename: AMMONIA,  in the last 168 hours CBC:  Recent Labs Lab 08/27/13 1535 08/28/13 0055 08/29/13 0520 09/01/13 0542 09/02/13 0603  WBC 10.9* 12.3*  12.3* 13.2* 12.3*  14.9*  NEUTROABS 9.5*  --   --   --   --   HGB 9.7* 9.4*  9.4* 9.3* 10.0* 10.3*  HCT 32.4* 30.7*  30.5* 31.4* 33.7* 32.9*  MCV 63.5* 63.4*  62.5* 64.6* 63.9* 62.9*  PLT 622* 577*  563* 508* 750* 793*   Cardiac Enzymes: No results found for this basename: CKTOTAL, CKMB, CKMBINDEX, TROPONINI,  in the last 168 hours BNP: No components found with this basename: POCBNP,  CBG:  Recent Labs Lab 08/28/13 0640 08/28/13 1214 08/30/13 0554 08/31/13 0747 09/02/13 0708  GLUCAP 124* 95 100* 85 98    Recent Results (from the past 240 hour(s))  CULTURE, BLOOD (ROUTINE X 2)     Status: None   Collection Time    08/28/13 12:50 AM      Result Value Ref Range Status   Specimen Description BLOOD LEFT HAND   Final    Special Requests BOTTLES DRAWN AEROBIC ONLY 10CC   Final   Culture  Setup Time     Final   Value: 08/28/2013 04:21     [facility]   Culture     Final   Value:        BLOOD CULTURE RECEIVED NO GROWTH TO DATE CULTURE WILL BE HELD FOR 5 DAYS BEFORE ISSUING A FINAL NEGATIVE REPORT     [facility]   Report Status PENDING   Incomplete  CULTURE, BLOOD (ROUTINE X 2)     Status: None   Collection Time    08/28/13 12:50 AM      Result Value Ref Range Status   Specimen Description BLOOD LEFT ARM   Final   Special Requests BOTTLES DRAWN AEROBIC ONLY 10CC   Final   Culture  Setup Time     Final   Value: 08/28/2013 04:21     [facility]formed at [facility]   Culture     Final   Value:        BLOOD CULTURE RECEIVED NO GROWTH TO DATE CULTURE WILL BE HELD FOR 5 DAYS BEFORE ISSUING A FINAL NEGATIVE REPORT     [facility]formed at [facility]   Report Status PENDING   Incomplete  CLOSTRIDIUM DIFFICILE BY PCR     Status: None   Collection Time    08/28/13  3:02 AM      Result Value Ref Range Status   C difficile by pcr NEGATIVE  NEGATIVE Final   Comment: [facility]formed [facility]  STOOL CULTURE     Status: None   Collection Time    08/28/13  3:02 AM      Result Value Ref Range Status   Specimen Description STOOL   Final   Special Requests NONE   Final   Culture     Final   Value: NO SALMONELLA, SHIGELLA, CAMPYLOBACTER, YERSINIA, OR E.COLI 0157:H7 ISOLATED     Performed at [facility]   Report Status 09/01/2013 FINAL   Final     Studies: No results found.  Scheduled Meds: . [START ON 09/03/2013] amLODipine  5 mg Oral Daily  . iron polysaccharides  150 mg Oral BID  . lactose free nutrition  237 mL Oral TID WC  . mesalamine  4.8 g Oral Q breakfast  . methotrexate  25 mg Subcutaneous Weekly  . methylPREDNISolone (SOLU-MEDROL) injection  40 mg Intravenous Q24H   Continuous Infusions:

## 2013-09-03 DIAGNOSIS — M2550 Pain in unspecified joint: Secondary | ICD-10-CM

## 2013-09-03 DIAGNOSIS — R109 Unspecified abdominal pain: Secondary | ICD-10-CM

## 2013-09-03 DIAGNOSIS — I10 Essential (primary) hypertension: Secondary | ICD-10-CM

## 2013-09-03 LAB — BASIC METABOLIC PANEL
BUN: 15 mg/dL (ref 6–23)
CO2: 27 mEq/L (ref 19–32)
Calcium: 9.9 mg/dL (ref 8.4–10.5)
Chloride: 98 mEq/L (ref 96–112)
Creatinine, Ser: 0.64 mg/dL (ref 0.50–1.35)
GFR calc Af Amer: >90 mL/min (ref >90)
GFR calc non Af Amer: >90 mL/min (ref >90)
Glucose, Bld: 86 mg/dL (ref 70–99)
Potassium: 3.9 mEq/L (ref 3.7–5.3)
Sodium: 139 mEq/L (ref 137–147)

## 2013-09-03 LAB — CBC
HCT: 35.6 % — ABNORMAL LOW (ref 39.0–52.0)
Hemoglobin: 10.7 g/dL — ABNORMAL LOW (ref 13.0–17.0)
MCH: 19 pg — AB (ref 26.0–34.0)
MCHC: 30.1 g/dL (ref 30.0–36.0)
MCV: 63.2 fL — ABNORMAL LOW (ref 78.0–100.0)
Platelets: 911 10*3/uL (ref 150–400)
RBC: 5.63 MIL/uL (ref 4.22–5.81)
RDW: 17.4 % — AB (ref 11.5–15.5)
WBC: 16.8 10*3/uL — ABNORMAL HIGH (ref 4.0–10.5)

## 2013-09-03 LAB — CULTURE, BLOOD (ROUTINE X 2)
CULTURE: NO GROWTH
Culture: NO GROWTH

## 2013-09-03 LAB — GLUCOSE, CAPILLARY: Glucose-Capillary: 93 mg/dL (ref 70–99)

## 2013-09-03 MED ORDER — AMLODIPINE BESYLATE 5 MG PO TABS: 5.0000 mg | ORAL_TABLET | Freq: Every day | ORAL | Status: DC

## 2013-09-03 MED ORDER — PREDNISONE 20 MG PO TABS: 40.0000 mg | ORAL_TABLET | Freq: Every day | ORAL | Status: DC

## 2013-09-03 MED ORDER — MESALAMINE 1.2 G PO TBEC: 4.8000 g | DELAYED_RELEASE_TABLET | Freq: Every day | ORAL | Status: DC

## 2013-09-03 MED ORDER — OXYCODONE HCL 5 MG PO TABS: 5.0000 mg | ORAL_TABLET | ORAL | Status: DC | PRN

## 2013-09-03 MED ORDER — POLYSACCHARIDE IRON COMPLEX 150 MG PO CAPS: 150.0000 mg | ORAL_CAPSULE | Freq: Two times a day (BID) | ORAL | Status: DC

## 2013-09-03 NOTE — Progress Notes (Signed)
Discharge paperwork and instructions reviewed with pt. Pt verbalizes understanding. Does not have any questions at this time. Pt has all belongings. Pt d/c home.

## 2013-09-03 NOTE — Discharge Summary (Signed)
Physician Discharge Summary  Troy Ramirez MWN:[id number] DOB: 05-Sep-1985 DOA: 08/27/2013  PCP: No PCP Per Patient  Admit date: 08/27/2013 Discharge date: 09/03/2013  Recommendations for Outpatient Follow-up:  1. Continue prednisone 40 gm daily until seen in GI clinic in 3 weeks from discharge. GI will schedule an appt for you to see Dr. Rhea Ramirez in 3 weeks. 2. We started amlodipine 5 mg daily for blood pressure (BP) control. Please follow up with your PCP in next 2 weeks to make sure blood pressure is under control. Ideal BP should be 120/80.  Discharge Diagnoses:  Principal Problem:   Abdominal pain Active Problems:   Crohn's colitis   Joint pain   Anemia    Discharge Condition: stable   Diet recommendation: as tolerated   History of present illness:  28 year old male with [past medical history of narcotic use / abuse, Crohn's disease who presented to [facility] ED 08/27/2013 with abdominal pain and was found to have colitis as evidenced on CT abdomen. GI is assisting the management.   Assessment/Plan:   Principal Problem:  Abdominal pain / Crohn's colitis  - secondary to crohn's colitis. CT abdomen with evidence of diffuse colitis more prominent in cecum.  - on Lialda, MTX and solumedrol per GI recommendations; cont prednisone as indicated above  - tolerates regular diet   Active Problems:  Anemia of chronic disease disease  - secondary to Crohn's disease  - hemoglobin stable  - no current indications for transfusion  Hypertension  - increased Norvasc to 5 mg daily    DVT prophylaxis: SCD's bilaterally wj=hile pt is in hospital; encourage ambulation   Code Status: full code  Family Communication: plan of care discussed with the patient   Consultants:  Gastroenterology  Procedures:  None  Antibiotics:  None   Signed:  Manson Passey, MD  [facility] 09/03/2013, 1:29 PM  Pager #: [phone]   Discharge Exam: Filed Vitals:   09/03/13 0524  BP: 155/79   Pulse: 61  Temp: 97.9 F (36.6 C)  Resp: 18   Filed Vitals:   09/02/13 0538 09/02/13 1500 09/02/13 2131 09/03/13 0524  BP: 151/103 142/88 144/92 155/79  Pulse: 78 84 93 61  Temp: 97.6 F (36.4 C) 98.1 F (36.7 C) 98.2 F (36.8 C) 97.9 F (36.6 C)  TempSrc: Oral Oral Oral Oral  Resp: 18 18 18 18   Height:      Weight:      SpO2: 98% 99% 100% 100%    General: Pt is alert, follows commands appropriately, not in acute distress Cardiovascular: Regular rate and rhythm, S1/S2 +, no murmurs Respiratory: Clear to auscultation bilaterally, no wheezing, no crackles, no rhonchi Abdominal: Soft, non tender, non distended, bowel sounds +, no guarding Extremities: no edema, no cyanosis, pulses palpable bilaterally DP and PT Neuro: Grossly nonfocal  Discharge Instructions  Discharge Instructions   Call MD for:  difficulty breathing, headache or visual disturbances    Complete by:  As directed      Call MD for:  persistant dizziness or light-headedness    Complete by:  As directed      Call MD for:  persistant nausea and vomiting    Complete by:  As directed      Call MD for:  severe uncontrolled pain    Complete by:  As directed      Diet - low sodium heart healthy    Complete by:  As directed      Discharge instructions  Complete by:  As directed   1. Continue prednisone 40 gm daily until seen in GI clinic in 3 weeks from discharge. GI will schedule an appt for you to see Dr. Rhea BeltonPyrtle in 3 weeks. 2. We started amlodipine 5 mg daily for blood pressure (BP) control. Please follow up with your PCP in next 2 weeks to make sure blood pressure is under control. Ideal BP should be 120/80.     Increase activity slowly    Complete by:  As directed             Medication List         acetaminophen 500 MG tablet  Commonly known as:  TYLENOL  Take 500-1,000 mg by mouth every 6 (six) hours as needed for pain.     amLODipine 5 MG tablet  Commonly known as:  NORVASC  Take 1 tablet (5  mg total) by mouth daily.     iron polysaccharides 150 MG capsule  Commonly known as:  NIFEREX  Take 1 capsule (150 mg total) by mouth 2 (two) times daily at 10 AM and 5 PM.     mesalamine 1.2 G EC tablet  Commonly known as:  LIALDA  Take 4 tablets (4.8 g total) by mouth daily with breakfast.     oxyCODONE 5 MG immediate release tablet  Commonly known as:  Oxy IR/ROXICODONE  Take 1 tablet (5 mg total) by mouth every 4 (four) hours as needed for moderate pain or severe pain.     predniSONE 20 MG tablet  Commonly known as:  DELTASONE  Take 2 tablets (40 mg total) by mouth daily before breakfast.  Start taking on:  09/12/2013     vitamin C 500 MG tablet  Commonly known as:  ASCORBIC ACID  Take 1,000 mg by mouth daily.           Follow-up Information   Follow up with Troy GipAmy Esterwood, PA-C On 09/17/2013. (9:00 am)    Specialty:  Gastroenterology   Contact information:   [address] [place] [address] [phone]        The results of significant diagnostics from this hospitalization (including imaging, microbiology, ancillary and laboratory) are listed below for reference.    Significant Diagnostic Studies: Ct Abdomen Pelvis W Contrast  08/28/2013   CLINICAL DATA:  Pain.  EXAM: CT ABDOMEN AND PELVIS WITH CONTRAST  TECHNIQUE: Multidetector CT imaging of the abdomen and pelvis was performed using the standard protocol following bolus administration of intravenous contrast.  CONTRAST:  100mL OMNIPAQUE IOHEXOL 300 MG/ML  SOLN  COMPARISON:  CT 08/29/2012.  FINDINGS: Liver normal. Spleen normal. Pancreas normal. No biliary distention. Gallbladder is nondistended.  Adrenals normal. Punctate 2 mm lucency in the right kidney, too small for further evaluation, most likely tiny cyst. No hydronephrosis. No evidence of obstructing ureteral stone. Bladder is nondistended. Phleboliths.  No significant adenopathy. Aorta normal caliber. Visceral vessels are patent. Portal vein is patent.  What  appears to be the appendix is normal. Diffuse colonic wall thickening is again noted. Colonic wall thickening is particularly severe about the cecum. This is consistent with the patient's known Crohn's colitis. This may be chronic as noted on prior CT of 08/29/2012. There is no evidence of bowel obstruction. No free air. No mesenteric mass lesion. No evidence of significant hernia.  Heart size normal. Lung bases are clear. No acute bony abnormality.  IMPRESSION: 1. Diffuse colitis again noted. Changes are particularly prominent about the cecum. Similar findings  noted on the prior CT of 08/29/2012. These findings would be consistent with patient's known Crohn's colitis. 2. No other significant abnormality identified .   Electronically Signed   By: Maisie Fus  Register   On: 08/28/2013 10:35    Microbiology: Recent Results (from the past 240 hour(s))  CULTURE, BLOOD (ROUTINE X 2)     Status: None   Collection Time    08/28/13 12:50 AM      Result Value Ref Range Status   Specimen Description BLOOD LEFT HAND   Final   Special Requests BOTTLES DRAWN AEROBIC ONLY 10CC   Final   Culture  Setup Time     Final   Value: 08/28/2013 04:21     Performed at [facility]   Culture     Final   Value: NO GROWTH 5 DAYS     Performed at [facility]   Report Status 09/03/2013 FINAL   Final  CULTURE, BLOOD (ROUTINE X 2)     Status: None   Collection Time    08/28/13 12:50 AM      Result Value Ref Range Status   Specimen Description BLOOD LEFT ARM   Final   Special Requests BOTTLES DRAWN AEROBIC ONLY 10CC   Final   Culture  Setup Time     Final   Value: 08/28/2013 04:21     Performed at [facility]   Culture     Final   Value: NO GROWTH 5 DAYS     Performed at [facility]   Report Status 09/03/2013 FINAL   Final  CLOSTRIDIUM DIFFICILE BY PCR     Status: None   Collection Time    08/28/13  3:02 AM      Result Value Ref Range Status   C difficile by pcr NEGATIVE  NEGATIVE  Final   Comment: Performed at [facility]  STOOL CULTURE     Status: None   Collection Time    08/28/13  3:02 AM      Result Value Ref Range Status   Specimen Description STOOL   Final   Special Requests NONE   Final   Culture     Final   Value: NO SALMONELLA, SHIGELLA, CAMPYLOBACTER, YERSINIA, OR E.COLI 0157:H7 ISOLATED     Performed at [facility]   Report Status 09/01/2013 FINAL   Final     Labs: Basic Metabolic Panel:  Recent Labs Lab 08/28/13 0055 08/29/13 0520 09/01/13 0542 09/02/13 0603 09/03/13 0547  NA 138 141 137 137 139  K 3.7 4.0 4.2 3.9 3.9  CL 101 103 97 97 98  CO2 23 26 29 27 27   GLUCOSE 131* 87 92 88 86  BUN 7 6 11 14 15   CREATININE 0.62 0.64 0.72 0.71 0.64  CALCIUM 9.2 9.1 9.7 9.5 9.9   Liver Function Tests:  Recent Labs Lab 08/27/13 1535 08/28/13 0055  AST 14 17  ALT 8 10  ALKPHOS 38* 38*  BILITOT <0.2* <0.2*  PROT 8.1 8.0  ALBUMIN 3.3* 3.3*    Recent Labs Lab 08/27/13 1535  LIPASE 39   No results found for this basename: AMMONIA,  in the last 168 hours CBC:  Recent Labs Lab 08/27/13 1535 08/28/13 0055 08/29/13 0520 09/01/13 0542 09/02/13 0603 09/03/13 0547  WBC 10.9* 12.3*  12.3* 13.2* 12.3* 14.9* 16.8*  NEUTROABS 9.5*  --   --   --   --   --   HGB 9.7* 9.4*  9.4* 9.3* 10.0* 10.3* 10.7*  HCT 32.4* 30.7*  30.5* 31.4* 33.7* 32.9* 35.6*  MCV 63.5* 63.4*  62.5* 64.6* 63.9* 62.9* 63.2*  PLT 622* 577*  563* 508* 750* 793* 911*   Cardiac Enzymes: No results found for this basename: CKTOTAL, CKMB, CKMBINDEX, TROPONINI,  in the last 168 hours BNP: BNP (last 3 results) No results found for this basename: PROBNP,  in the last 8760 hours CBG:  Recent Labs Lab 08/28/13 1214 08/30/13 0554 08/31/13 0747 09/02/13 0708 09/03/13 0612  GLUCAP 95 100* 85 98 93    Time coordinating discharge: Over 30 minutes

## 2013-09-03 NOTE — Progress Notes (Signed)
This shift received critical lab value platelets 911. Hospitalist paged with results, Raymond Gurney. No new orders at this time. Oncoming RN, [place], received this information

## 2013-09-03 NOTE — Discharge Instructions (Signed)
Crohn's Disease  Crohn's disease is a long-term (chronic) soreness and redness (inflammation) of the intestines (bowel). It can affect any portion of the digestive tract, from the mouth to the anus. It can also cause problems outside the digestive tract. Crohn's disease is closely related to a disease called ulcerative colitis (together, these two diseases are called inflammatory bowel disease).   CAUSES   The cause of Crohn's disease is not known. One theory is that, in an easily affected person, the immune system is triggered to attack the body's own digestive tissue. Crohn's disease runs in families. It seems to be more common in certain geographic areas and amongst certain races. There are no clear-cut dietary causes.   SYMPTOMS   Crohn's disease can cause many different symptoms since it can affect many different parts of the body. Symptoms include:  · Fatigue.  · Weight loss.  · Chronic diarrhea, sometime bloody.  · Abdominal pain and cramps.  · Fever.  · Ulcers or canker sores in the mouth or rectum.  · Anemia (low red blood cells).  · Arthritis, skin problems, and eye problems may occur.  Complications of Crohn's disease can include:  · Series of holes (perforation) of the bowel.  · Portions of the intestines sticking to each other (adhesions).  · Obstruction of the bowel.  · Fistula formation, typically in the rectal area but also in other areas. A fistula is an opening between the bowels and the outside, or between the bowels and another organ.  · A painful crack in the mucous membrane of the anus (rectal fissure).  DIAGNOSIS   Your caregiver may suspect Crohn's disease based on your symptoms and an exam. Blood tests may confirm that there is a problem. You may be asked to submit a stool specimen for examination. X-rays and CT scans may be necessary. Ultimately, the diagnosis is usually made after a procedure that uses a flexible tube that is inserted via your mouth or your anus. This is done under  sedation and is called either an upper endoscopy or colonoscopy. With these tests, the specialist can take tiny tissue samples and remove them from the inside of the bowel (biopsy). Examination of this biopsy tissue under a microscope can reveal Crohn's disease as the cause of your symptoms.  Due to the many different forms that Crohn's disease can take, symptoms may be present for several years before a diagnosis is made.  TREATMENT   Medications are often used to decrease inflammation and control the immune system. These include medicines related to aspirin, steroid medications, and newer and stronger medications to slow down the immune system. Some medications may be used as suppositories or enemas. A number of other medications are used or have been studied. Your caregiver will make specific recommendations.  HOME CARE INSTRUCTIONS   · Symptoms such as diarrhea can be controlled with medications. Avoid foods that have a laxative effect such as fresh fruit, vegetables and dairy products. During flare ups, you can rest your bowel by refraining from solid foods. Drink clear liquids frequently during the day (electrolyte or re-hydrating fluids are best. Your caregiver can help you with suggestions). Drink often to prevent loss of body fluids (dehydration). When diarrhea has cleared, eat small meals and more frequently. Avoid food additives and stimulants such as caffeine (coffee, tea, or chocolate). Enzyme supplements may help if you develop intolerance to a sugar in dairy products (lactose). Ask your caregiver or dietitian about specific dietary instructions.  · Try   to maintain a positive attitude. Learn relaxation techniques such as self hypnosis, mental imaging, and muscle relaxation.  · If possible, avoid stresses which can aggravate your condition.  · Exercise regularly.  · Follow your diet.  · Always get plenty of rest.  SEEK MEDICAL CARE IF:   · Your symptoms fail to improve after a week or two of new  treatment.  · You experience continued weight loss.  · You have ongoing cramps or loose bowels.  · You develop a new skin rash, skin sores, or eye problems.  SEEK IMMEDIATE MEDICAL CARE IF:   · You have worsening of your symptoms or develop new symptoms.  · You have a fever.  · You develop bloody diarrhea.  · You develop severe abdominal pain.  MAKE SURE YOU:   · Understand these instructions.  · Will watch your condition.  · Will get help right away if you are not doing well or get worse.  Document Released: 12/02/2004 Document Revised: 06/19/2012 Document Reviewed: 10/31/2006  ExitCare® Patient Information ©2015 [employer]. This information is not intended to replace advice given to you by your health care provider. Make sure you discuss any questions you have with your health care provider.

## 2013-09-03 NOTE — Progress Notes (Signed)
     [facility] Progress Note  Subjective:  Says that he actually is feeling much better today.  Pain is less, mostly gas pain.  Having average of 3 BM's per day.  Objective:  Vital signs in last 24 hours: Temp:  [97.9 F (36.6 C)-98.2 F (36.8 C)] 97.9 F (36.6 C) (06/29 0524) Pulse Rate:  [61-93] 61 (06/29 0524) Resp:  [18] 18 (06/29 0524) BP: (142-155)/(79-92) 155/79 mmHg (06/29 0524) SpO2:  [99 %-100 %] 100 % (06/29 0524) Last BM Date: 09/01/13 General:  Alert, Well-developed, in NAD Heart:  Regular rate and rhythm; no murmurs Pulm:  CTAB.  No W/R/R. Abdomen:  Soft, non-distended. Normal bowel sounds.  Non-tender. Extremities:  Without edema. Neurologic:  Alert and  oriented x4;  grossly normal neurologically. Psych:  Alert and cooperative. Normal mood and affect.  Intake/Output from previous day: 06/28 0701 - 06/29 0700 In: 720 [P.O.:720] Out: 3 [Urine:3]  Lab Results:  Recent Labs  09/01/13 0542 09/02/13 0603 09/03/13 0547  WBC 12.3* 14.9* 16.8*  HGB 10.0* 10.3* 10.7*  HCT 33.7* 32.9* 35.6*  PLT 750* 793* 911*   BMET  Recent Labs  09/01/13 0542 09/02/13 0603 09/03/13 0547  NA 137 137 139  K 4.2 3.9 3.9  CL 97 97 98  CO2 29 27 27   GLUCOSE 92 88 86  BUN 11 14 15   CREATININE 0.72 0.71 0.64  CALCIUM 9.7 9.5 9.9   Assessment / Plan: #1 28 yo male with Crohns colitis with exacerbation/universal colitis.  Actually feeling better today-day #7 hospital stay.  Continue Lialda/Methotrexate.  Will discontinue IV solumedrol and start prednisone 40 mg daily with anticipation for discharge with the next 24 hours. #2 anemia/fe deficiency- on Niferex  #3 Chronic pain/ hx narcotic dependence- has been accepted to pain clinic-see notes-has an appt there next week. #4 IBD associated arthropathy. #5 Leukocytosis:  Likely secondary to steroids.  *Just of note, patient wishes to be seen periodically at [facility], but wants to follow here in [place]  for routine GI care.   LOS: 7 days   ZEHR, JESSICA D.  09/03/2013, 8:27 AM  Pager number [phone]   GI ATTENDING  Interval history data reviewed. Patient personally seen and examined. Agree with H&P as above. I saw the patient over a year ago when he was hospitalized with Crohn's flare. Difficult management case. Has been seen multiple places. I saw him once in the office. He never followed up thereafter as requested. He is multiple reasons-insurance issues, high cost, relationship problems, stress at work, Catering manager. States he put himself on a gluten-free diet and did well. In any event, comes in with flare of disease. Now on prednisone. Methotrexate resumed. He has been given a followup appointment in our office in 2 weeks. Importance of medical compliance stressed. Also, I informed him that with his severe disease, that may be best if he received his Crohn's care at the [facility] in this case). Will sign off  John N. Eda Keys., M.D. [facility] Division of Gastroenterology

## 2013-09-04 LAB — PATHOLOGIST SMEAR REVIEW

## 2013-09-17 ENCOUNTER — Ambulatory Visit (INDEPENDENT_AMBULATORY_CARE_PROVIDER_SITE_OTHER): Payer: 59 | Admitting: Physician Assistant

## 2013-09-17 ENCOUNTER — Encounter: Payer: Self-pay | Admitting: Physician Assistant

## 2013-09-17 VITALS — BP 110/84 | HR 100 | Ht 74.0 in | Wt 180.0 lb

## 2013-09-17 DIAGNOSIS — K50118 Crohn's disease of large intestine with other complication: Secondary | ICD-10-CM

## 2013-09-17 DIAGNOSIS — M0760 Enteropathic arthropathies, unspecified site: Secondary | ICD-10-CM

## 2013-09-17 DIAGNOSIS — K639 Disease of intestine, unspecified: Secondary | ICD-10-CM

## 2013-09-17 DIAGNOSIS — D509 Iron deficiency anemia, unspecified: Secondary | ICD-10-CM

## 2013-09-17 DIAGNOSIS — K5010 Crohn's disease of large intestine without complications: Secondary | ICD-10-CM

## 2013-09-17 MED ORDER — MESALAMINE 1.2 G PO TBEC: DELAYED_RELEASE_TABLET | ORAL | Status: DC

## 2013-09-17 MED ORDER — PREDNISONE 20 MG PO TABS: ORAL_TABLET | ORAL | Status: DC

## 2013-09-17 MED ORDER — METHOTREXATE (PF) 25 MG/0.4ML ~~LOC~~ SOAJ: 1.0000 | SUBCUTANEOUS | Status: DC

## 2013-09-17 MED ORDER — POLYSACCHARIDE IRON COMPLEX 150 MG PO CAPS: ORAL_CAPSULE | ORAL | Status: DC

## 2013-09-17 NOTE — Progress Notes (Signed)
Agree with initial assessment and plan 

## 2013-09-17 NOTE — Progress Notes (Signed)
Subjective:    Patient ID: Troy Ramirez, male    DOB: April 10, 1985, 28 y.o.   MRN: [id number]  HPI  Nasyr is a 28 year old white male known to Dr. Yancey Flemings with history of Crohn's ileocolitis. He was initially diagnosed in 2006. He is been seen by several gastroenterologists over the years and had been seen at [facility] by Dr. Gwenith Spitz. He was recently hospitalized with an exacerbation of his Crohn's colitis. He has history of chronic pain issues and what is felt to be an IBD related arthropathy and has had issues with narcotic addiction. He had been on methadone at one point which he says worked well for him but he stopped this in the fall of 2014. He had been on infliximab until he had infusion reaction a few years ago, there is question of loss of response to Humira, he took Cimzia short-term and tolerated this but apparently his insurance denied after the initial induction phase. He was started on methotrexate in November 2013 and tolerated this and says that it actually helped his arthralgias quite a bit he stopped apparently because of nausea. At the time he was hospitalized he had not been on any medications for several months and had been trying to manage with a gluten-free diet and marijuana. He says he got frustrated with all the hassles with the insurance, medications etc. and stopped everything. CT scan of the abdomen and pelvis during his hospitalization showed a universal colitis. He was started on IV Solu-Medrol which caused anxiety and difficulty sleeping, we restarted methotrexate and LialdaHe gradually improved. It was made clear to him that he would not get chronic narcotics once out of the hospital and he was established with the pain clinic here in [facility] for outpatient followup. He comes in today stating that he still he much better his abdominal pain is significantly improved. His bowel movements are formed he has not seen any blood, no fever, chills nausea etc, his appetite has been  good. He says he's been having some joint pains intermittently. He did not get a prescription for the methotrexate on discharge and so missed his dose last week but plans to started today after he gets a prescription. He is not taking any narcotics at this time and says he does not plan to followup with pain clinic.    Review of Systems  Constitutional: Negative.   HENT: Negative.   Eyes: Negative.   Respiratory: Negative.   Cardiovascular: Negative.   Gastrointestinal: Positive for abdominal pain and diarrhea.  Endocrine: Negative.   Genitourinary: Negative.   Musculoskeletal: Positive for arthralgias.  Skin: Negative.   Allergic/Immunologic: Negative.   Neurological: Negative.   Hematological: Negative.   Psychiatric/Behavioral: Negative.    Outpatient Prescriptions Prior to Visit  Medication Sig Dispense Refill  . acetaminophen (TYLENOL) 500 MG tablet Take 500-1,000 mg by mouth every 6 (six) hours as needed for pain.       Marland Kitchen amLODipine (NORVASC) 5 MG tablet Take 1 tablet (5 mg total) by mouth daily.  30 tablet  0  . vitamin C (ASCORBIC ACID) 500 MG tablet Take 1,000 mg by mouth daily.      . iron polysaccharides (NIFEREX) 150 MG capsule Take 1 capsule (150 mg total) by mouth 2 (two) times daily at 10 AM and 5 PM.  60 capsule  0  . mesalamine (LIALDA) 1.2 G EC tablet Take 4 tablets (4.8 g total) by mouth daily with breakfast.  30 tablet  0  .  predniSONE (DELTASONE) 20 MG tablet Take 2 tablets (40 mg total) by mouth daily before breakfast.  30 tablet  0  . oxyCODONE (OXY IR/ROXICODONE) 5 MG immediate release tablet Take 1 tablet (5 mg total) by mouth every 4 (four) hours as needed for moderate pain or severe pain.  45 tablet  0   No facility-administered medications prior to visit.   Allergies  Allergen Reactions  . Humira [Adalimumab] Anaphylaxis  . Remicade [Infliximab] Anaphylaxis and Swelling     Patient Active Problem List   Diagnosis Date Noted  . Abdominal pain  08/27/2013  . Joint pain 08/27/2013  . Anemia 08/27/2013  . Bloody stools 08/30/2012  . Crohn's colitis 08/29/2012  . Chronic pain syndrome 08/29/2012  . Chronic steroid use 08/29/2012   History  Substance Use Topics  . Smoking status: Never Smoker   . Smokeless tobacco: Never Used  . Alcohol Use: Yes     Comment: rarely   family history includes Breast cancer in his maternal grandmother; Diabetes in an other family member; Heart disease in an other family member; Prostate cancer in his paternal grandfather. There is no history of Colon cancer.     Objective:   Physical Exam  well-developed young white male in no acute distress, blood pressure one ten over four pulse 100 height 6 foot 2 weight 180. HEENT; nontraumatic normocephalic EOMI PERRLA sclera anicteric, Supple; no JVD, Cardiovascular; regular rate and rhythm with S1-S2 no murmur or gallop, Pulmonary; clear bilaterally, Abdomen ;soft minimally tender in the right lower quadrant is no guarding or rebound no palpable mass or hepatomegaly bowel sounds are active, Rectal; exam not done, Ext; no clubbing cyanosis or edema skin warm and dry, Psych; mood and affect appropriate        Assessment & Plan:  #[id number]  28 -year-old male with Crohn's ileocolitis initial diagnosis 2006 with recent exacerbation requiring hospitalization about 2 weeks ago. He is significantly improved on steroids and reinitiation of methotrexate. IBD related arthropathy improved #3 iron deficiency anemia #4 history of narcotic addiction  Plan; Continue prednisone but decreased to 30 mg by mouth daily x2 weeks then 20 mg by mouth daily x2 weeks then 10 mg by mouth daily until seen back in the office Prescription for methotrexate 25 mg subcutaneous weekly Continue Lialda 4.8 g daily Patient will followup with Dr. Marina GoodellPerry in about 4 weeks

## 2013-09-17 NOTE — Patient Instructions (Signed)
We sent prescriptions to [facility], [address] 1. Lialda 2 tab twice daily 2. Niferex Take 1 tab daily. 3. Methotrexate  do 1 injection sub q once weekly. 4. Prednisone 10 mg- Take 3 tab for 14 days, then 2 tabs for 14 days, then 1 tab daily until you get further instrucitons

## 2013-10-29 ENCOUNTER — Ambulatory Visit: Payer: 59 | Admitting: Internal Medicine

## 2013-12-27 ENCOUNTER — Telehealth: Payer: Self-pay | Admitting: *Deleted

## 2013-12-27 NOTE — Telephone Encounter (Signed)
Per [name] PA-C, We faxed to mail order pharamcy, [facility], Lialda 1.2 g, # 130 with 11 refills. [email or web address] and sent [facility]  to be scanned

## 2014-04-11 ENCOUNTER — Inpatient Hospital Stay (HOSPITAL_COMMUNITY)
Admission: EM | Admit: 2014-04-11 | Discharge: 2014-04-17 | DRG: 385 | Disposition: A | Discharge status: Home / Self Care | Payer: 59 | Attending: Internal Medicine | Admitting: Internal Medicine

## 2014-04-11 ENCOUNTER — Encounter (HOSPITAL_COMMUNITY): Payer: Self-pay | Admitting: Emergency Medicine

## 2014-04-11 DIAGNOSIS — K625 Hemorrhage of anus and rectum: Secondary | ICD-10-CM | POA: Diagnosis present

## 2014-04-11 DIAGNOSIS — E43 Unspecified severe protein-calorie malnutrition: Secondary | ICD-10-CM | POA: Diagnosis present

## 2014-04-11 DIAGNOSIS — E440 Moderate protein-calorie malnutrition: Secondary | ICD-10-CM | POA: Insufficient documentation

## 2014-04-11 DIAGNOSIS — K50819 Crohn's disease of both small and large intestine with unspecified complications: Secondary | ICD-10-CM | POA: Diagnosis present

## 2014-04-11 DIAGNOSIS — F1290 Cannabis use, unspecified, uncomplicated: Secondary | ICD-10-CM | POA: Diagnosis present

## 2014-04-11 DIAGNOSIS — K50911 Crohn's disease, unspecified, with rectal bleeding: Secondary | ICD-10-CM | POA: Diagnosis present

## 2014-04-11 DIAGNOSIS — Z888 Allergy status to other drugs, medicaments and biological substances status: Secondary | ICD-10-CM

## 2014-04-11 DIAGNOSIS — K50919 Crohn's disease, unspecified, with unspecified complications: Secondary | ICD-10-CM

## 2014-04-11 DIAGNOSIS — M1990 Unspecified osteoarthritis, unspecified site: Secondary | ICD-10-CM | POA: Diagnosis present

## 2014-04-11 DIAGNOSIS — R109 Unspecified abdominal pain: Secondary | ICD-10-CM | POA: Diagnosis present

## 2014-04-11 DIAGNOSIS — Z7952 Long term (current) use of systemic steroids: Secondary | ICD-10-CM | POA: Diagnosis not present

## 2014-04-11 DIAGNOSIS — D72829 Elevated white blood cell count, unspecified: Secondary | ICD-10-CM | POA: Diagnosis present

## 2014-04-11 DIAGNOSIS — D638 Anemia in other chronic diseases classified elsewhere: Secondary | ICD-10-CM | POA: Diagnosis present

## 2014-04-11 DIAGNOSIS — Z6821 Body mass index (BMI) 21.0-21.9, adult: Secondary | ICD-10-CM | POA: Diagnosis not present

## 2014-04-11 DIAGNOSIS — T380X5A Adverse effect of glucocorticoids and synthetic analogues, initial encounter: Secondary | ICD-10-CM | POA: Diagnosis present

## 2014-04-11 DIAGNOSIS — E876 Hypokalemia: Secondary | ICD-10-CM | POA: Diagnosis present

## 2014-04-11 DIAGNOSIS — K5010 Crohn's disease of large intestine without complications: Secondary | ICD-10-CM

## 2014-04-11 DIAGNOSIS — K5090 Crohn's disease, unspecified, without complications: Secondary | ICD-10-CM | POA: Diagnosis present

## 2014-04-11 DIAGNOSIS — K922 Gastrointestinal hemorrhage, unspecified: Secondary | ICD-10-CM | POA: Diagnosis present

## 2014-04-11 LAB — COMPREHENSIVE METABOLIC PANEL
ALBUMIN: 4.6 g/dL (ref 3.5–5.2)
ALBUMIN: 3.7 g/dL (ref 3.5–5.2)
ALK PHOS: 46 U/L (ref 39–117)
ALT: 14 U/L (ref 0–53)
ALT: 11 U/L (ref 0–53)
AST: 19 U/L (ref 0–37)
AST: 13 U/L (ref 0–37)
Alkaline Phosphatase: 37 U/L — ABNORMAL LOW (ref 39–117)
Anion gap: 14 (ref 5–15)
Anion gap: 12 (ref 5–15)
BILIRUBIN TOTAL: 0.7 mg/dL (ref 0.3–1.2)
BUN: 12 mg/dL (ref 6–23)
BUN: 9 mg/dL (ref 6–23)
CHLORIDE: 105 mmol/L (ref 96–112)
CO2: 24 mmol/L (ref 19–32)
CO2: 21 mmol/L (ref 19–32)
CREATININE: 0.96 mg/dL (ref 0.50–1.35)
CREATININE: 0.76 mg/dL (ref 0.50–1.35)
Calcium: 9.9 mg/dL (ref 8.4–10.5)
Calcium: 8.6 mg/dL (ref 8.4–10.5)
Chloride: 100 mmol/L (ref 96–112)
GFR calc Af Amer: >90 mL/min (ref >90)
GFR calc Af Amer: >90 mL/min (ref >90)
GFR calc non Af Amer: >90 mL/min (ref >90)
GFR calc non Af Amer: >90 mL/min (ref >90)
GLUCOSE: 82 mg/dL (ref 70–99)
Glucose, Bld: 111 mg/dL — ABNORMAL HIGH (ref 70–99)
POTASSIUM: 3.4 mmol/L — AB (ref 3.5–5.1)
Potassium: 3.6 mmol/L (ref 3.5–5.1)
Sodium: 138 mmol/L (ref 135–145)
Sodium: 138 mmol/L (ref 135–145)
TOTAL PROTEIN: 7.4 g/dL (ref 6.0–8.3)
Total Bilirubin: 0.9 mg/dL (ref 0.3–1.2)
Total Protein: 9.1 g/dL — ABNORMAL HIGH (ref 6.0–8.3)

## 2014-04-11 LAB — CBC WITH DIFFERENTIAL/PLATELET
BASOS ABS: 0 10*3/uL (ref 0.0–0.1)
Basophils Absolute: 0 10*3/uL (ref 0.0–0.1)
Basophils Relative: 0 % (ref 0–1)
Basophils Relative: 0 % (ref 0–1)
EOS PCT: 1 % (ref 0–5)
EOS PCT: 1 % (ref 0–5)
Eosinophils Absolute: 0.2 10*3/uL (ref 0.0–0.7)
Eosinophils Absolute: 0.1 10*3/uL (ref 0.0–0.7)
HCT: 37.8 % — ABNORMAL LOW (ref 39.0–52.0)
HCT: 32.5 % — ABNORMAL LOW (ref 39.0–52.0)
Hemoglobin: 11.9 g/dL — ABNORMAL LOW (ref 13.0–17.0)
Hemoglobin: 10.1 g/dL — ABNORMAL LOW (ref 13.0–17.0)
LYMPHS ABS: 1.4 10*3/uL (ref 0.7–4.0)
Lymphocytes Relative: 9 % — ABNORMAL LOW (ref 12–46)
Lymphocytes Relative: 9 % — ABNORMAL LOW (ref 12–46)
Lymphs Abs: 1.2 10*3/uL (ref 0.7–4.0)
MCH: 23.2 pg — ABNORMAL LOW (ref 26.0–34.0)
MCH: 22.8 pg — AB (ref 26.0–34.0)
MCHC: 31.5 g/dL (ref 30.0–36.0)
MCHC: 31.1 g/dL (ref 30.0–36.0)
MCV: 73.5 fL — AB (ref 78.0–100.0)
MCV: 73.4 fL — ABNORMAL LOW (ref 78.0–100.0)
MONO ABS: 0.8 10*3/uL (ref 0.1–1.0)
MONO ABS: 0.8 10*3/uL (ref 0.1–1.0)
Monocytes Relative: 5 % (ref 3–12)
Monocytes Relative: 6 % (ref 3–12)
NEUTROS ABS: 11.2 10*3/uL — AB (ref 1.7–7.7)
NEUTROS PCT: 85 % — AB (ref 43–77)
NEUTROS PCT: 84 % — AB (ref 43–77)
Neutro Abs: 13.2 10*3/uL — ABNORMAL HIGH (ref 1.7–7.7)
PLATELETS: 473 10*3/uL — AB (ref 150–400)
Platelets: 424 10*3/uL — ABNORMAL HIGH (ref 150–400)
RBC: 5.14 MIL/uL (ref 4.22–5.81)
RBC: 4.43 MIL/uL (ref 4.22–5.81)
RDW: 14.8 % (ref 11.5–15.5)
RDW: 14.7 % (ref 11.5–15.5)
WBC: 15.6 10*3/uL — AB (ref 4.0–10.5)
WBC: 13.3 10*3/uL — AB (ref 4.0–10.5)

## 2014-04-11 LAB — URINALYSIS, ROUTINE W REFLEX MICROSCOPIC
Bilirubin Urine: NEGATIVE
Glucose, UA: NEGATIVE mg/dL
KETONES UR: 40 mg/dL — AB
Leukocytes, UA: NEGATIVE
NITRITE: NEGATIVE
PH: 6.5 (ref 5.0–8.0)
PROTEIN: NEGATIVE mg/dL
Specific Gravity, Urine: 1.013 (ref 1.005–1.030)
Urobilinogen, UA: 0.2 mg/dL (ref 0.0–1.0)

## 2014-04-11 LAB — TYPE AND SCREEN
ABO/RH(D): B POS
Antibody Screen: NEGATIVE

## 2014-04-11 LAB — I-STAT CG4 LACTIC ACID, ED: Lactic Acid, Venous: 2.18 mmol/L (ref 0.5–2.0)

## 2014-04-11 LAB — URINE MICROSCOPIC-ADD ON

## 2014-04-11 LAB — PHOSPHORUS: PHOSPHORUS: 3.2 mg/dL (ref 2.3–4.6)

## 2014-04-11 LAB — APTT: aPTT: 38 seconds — ABNORMAL HIGH (ref 24–37)

## 2014-04-11 LAB — PROTIME-INR
INR: 1.23 (ref 0.00–1.49)
Prothrombin Time: 15.6 seconds — ABNORMAL HIGH (ref 11.6–15.2)

## 2014-04-11 LAB — LIPASE, BLOOD: Lipase: 23 U/L (ref 11–59)

## 2014-04-11 LAB — MAGNESIUM: MAGNESIUM: 1.6 mg/dL (ref 1.5–2.5)

## 2014-04-11 LAB — POC OCCULT BLOOD, ED: Fecal Occult Bld: POSITIVE — AB

## 2014-04-11 MED ORDER — SODIUM CHLORIDE 0.9 % IV SOLN
INTRAVENOUS | Status: DC
  Administered 2014-04-11 – 2014-04-12 (×3): via INTRAVENOUS

## 2014-04-11 MED ORDER — SODIUM CHLORIDE 0.9 % IJ SOLN
3.0000 mL | Freq: Two times a day (BID) | INTRAMUSCULAR | Status: DC
Start: 2014-04-11 — End: 2014-04-17
  Administered 2014-04-13 – 2014-04-17 (×4): 3 mL via INTRAVENOUS

## 2014-04-11 MED ORDER — MORPHINE SULFATE 4 MG/ML IJ SOLN
4.0000 mg | Freq: Once | INTRAMUSCULAR | Status: AC
  Administered 2014-04-11: 4 mg via INTRAVENOUS
  Filled 2014-04-11: qty 1

## 2014-04-11 MED ORDER — ONDANSETRON HCL 4 MG PO TABS: 4.0000 mg | ORAL_TABLET | Freq: Four times a day (QID) | ORAL | Status: DC | PRN

## 2014-04-11 MED ORDER — ACETAMINOPHEN 325 MG PO TABS: 650.0000 mg | ORAL_TABLET | Freq: Four times a day (QID) | ORAL | Status: DC | PRN

## 2014-04-11 MED ORDER — BOOST / RESOURCE BREEZE PO LIQD
1.0000 | Freq: Three times a day (TID) | ORAL | Status: DC
  Administered 2014-04-12 – 2014-04-17 (×7): 1 via ORAL

## 2014-04-11 MED ORDER — MORPHINE SULFATE 2 MG/ML IJ SOLN: 1.0000 mg | INTRAMUSCULAR | Status: DC | PRN

## 2014-04-11 MED ORDER — SODIUM CHLORIDE 0.9 % IV BOLUS (SEPSIS)
2000.0000 mL | Freq: Once | INTRAVENOUS | Status: AC
  Administered 2014-04-11: 2000 mL via INTRAVENOUS

## 2014-04-11 MED ORDER — ACETAMINOPHEN 650 MG RE SUPP: 650.0000 mg | Freq: Four times a day (QID) | RECTAL | Status: DC | PRN

## 2014-04-11 MED ORDER — PANTOPRAZOLE SODIUM 40 MG IV SOLR
40.0000 mg | Freq: Two times a day (BID) | INTRAVENOUS | Status: DC
  Administered 2014-04-11 – 2014-04-16 (×10): 40 mg via INTRAVENOUS
  Filled 2014-04-11 (×10): qty 40

## 2014-04-11 MED ORDER — SODIUM CHLORIDE 0.9 % IV SOLN
INTRAVENOUS | Status: DC
  Administered 2014-04-11 – 2014-04-16 (×5): via INTRAVENOUS

## 2014-04-11 MED ORDER — HYDROMORPHONE HCL 1 MG/ML IJ SOLN
1.0000 mg | INTRAMUSCULAR | Status: DC | PRN
  Administered 2014-04-11 – 2014-04-16 (×45): 1 mg via INTRAVENOUS
  Filled 2014-04-11 (×46): qty 1

## 2014-04-11 MED ORDER — METHYLPREDNISOLONE SODIUM SUCC 40 MG IJ SOLR
40.0000 mg | INTRAMUSCULAR | Status: DC
  Administered 2014-04-12 – 2014-04-15 (×4): 40 mg via INTRAVENOUS
  Filled 2014-04-11 (×5): qty 1

## 2014-04-11 MED ORDER — SODIUM CHLORIDE 0.9 % IV BOLUS (SEPSIS): 1000.0000 mL | Freq: Once | INTRAVENOUS | Status: DC

## 2014-04-11 MED ORDER — ONDANSETRON HCL 4 MG/2ML IJ SOLN
4.0000 mg | Freq: Once | INTRAMUSCULAR | Status: AC
  Administered 2014-04-11: 4 mg via INTRAVENOUS
  Filled 2014-04-11: qty 2

## 2014-04-11 MED ORDER — METHYLPREDNISOLONE SODIUM SUCC 125 MG IJ SOLR
125.0000 mg | Freq: Once | INTRAMUSCULAR | Status: AC
  Administered 2014-04-11: 125 mg via INTRAVENOUS
  Filled 2014-04-11: qty 2

## 2014-04-11 MED ORDER — POTASSIUM CHLORIDE CRYS ER 20 MEQ PO TBCR
40.0000 meq | EXTENDED_RELEASE_TABLET | Freq: Once | ORAL | Status: AC
  Administered 2014-04-11: 40 meq via ORAL
  Filled 2014-04-11: qty 2

## 2014-04-11 MED ORDER — ONDANSETRON HCL 4 MG/2ML IJ SOLN
4.0000 mg | Freq: Four times a day (QID) | INTRAMUSCULAR | Status: DC | PRN
  Administered 2014-04-13 – 2014-04-15 (×3): 4 mg via INTRAVENOUS
  Filled 2014-04-11 (×3): qty 2

## 2014-04-11 NOTE — Progress Notes (Signed)
  CARE MANAGEMENT ED NOTE 04/11/2014  Patient:  BLAIKE, PADUANO   Account Number:  [id number]  Date Initiated:  04/11/2014  Documentation initiated by:  Radford Pax  Subjective/Objective Assessment:   Patient presents to Ed with abdominal pain and bloody stools for three months.     Subjective/Objective Assessment Detail:   Patient with pmhx of Chron's disease.     Action/Plan:   Action/Plan Detail:   Anticipated DC Date:       Status Recommendation to Physician:   Result of Recommendation:    Other ED Services  Consult Working Plan    DC Planning Services  CM consult  Medication Assistance    Choice offered to / List presented to:            Status of service:  Completed, signed off  ED Comments:   ED Comments Detail:  EDCM spoke to patient at bedside.  Patient reports he has been fired today from his job.  Patient is unsure whether he has insurnace coverage or not.  Patient reports he still may have insurnace for thirty more days.  Patient reports he has been on prednisione, methotrexate and a drug called Lialda for his chron's disease.  Patient reports his insurance covered Lialda. Patient reports a doctor had weaned him off prednisone in October 2015.  Patient reports the methotrexate made him nauseous.  He has not taken his medications for his Chron's disease since Sept or October. Patient reports he is followed by Dr. Marina Goodell or Dr. Rhea Belton at [facility] for GI.  Patient reports his pcp is in [place] off [address].  Patient reports he has been seeing this pcp for six years.  Patient unsure of his pcps name. Patient is MATCH eligible.  No further EDCM needs at [facility] stime.

## 2014-04-11 NOTE — ED Notes (Signed)
MD at bedside. DEVINE MD PRESENT TO EVALUATE THIS PT

## 2014-04-11 NOTE — ED Notes (Addendum)
Pt c/o intermittent chest pain x month. Pt states that he has crohn's disease and been battling for 11 year. Pt states that he has had bright red to orange red blood when he wipes x month.  Pt states that he has n/v, and dizziness intermittently. Pt also states that he pain is worse when he presses down on his chest, but seems like it would feel better "if it would pop".

## 2014-04-11 NOTE — ED Notes (Signed)
Istat CG4 was notified to Dr. Rubin Payor that it was High.... 2.18

## 2014-04-11 NOTE — H&P (Signed)
[facility] History and Physical  Troy Ramirez ZOX:[id number] DOB: 08-Sep-1985 DOA: 04/11/2014  Referring physician: ER physician PCP: No PCP Per Patient   Chief Complaint: abdominal pain, rectal bleed  HPI:  29 year old male with history of Crohn's disease, has had hospitalization in 08/2013 for exacerbation of Crohn's, apparently got laid off from his job and under lot of stress. Reported nausea and vomiting for last few days with some blood in stool with bowel movement. No fevers or chills. He has some abdominal discomfort in mid abdomen, cram like and relieved with rest. No specific aggravating factor. No diarrhea or constipation. No chest pain, no shortness of breath or palpitations. No urinary complaints.   In ED, pt hemodynamically stable. Blood work notable for leukocytosis of 5.8. Abdominal pain, nausea and vomiting persisted in ED even with solumedrol 125 mg IV once, analgesia and antiemetics so he was admitted for further management of crohn's disease.    Assessment & Plan    Principal Problem:   Abdominal pain / Crohn's disease exacerbation  - pt presented with abdominal pain which possibly is from Crohn's flare up - given solumedrol 125 mg IV in ED. Will continue with 40 mg IV daily. - will provide supportive care with IV fluids, analgesia as needed for pain control - clear liquid diet if able to tolerate - antiemetics as needed  - if he does not feel better may need GI consult   Active Problems:   Lower GI bleed / Anemia of chronic disease - possible from Crohn's disease - hemoglobin is 11.9 - no current indications for transfusion - started protonix 40 mg IV Q 12 hours    Leukocytosis - secondary to chronic steroid use - no evidence of infection       Hypokalemia - due to GI losses - supplemented - follow up BMP in am   DVT prophylaxis:  - SCD's bilaterally due to risk of bleeding   Radiological Exams on Admission: No results found.  EKG: sinus  tachycardia   Code Status: Full Family Communication: Plan of care discussed with the patient  Disposition Plan: Admit for further evaluation  Manson Passey, MD  [facility] Pager [phone]  Review of Systems:  Constitutional: Negative for fever, chills and malaise/fatigue. Negative for diaphoresis.  HENT: Negative for hearing loss, ear pain, nosebleeds, congestion, sore throat, neck pain, tinnitus and ear discharge.   Eyes: Negative for blurred vision, double vision, photophobia, pain, discharge and redness.  Respiratory: Negative for cough, hemoptysis, sputum production, shortness of breath, wheezing and stridor.   Cardiovascular: Negative for chest pain, palpitations, orthopnea, claudication and leg swelling.  Gastrointestinal: per HPI.  Genitourinary: Negative for dysuria, urgency, frequency, hematuria and flank pain.  Musculoskeletal: Negative for myalgias, back pain, joint pain and falls.  Skin: Negative for itching and rash.  Neurological: Negative for dizziness and weakness. Negative for tingling, tremors, sensory change, speech change, focal weakness, loss of consciousness and headaches.  Endo/Heme/Allergies: Negative for environmental allergies and polydipsia. Does not bruise/bleed easily.  Psychiatric/Behavioral: Negative for suicidal ideas. The patient is not nervous/anxious.      Past Medical History  Diagnosis Date  . Crohn disease   . Anemia     required transfusion of bood in 2008 or 2009  . 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   . Internal hemorrhoids    Past Surgical History  Procedure Laterality Date  . Neg hx  Social History:  reports that he has never smoked. He has never used smokeless tobacco. He reports that he drinks alcohol. He reports that he uses illicit drugs (Marijuana).  Allergies  Allergen Reactions  . Humira [Adalimumab] Anaphylaxis  . Remicade [Infliximab] Anaphylaxis and Swelling     Family History:  Family History  Problem Relation Age of Onset  . Diabetes      Both sides  . Heart disease      both sides  . Breast cancer Maternal Grandmother   . Prostate cancer Paternal Grandfather   . Colon cancer Neg Hx      Prior to Admission medications   Medication Sig Start Date End Date Taking? Authorizing Provider  acetaminophen (TYLENOL) 500 MG tablet Take 500-1,000 mg by mouth every 6 (six) hours as needed for pain.    Yes Historical Provider, MD  amLODipine (NORVASC) 5 MG tablet Take 1 tablet (5 mg total) by mouth daily. Patient not taking: Reported on 04/11/2014 09/03/13   Alison Murray, MD  iron polysaccharides (NIFEREX) 150 MG capsule Take 1 capsule daily. Patient not taking: Reported on 04/11/2014 09/17/13   Amy S Esterwood, PA-C  mesalamine (LIALDA) 1.2 G EC tablet Take 2 tab twice daily Patient not taking: Reported on 04/11/2014 09/17/13   Amy S Esterwood, PA-C  Methotrexate, PF, 25 MG/0.4ML SOAJ Inject 1 vial into the skin once a week. Patient not taking: Reported on 04/11/2014 09/17/13   Amy S Esterwood, PA-C  predniSONE (DELTASONE) 20 MG tablet Prednisone 10 mg,  Take 30 mg daily for 14 days, take 20 mg daily for 14 days, take 10 mg daily until further instructed. Patient not taking: Reported on 04/11/2014 09/17/13   Sammuel Cooper, PA-C   Physical Exam: Filed Vitals:   04/11/14 1536 04/11/14 1559 04/11/14 1619  BP: 140/99  150/87  Pulse: 141  83  Temp: 98 F (36.7 C)    TempSrc: Oral    Resp: 19  26  Height:   (1.905 m)   Weight:  81.647 kg (180 lb)   SpO2: 100%  100%    Physical Exam  Constitutional: Appears well-developed and well-nourished. No distress.  HENT: Normocephalic. No tonsillar erythema or exudates Eyes: Conjunctivae and EOM are normal. PERRLA, no scleral icterus.  Neck: Normal ROM. Neck supple. No JVD. No tracheal deviation. No thyromegaly.  CVS: rate controlled, S1/S2 +, no murmurs, no gallops, no carotid bruit.  Pulmonary: Effort  and breath sounds normal, no stridor, rhonchi, wheezes, rales.  Abdominal: Soft. BS +,  no distension, tenderness in mid abdomen to deep palpation, no guarding or rebound tenderness  Musculoskeletal: Normal range of motion. No edema and no tenderness.  Lymphadenopathy: No lymphadenopathy noted, cervical, inguinal. Neuro: Alert. Normal reflexes, muscle tone coordination. No focal neurologic deficits. Skin: Skin is warm and dry. No rash noted.  No erythema. No pallor.  Psychiatric: Normal mood and affect. Behavior, judgment, thought content normal.   Labs on Admission:  Basic Metabolic Panel:  Recent Labs Lab 04/11/14 1559  NA 138  K 3.4*  CL 100  CO2 24  GLUCOSE 111*  BUN 12  CREATININE 0.96  CALCIUM 9.9   Liver Function Tests:  Recent Labs Lab 04/11/14 1559  AST 19  ALT 14  ALKPHOS 46  BILITOT 0.7  PROT 9.1*  ALBUMIN 4.6    Recent Labs Lab 04/11/14 1559  LIPASE 23   No results for input(s): AMMONIA in the last 168 hours. CBC:  Recent Labs Lab  04/11/14 1559  WBC 15.6*  NEUTROABS PENDING  HGB 11.9*  HCT 37.8*  MCV 73.5*  PLT 473*   Cardiac Enzymes: No results for input(s): CKTOTAL, CKMB, CKMBINDEX, TROPONINI in the last 168 hours. BNP: Invalid input(s): POCBNP CBG: No results for input(s): GLUCAP in the last 168 hours.  If 7PM-7AM, please contact night-coverage [email or web address] Password [facility] 04/11/2014, 5:26 PM

## 2014-04-11 NOTE — ED Provider Notes (Signed)
CSN: [id number]     Arrival date & time 04/11/14  1523 History   First MD Initiated Contact with Patient 04/11/14 1545     Chief Complaint  Patient presents with  . Abdominal Pain  . Night Sweats  . Rectal Bleeding  . Chest Pain     (Consider location/radiation/quality/duration/timing/severity/associated sxs/prior Treatment) HPI Comments: Patient here with abdominal pain and bloody stools 3 weeks. History of Crohn's disease and has been out of his immunotherapy due to insurance issues. No fever or chills. Nausea no vomiting. Notes weight loss. Does become dizzy with standing. History of similar symptoms which required blood transfusion. Denies any prior history of abdominal surgeries. Symptoms persistent and nothing makes them better or worse. No treatment use prior to arrival.  Patient is a 29 y.o. male presenting with abdominal pain, hematochezia, and chest pain. The history is provided by the patient.  Abdominal Pain Associated symptoms: chest pain and hematochezia   Rectal Bleeding Associated symptoms: abdominal pain   Chest Pain Associated symptoms: abdominal pain     Past Medical History  Diagnosis Date  . Crohn disease   . Anemia     required transfusion of bood in 2008 or 2009  . 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   . Internal hemorrhoids    Past Surgical History  Procedure Laterality Date  . Neg hx     Family History  Problem Relation Age of Onset  . Diabetes      Both sides  . Heart disease      both sides  . Breast cancer Maternal Grandmother   . Prostate cancer Paternal Grandfather   . Colon cancer Neg Hx    History  Substance Use Topics  . Smoking status: Never Smoker   . Smokeless tobacco: Never Used  . Alcohol Use: Yes     Comment: rarely    Review of Systems  Cardiovascular: Positive for chest pain.  Gastrointestinal: Positive for abdominal pain and hematochezia.  All other systems reviewed and  are negative.     Allergies  Humira and Remicade  Home Medications   Prior to Admission medications   Medication Sig Start Date End Date Taking? Authorizing Provider  acetaminophen (TYLENOL) 500 MG tablet Take 500-1,000 mg by mouth every 6 (six) hours as needed for pain.     Historical Provider, MD  amLODipine (NORVASC) 5 MG tablet Take 1 tablet (5 mg total) by mouth daily. 09/03/13   Alison Murray, MD  iron polysaccharides (NIFEREX) 150 MG capsule Take 1 capsule daily. 09/17/13   Amy S Esterwood, PA-C  mesalamine (LIALDA) 1.2 G EC tablet Take 2 tab twice daily 09/17/13   Amy S Esterwood, PA-C  Methotrexate, PF, 25 MG/0.4ML SOAJ Inject 1 vial into the skin once a week. 09/17/13   Amy S Esterwood, PA-C  predniSONE (DELTASONE) 20 MG tablet Prednisone 10 mg,  Take 30 mg daily for 14 days, take 20 mg daily for 14 days, take 10 mg daily until further instructed. 09/17/13   Amy S Esterwood, PA-C  vitamin C (ASCORBIC ACID) 500 MG tablet Take 1,000 mg by mouth daily.    Historical Provider, MD   BP 140/99 mmHg  Pulse 141  Temp(Src) 98 F (36.7 C) (Oral)  Resp 19  SpO2 100% Physical Exam  Constitutional: He is oriented to person, place, and time. He appears well-developed and well-nourished.  Non-toxic appearance. No distress.  HENT:  Head:  Normocephalic and atraumatic.  Eyes: Conjunctivae, EOM and lids are normal. Pupils are equal, round, and reactive to light.  Neck: Normal range of motion. Neck supple. No tracheal deviation present. No thyroid mass present.  Cardiovascular: Regular rhythm and normal heart sounds.  Tachycardia present.  Exam reveals no gallop.   No murmur heard. Pulmonary/Chest: Effort normal and breath sounds normal. No stridor. No respiratory distress. He has no decreased breath sounds. He has no wheezes. He has no rhonchi. He has no rales.  Abdominal: Soft. Normal appearance and bowel sounds are normal. He exhibits no distension. There is no tenderness. There is no rebound  and no CVA tenderness.  Musculoskeletal: Normal range of motion. He exhibits no edema or tenderness.  Neurological: He is alert and oriented to person, place, and time. He has normal strength. No cranial nerve deficit or sensory deficit. GCS eye subscore is 4. GCS verbal subscore is 5. GCS motor subscore is 6.  Skin: Skin is warm and dry. No abrasion and no rash noted.  Psychiatric: He has a normal mood and affect. His speech is normal and behavior is normal.  Nursing note and vitals reviewed.   ED Course  Procedures (including critical care time) Labs Review Labs Reviewed  CBC WITH DIFFERENTIAL/PLATELET  COMPREHENSIVE METABOLIC PANEL  LIPASE, BLOOD  URINALYSIS, ROUTINE W REFLEX MICROSCOPIC  I-STAT CG4 LACTIC ACID, ED  TYPE AND SCREEN    Imaging Review No results found.   EKG Interpretation   Date/Time:  Thursday April 11 2014 15:39:19 EST Ventricular Rate:  152 PR Interval:  58 QRS Duration: 90 QT Interval:  289 QTC Calculation: 459 R Axis:   96 Text Interpretation:  Sinus tachycardia Paired ventricular premature  complexes Borderline right axis deviation Borderline repolarization  abnormality faster when compared to prior Confirmed by Rufina Kimery  MD, Yousef Huge  ([id number]) on 04/11/2014 3:45:38 PM      MDM   Final diagnoses:  None     Patient given IV fluids and Solu-Medrol and pain medication. Will be admitted for Crohn's exacerbation.   Toy Baker, MD 04/11/14 [id number]

## 2014-04-12 LAB — COMPREHENSIVE METABOLIC PANEL
ALBUMIN: 3.9 g/dL (ref 3.5–5.2)
ALK PHOS: 40 U/L (ref 39–117)
ALT: 11 U/L (ref 0–53)
AST: 14 U/L (ref 0–37)
Anion gap: 11 (ref 5–15)
BUN: 8 mg/dL (ref 6–23)
CHLORIDE: 103 mmol/L (ref 96–112)
CO2: 23 mmol/L (ref 19–32)
Calcium: 8.9 mg/dL (ref 8.4–10.5)
Creatinine, Ser: 0.69 mg/dL (ref 0.50–1.35)
GFR calc non Af Amer: >90 mL/min (ref >90)
Glucose, Bld: 149 mg/dL — ABNORMAL HIGH (ref 70–99)
Potassium: 4.4 mmol/L (ref 3.5–5.1)
Sodium: 137 mmol/L (ref 135–145)
TOTAL PROTEIN: 7.9 g/dL (ref 6.0–8.3)
Total Bilirubin: 0.5 mg/dL (ref 0.3–1.2)

## 2014-04-12 LAB — CBC
HCT: 35.2 % — ABNORMAL LOW (ref 39.0–52.0)
Hemoglobin: 10.8 g/dL — ABNORMAL LOW (ref 13.0–17.0)
MCH: 22.6 pg — AB (ref 26.0–34.0)
MCHC: 30.7 g/dL (ref 30.0–36.0)
MCV: 73.6 fL — ABNORMAL LOW (ref 78.0–100.0)
PLATELETS: 472 10*3/uL — AB (ref 150–400)
RBC: 4.78 MIL/uL (ref 4.22–5.81)
RDW: 14.8 % (ref 11.5–15.5)
WBC: 10.8 10*3/uL — ABNORMAL HIGH (ref 4.0–10.5)

## 2014-04-12 LAB — GLUCOSE, CAPILLARY: Glucose-Capillary: 123 mg/dL — ABNORMAL HIGH (ref 70–99)

## 2014-04-12 MED ORDER — LORAZEPAM 2 MG/ML IJ SOLN
1.0000 mg | Freq: Four times a day (QID) | INTRAMUSCULAR | Status: DC | PRN
  Administered 2014-04-12 – 2014-04-16 (×15): 1 mg via INTRAVENOUS
  Filled 2014-04-12 (×15): qty 1

## 2014-04-12 NOTE — Progress Notes (Signed)
Patient ID: Troy Ramirez, male   DOB: November 09, 1985, 29 y.o.   MRN: [id number]  [facility] PROGRESS NOTE  Winston Misner WUJ:[id number] DOB: [birth year] DOA: 04/11/2014 PCP: No PCP Per Patient   Brief narrative:    29 year old male with history of Crohn's disease, has had hospitalization in 08/2013 for exacerbation of Crohn's, apparently got laid off from his job and under lot of stress. Reported nausea and vomiting for last few days with some blood in stool. No fevers or chills. He has some abdominal discomfort in mid abdomen, cramp like and relieved with rest. No specific aggravating factor. No diarrhea or constipation. No chest pain, no shortness of breath or palpitations. No urinary complaints.   In ED, pt hemodynamically stable. Blood work notable for leukocytosis of 15.6. Abdominal pain, nausea and vomiting persisted in ED even with solumedrol 125 mg IV once, analgesia and antiemetics given, admitted for further management of crohn's disease.   Assessment/Plan:    Principal Problem:  Abdominal pain / Crohn's disease exacerbation  - possibly is from Crohn's flare up - given solumedrol 125 mg IV in ED. Will continue with 40 mg IV daily. - continue to provide supportive care with IV fluids, analgesia as needed for pain control - clear liquid diet if able to tolerate and advance when he is ready  - antiemetics as needed   Active Problems:  Lower GI bleed / Anemia of chronic disease - possible from Crohn's disease - hemoglobin 11.9 on admission but now down to 10.8 which could be dilutional as well - no current indications for transfusion - started protonix 40 mg IV Q 12 hours   Leukocytosis - secondary to chronic steroid use - no evidence of infection  - WBC is trending down     Hypokalemia - due to GI losses - supplemented and WNL this AM - follow up BMP in am  Code Status: Full.  Family Communication:  plan of care discussed with the patient Disposition Plan: Home  when stable.   IV access:  Peripheral IV  Procedures and diagnostic studies:    No results found.  Medical Consultants:  None   Other Consultants:  None  IAnti-Infectives:   None  Debbora Presto, MD  [facility] Pager [phone]  If 7PM-7AM, please contact night-coverage [email or web address] Password [facility] 04/12/2014, 3:44 PM   LOS: 1 day   HPI/Subjective: No events overnight.   Objective: Filed Vitals:   04/11/14 1846 04/11/14 1903 04/12/14 0433 04/12/14 1450  BP: 152/51 154/92 141/86 147/93  Pulse: 68 78 80 65  Temp: 98.6 F (37 C) 98.6 F (37 C) 98.2 F (36.8 C) 98.1 F (36.7 C)  TempSrc: Oral Oral Oral Oral  Resp: Height:      Weight:   81.6 kg (179 lb 14.3 oz)   SpO2: 100% 100% 100% 100%    Intake/Output Summary (Last 24 hours) at 04/12/14 1544 Last data filed at 04/12/14 1500  Gross per 24 hour  Intake   3560 ml  Output   2850 ml  Net    710 ml    Exam:   General:  Pt is alert, follows commands appropriately, not in acute distress  Cardiovascular: Regular rate and rhythm, S1/S2, no murmurs, no rubs, no gallops  Respiratory: Clear to auscultation bilaterally, no wheezing, no crackles, no rhonchi  Abdomen: Soft, non tender, non distended, bowel sounds present, no guarding  Extremities: No edema, pulses DP and PT palpable bilaterally  Neuro: Grossly nonfocal  Data Reviewed: Basic Metabolic Panel:  Recent Labs Lab 04/11/14 1559 04/11/14 1905 04/12/14 0503  NA 138 138 137  K 3.4* 3.6 4.4  CL 100 105 103  CO2 24 21 23   GLUCOSE 111* 82 149*  BUN 12 9 8   CREATININE 0.96 0.76 0.69  CALCIUM 9.9 8.6 8.9  MG  --  1.6  --   PHOS  --  3.2  --    Liver Function Tests:  Recent Labs Lab 04/11/14 1559 04/11/14 1905 04/12/14 0503  AST 19 13 14   ALT 14 11 11   ALKPHOS 46 37* 40  BILITOT 0.7 0.9 0.5  PROT 9.1* 7.4 7.9  ALBUMIN 4.6 3.7 3.9    Recent Labs Lab 04/11/14 1559  LIPASE 23   CBC:  Recent Labs Lab 04/11/14 1559  04/11/14 1905 04/12/14 0503  WBC 15.6* 13.3* 10.8*  NEUTROABS 13.2* 11.2*  --   HGB 11.9* 10.1* 10.8*  HCT 37.8* 32.5* 35.2*  MCV 73.5* 73.4* 73.6*  PLT 473* 424* 472*   CBG:  Recent Labs Lab 04/12/14 0715  GLUCAP 123*     Scheduled Meds: . feeding supplement (RESOURCE BREEZE)  1 Container Oral TID BM  . methylPREDNISolone (SOLU-MEDROL) injection  40 mg Intravenous Q24H  . pantoprazole (PROTONIX) IV  40 mg Intravenous Q12H  . sodium chloride  3 mL Intravenous Q12H   Continuous Infusions: . sodium chloride Stopped (04/11/14 1945)  . sodium chloride 75 mL/hr at 04/12/14 1340

## 2014-04-12 NOTE — Progress Notes (Signed)
Taking over care of pt, agree with previous RNs assessment. Will continue to monitor.  

## 2014-04-12 NOTE — Progress Notes (Signed)
INITIAL NUTRITION ASSESSMENT  DOCUMENTATION CODES Per approved criteria  -Non-severe (moderate) malnutrition in the context of chronic illness  Pt meets criteria for moderate MALNUTRITION in the context of chronic illness as evidenced by mild-moderate fat and muscle depletion and energy intake <75% for >1 month.  INTERVENTION: Continue Resource Breeze po TID, each supplement provides 250 kcal and 9 grams of protein Provided "Inflammatory Bowel Disease Nutrition Therapy" handout from the Academy of Nutrition and Dietetics Provided low FODMAP diet education Encourage PO intake  NUTRITION DIAGNOSIS: Altered GI function related to crohns disease as evidenced by bloody stools x 3 months.   Goal: Pt to meet >/= 90% of their estimated nutrition needs   Monitor:  PO and supplemental intake, weight, labs, I/O's  Reason for Assessment: Pt identified as at nutrition risk on the Malnutrition Screen Tool  Admitting Dx: Exacerbation of Crohn's disease  ASSESSMENT: 29 year old male with history of Crohn's disease, has had hospitalization in 08/2013 for exacerbation of Crohn's, apparently got laid off from his job and under lot of stress. Reported nausea and vomiting for last few days with some blood in stool with bowel movement.   Pt with history of Crohn's disease for 11 years. Pt has had bloody stools with N/V for the past 3 months. Per CM note, pt has not taken his Crohn's medications since September or October.   Per weight history, pt's weight has remained steady. Pt states that he used to weigh up to 220 lb when he was taking steroids.   Pt sipping on a [employer] during visit. Encouraged pt to continue to drink supplement while on liquid diet.   Pt reports having diet education "a while ago" but requested information again. RD provided "Inflammatory Bowel Disease Nutrition Therapy" handout from the Academy of Nutrition and Dietetics and low FODMAP diet education. Encouraged pt to see  outpatient RD if diet management becomes too stressful.  Nutrition Focused Physical Exam:  Subcutaneous Fat:  Orbital Region: mild depletion Upper Arm Region: mild depletion Thoracic and Lumbar Region: NA  Muscle:  Temple Region: WNL Clavicle Bone Region: moderate depletion Clavicle and Acromion Bone Region: moderate depletion Scapular Bone Region: NA Dorsal Hand: mild depletion Patellar Region: NA Anterior Thigh Region: NA Posterior Calf Region: NA  Edema: no LE edema   Labs reviewed: WNL  Height: Ht Readings from Last 1 Encounters:  04/11/14  (1.905 m)    Weight: Wt Readings from Last 1 Encounters:  04/12/14 179 lb 14.3 oz (81.6 kg)    Ideal Body Weight: 196 lb  % Ideal Body Weight: 91%  Wt Readings from Last 10 Encounters:  04/12/14 179 lb 14.3 oz (81.6 kg)  09/17/13 180 lb (81.647 kg)  08/27/13 166 lb 4.8 oz (75.433 kg)  09/19/12 221 lb 9.6 oz (100.517 kg)  08/29/12 219 lb (99.338 kg)    Usual Body Weight: 170-185 lb -per pt  % Usual Body Weight: 100%  BMI:  Body mass index is 22.49 kg/(m^2).  Estimated Nutritional Needs: Kcal: 2400-2500 Protein: 110-120g Fluid: 2.4L/day  Skin: intact  Diet Order: Diet clear liquid  EDUCATION NEEDS: -Education needs addressed   Intake/Output Summary (Last 24 hours) at 04/12/14 1609 Last data filed at 04/12/14 1500  Gross per 24 hour  Intake   3560 ml  Output   2850 ml  Net    710 ml    Last BM: 2/4  Labs:   Recent Labs Lab 04/11/14 1559 04/11/14 1905 04/12/14 0503  NA 138  138 137  K 3.4* 3.6 4.4  CL 100 105 103  CO2 24 21 23   BUN 12 9 8   CREATININE 0.96 0.76 0.69  CALCIUM 9.9 8.6 8.9  MG  --  1.6  --   PHOS  --  3.2  --   GLUCOSE 111* 82 149*    CBG (last 3)   Recent Labs  04/12/14 0715  GLUCAP 123*    Scheduled Meds: . feeding supplement (RESOURCE BREEZE)  1 Container Oral TID BM  . methylPREDNISolone (SOLU-MEDROL) injection  40 mg Intravenous Q24H  . pantoprazole  (PROTONIX) IV  40 mg Intravenous Q12H  . sodium chloride  3 mL Intravenous Q12H    Continuous Infusions: . sodium chloride Stopped (04/11/14 1945)  . sodium chloride 75 mL/hr at 04/12/14 1340    Past Medical History  Diagnosis Date  . Crohn disease   . Anemia     required transfusion of bood in 2008 or 2009  . 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   . Internal hemorrhoids     Past Surgical History  Procedure Laterality Date  . Neg hx      Tilda Franco, MS, RD, LDN Pager: [phone] After Hours Pager: [phone]

## 2014-04-12 NOTE — Care Management Note (Addendum)
    Page 1 of 1   04/17/2014     11:48:25 AM CARE MANAGEMENT NOTE 04/17/2014  Patient:  Troy Ramirez, Troy Ramirez   Account Number:  [id number]  Date Initiated:  04/12/2014  Documentation initiated by:  Lanier Clam  Subjective/Objective Assessment:   29 y/o m admitted w/Rectal Bleed,Crohn's. PB:AQVOH'C.     Action/Plan:   From home.States just fired from work.Has GI specialist.Has pharmacy.   Anticipated DC Date:  04/17/2014   Anticipated DC Plan:  HOME/SELF CARE      DC Planning Services  CM consult  [facility]      Choice offered to / List presented to:             Status of service:  Completed, signed off Medicare Important Message given?   (If response is "NO", the following Medicare IM given date fields will be blank) Date Medicare IM given:   Medicare IM given by:   Date Additional Medicare IM given:   Additional Medicare IM given by:    Discharge Disposition:  HOME/SELF CARE  Per UR Regulation:  Reviewed for med. necessity/level of care/duration of stay  If discussed at Long Length of Stay Meetings, dates discussed:   04/16/2014    Comments:  04/17/14 Lanier Clam RN BSN NCM [phone] Provided patient w/good rx discount card(discounted medicine card) PCP appt set w/[facility] see f/u section of d/c-04/19/14 @9a .patient voiced understanding.No further d/c needs.  04/16/14 Lanier Clam RN BSN NCM [phone] Pain control issues.Advancing diet.No anticipated d/c needs.  04/12/14 Lanier Clam RN BSN NCM [phone] See ED CM note.Confirmed w/admitting patient's [employer] still active till 05/11/14.Patient voiced understanding.He has script coverage.Provided w/$4 [facility] med list as resource.Not eligible for [facility] Program.Patient voiced understanding.No anticipated d/c needs.

## 2014-04-13 DIAGNOSIS — E440 Moderate protein-calorie malnutrition: Secondary | ICD-10-CM | POA: Insufficient documentation

## 2014-04-13 LAB — BASIC METABOLIC PANEL
ANION GAP: 8 (ref 5–15)
BUN: 9 mg/dL (ref 6–23)
CALCIUM: 9.2 mg/dL (ref 8.4–10.5)
CHLORIDE: 103 mmol/L (ref 96–112)
CO2: 27 mmol/L (ref 19–32)
CREATININE: 0.81 mg/dL (ref 0.50–1.35)
GFR calc non Af Amer: >90 mL/min (ref >90)
Glucose, Bld: 117 mg/dL — ABNORMAL HIGH (ref 70–99)
Potassium: 4.5 mmol/L (ref 3.5–5.1)
Sodium: 138 mmol/L (ref 135–145)

## 2014-04-13 LAB — CBC
HCT: 35.7 % — ABNORMAL LOW (ref 39.0–52.0)
HEMOGLOBIN: 11 g/dL — AB (ref 13.0–17.0)
MCH: 22.8 pg — ABNORMAL LOW (ref 26.0–34.0)
MCHC: 30.8 g/dL (ref 30.0–36.0)
MCV: 74.1 fL — ABNORMAL LOW (ref 78.0–100.0)
PLATELETS: 440 10*3/uL — AB (ref 150–400)
RBC: 4.82 MIL/uL (ref 4.22–5.81)
RDW: 14.8 % (ref 11.5–15.5)
WBC: 12.5 10*3/uL — AB (ref 4.0–10.5)

## 2014-04-13 LAB — GLUCOSE, CAPILLARY: GLUCOSE-CAPILLARY: 133 mg/dL — AB (ref 70–99)

## 2014-04-13 NOTE — Progress Notes (Signed)
Patient ID: Troy Ramirez, male   DOB: November 09, 1985, 29 y.o.   MRN: [id number]  [facility] PROGRESS NOTE  Seydina Holliman WUJ:[id number] DOB: [birth year] DOA: 04/11/2014 PCP: No PCP Per Patient    Brief narrative:    29 year old male with history of Crohn's disease, has had hospitalization in 08/2013 for exacerbation of Crohn's, apparently got laid off from his job and under lot of stress. Reported nausea and vomiting for last few days with some blood in stool. No fevers or chills. He has some abdominal discomfort in mid abdomen, cramp like and relieved with rest. No specific aggravating factor. No diarrhea or constipation. No chest pain, no shortness of breath or palpitations. No urinary complaints.   In ED, pt hemodynamically stable. Blood work notable for leukocytosis of 15.6. Abdominal pain, nausea and vomiting persisted in ED even with solumedrol 125 mg IV once, analgesia and antiemetics given, admitted for further management of crohn's disease.   Assessment/Plan:    Principal Problem:  Abdominal pain / Crohn's disease exacerbation  - possibly is from Crohn's flare up - given solumedrol 125 mg IV in ED. Will continue with 40 mg IV daily. - continue to provide supportive care with IV fluids, analgesia as needed for pain control - advance diet  - antiemetics as needed   Active Problems:  Lower GI bleed / Anemia of chronic disease - possible from Crohn's disease - Hg remaining stable  - no current indications for transfusion - continue protonix 40 mg IV Q 12 hours   Leukocytosis - secondary to chronic steroid use - no evidence of infection  - WBC is trending down     Hypokalemia - due to GI losses - supplemented and WNL this AM    Severe PCM - in the context of chronic illness  - advance diet as pt able to tolerate   Code Status: Full.  Family Communication: plan of care discussed with the patient Disposition Plan: Home when stable.   IV access:   Peripheral IV  Procedures and diagnostic studies:   None   Medical Consultants:  None   Other Consultants:  None  IAnti-Infectives:   None  Debbora Presto, MD  [facility] Pager [phone]  If 7PM-7AM, please contact night-coverage [email or web address] Password [email or web address] 04/13/2014, 2:59 PM   LOS: 2 days   HPI/Subjective: No events overnight.   Objective: Filed Vitals:   04/12/14 1450 04/12/14 2057 04/13/14 0500 04/13/14 1441  BP: 147/93 144/89 144/95 140/82  Pulse: 65 67 84 90  Temp: 98.1 F (36.7 C) 97.6 F (36.4 C) 97.8 F (36.6 C) 98 F (36.7 C)  TempSrc: Oral Oral Oral Oral  Resp: Height:      Weight:   77.021 kg (169 lb 12.8 oz)   SpO2: 100% 100% 100% 100%    Intake/Output Summary (Last 24 hours) at 04/13/14 1459 Last data filed at 04/13/14 1342  Gross per 24 hour  Intake 1321.67 ml  Output   2550 ml  Net -1228.33 ml    Exam:   General:  Pt is alert, follows commands appropriately, not in acute distress  Cardiovascular: Regular rate and rhythm, S1/S2, no murmurs, no rubs, no gallops  Respiratory: Clear to auscultation bilaterally, no wheezing, no crackles, no rhonchi  Abdomen: Soft, non tender, non distended, bowel sounds present, no guarding  Extremities: No edema, pulses DP and PT palpable bilaterally  Neuro: Grossly nonfocal  Data Reviewed: Basic Metabolic Panel:  Recent Labs Lab 04/11/14 1559 04/11/14  1905 04/12/14 0503 04/13/14 0410  NA 138 138 137 138  K 3.4* 3.6 4.4 4.5  CL 100 105 103 103  CO2 GLUCOSE 111* 82 149* 117*  BUN CREATININE 0.96 0.76 0.69 0.81  CALCIUM 9.9 8.6 8.9 9.2  MG  --  1.6  --   --   PHOS  --  3.2  --   --    Liver Function Tests:  Recent Labs Lab 04/11/14 1559 04/11/14 1905 04/12/14 0503  AST ALT ALKPHOS 46 37* 40  BILITOT 0.7 0.9 0.5  PROT 9.1* 7.4 7.9  ALBUMIN 4.6 3.7 3.9    Recent Labs Lab 04/11/14 1559  LIPASE 23    CBC:  Recent Labs Lab 04/11/14 1559 04/11/14 1905 04/12/14 0503 04/13/14 0410  WBC 15.6* 13.3* 10.8* 12.5*  NEUTROABS 13.2* 11.2*  --   --   HGB 11.9* 10.1* 10.8* 11.0*  HCT 37.8* 32.5* 35.2* 35.7*  MCV 73.5* 73.4* 73.6* 74.1*  PLT 473* 424* 472* 440*   CBG:  Recent Labs Lab 04/12/14 0715 04/13/14 0744  GLUCAP 123* 133*    Scheduled Meds: . methylPREDNISolone   40 mg Intravenous Q24H  . Pantoprazole IV  40 mg Intravenous Q12H  . sodium chloride  3 mL Intravenous Q12H   Continuous Infusions: . sodium chloride 75 mL/hr at 04/13/14 1446

## 2014-04-13 NOTE — Progress Notes (Signed)
Pt has tolerated soft diet today, no BMs

## 2014-04-13 NOTE — Progress Notes (Signed)
Pt vomited after supper, zofran given

## 2014-04-14 LAB — CBC
HEMATOCRIT: 34.7 % — AB (ref 39.0–52.0)
Hemoglobin: 11 g/dL — ABNORMAL LOW (ref 13.0–17.0)
MCH: 23.4 pg — AB (ref 26.0–34.0)
MCHC: 31.7 g/dL (ref 30.0–36.0)
MCV: 73.7 fL — AB (ref 78.0–100.0)
PLATELETS: 468 10*3/uL — AB (ref 150–400)
RBC: 4.71 MIL/uL (ref 4.22–5.81)
RDW: 14.6 % (ref 11.5–15.5)
WBC: 11.8 10*3/uL — AB (ref 4.0–10.5)

## 2014-04-14 LAB — BASIC METABOLIC PANEL
ANION GAP: 8 (ref 5–15)
BUN: 9 mg/dL (ref 6–23)
CALCIUM: 8.9 mg/dL (ref 8.4–10.5)
CO2: 27 mmol/L (ref 19–32)
CREATININE: 0.72 mg/dL (ref 0.50–1.35)
Chloride: 104 mmol/L (ref 96–112)
GFR calc Af Amer: >90 mL/min (ref >90)
GFR calc non Af Amer: >90 mL/min (ref >90)
GLUCOSE: 128 mg/dL — AB (ref 70–99)
Potassium: 4 mmol/L (ref 3.5–5.1)
SODIUM: 139 mmol/L (ref 135–145)

## 2014-04-14 LAB — GLUCOSE, CAPILLARY: Glucose-Capillary: 119 mg/dL — ABNORMAL HIGH (ref 70–99)

## 2014-04-14 NOTE — Progress Notes (Signed)
Patient ID: Troy Ramirez, male   DOB: 05-07-1985, 29 y.o.   MRN: [id number]  [facility] PROGRESS NOTE  Khyir Sowells ACZ:[id number] DOB: 1985/06/16 DOA: 04/11/2014 PCP: No PCP Per Patient   Brief narrative:    29 year old male with history of Crohn's disease, has had hospitalization in 08/2013 for exacerbation of Crohn's, apparently got laid off from his job and under lot of stress. Reported nausea and vomiting for last few days with some blood in stool. No fevers or chills. He has some abdominal discomfort in mid abdomen, cramp like and relieved with rest. No specific aggravating factor. No diarrhea or constipation. No chest pain, no shortness of breath or palpitations. No urinary complaints.   In ED, pt hemodynamically stable. Blood work notable for leukocytosis of 15.6. Abdominal pain, nausea and vomiting persisted in ED even with solumedrol 125 mg IV once, analgesia and antiemetics given, admitted for further management of crohn's disease.   Assessment/Plan:    Principal Problem:  Abdominal pain / Crohn's disease exacerbation  - from Crohn's flare up - given solumedrol 125 mg IV in ED. Will continue with 40 mg IV daily - pt reports feeling better this AM - continue to provide supportive care with IV fluids, analgesia as needed for pain control - advance diet  - antiemetics as needed   Active Problems:  Lower GI bleed / Anemia of chronic disease - possible from Crohn's disease - Hg remaining stable  - no current indications for transfusion, no signs of stable  - continue protonix 40 mg IV Q 12 hours   Leukocytosis - secondary to chronic steroid use - no evidence of infection  - WBC is trending down     Hypokalemia - due to GI losses - supplemented and WNL this AM   Severe PCM - in the context of chronic illness  - advance diet as pt able to tolerate   Code Status: Full.  Family Communication: plan of care discussed with the patient Disposition  Plan: Home when stable.   IV access:  Peripheral IV  Procedures and diagnostic studies:   None   Medical Consultants:  None   Other Consultants:  None  IAnti-Infectives:   None  Debbora Presto, MD  [facility] Pager [phone]  If 7PM-7AM, please contact night-coverage [email or web address] Password [email or web address] 04/14/2014, 1:34 PM   LOS: 3 days   HPI/Subjective: No events overnight.   Objective: Filed Vitals:   04/13/14 1441 04/13/14 2034 04/14/14 0526 04/14/14 1302  BP: 140/82 147/92 144/91 146/85  Pulse: 90 83 72 82  Temp: 98 F (36.7 C) 98.5 F (36.9 C) 98 F (36.7 C) 98.1 F (36.7 C)  TempSrc: Oral Oral Oral Oral  Resp: 18 20 20 20   Height:      Weight:   76.522 kg (168 lb 11.2 oz)   SpO2: 100% 100% 100% 99%    Intake/Output Summary (Last 24 hours) at 04/14/14 1334 Last data filed at 04/14/14 1300  Gross per 24 hour  Intake   4585 ml  Output   1620 ml  Net   2965 ml    Exam:   General:  Pt is alert, follows commands appropriately, not in acute distress  Cardiovascular: Regular rate and rhythm, S1/S2, no murmurs, no rubs, no gallops  Respiratory: Clear to auscultation bilaterally, no wheezing, no crackles, no rhonchi  Abdomen: Soft, non tender, non distended, bowel sounds present, no guarding  Extremities: No edema, pulses DP and PT palpable bilaterally  Neuro: Grossly nonfocal  Data Reviewed: Basic Metabolic Panel:  Recent Labs Lab 04/11/14 1559 04/11/14 1905 04/12/14 0503 04/13/14 0410 04/14/14 0549  NA 138 138 137 138 139  K 3.4* 3.6 4.4 4.5 4.0  CL 100 105 103 103 104  CO2 GLUCOSE 111* 82 149* 117* 128*  BUN CREATININE 0.96 0.76 0.69 0.81 0.72  CALCIUM 9.9 8.6 8.9 9.2 8.9  MG  --  1.6  --   --   --   PHOS  --  3.2  --   --   --    Liver Function Tests:  Recent Labs Lab 04/11/14 1559 04/11/14 1905 04/12/14 0503  AST ALT ALKPHOS 46 37* 40  BILITOT 0.7 0.9 0.5   PROT 9.1* 7.4 7.9  ALBUMIN 4.6 3.7 3.9    Recent Labs Lab 04/11/14 1559  LIPASE 23   CBC:  Recent Labs Lab 04/11/14 1559 04/11/14 1905 04/12/14 0503 04/13/14 0410 04/14/14 0549  WBC 15.6* 13.3* 10.8* 12.5* 11.8*  NEUTROABS 13.2* 11.2*  --   --   --   HGB 11.9* 10.1* 10.8* 11.0* 11.0*  HCT 37.8* 32.5* 35.2* 35.7* 34.7*  MCV 73.5* 73.4* 73.6* 74.1* 73.7*  PLT 473* 424* 472* 440* 468*   CBG:  Recent Labs Lab 04/12/14 0715 04/13/14 0744 04/14/14 0805  GLUCAP 123* 133* 119*   Scheduled Meds: . feeding supplement (RESOURCE BREEZE)  1 Container Oral TID BM  . methylPREDNISolone (SOLU-MEDROL) injection  40 mg Intravenous Q24H  . pantoprazole (PROTONIX) IV  40 mg Intravenous Q12H  . sodium chloride  3 mL Intravenous Q12H   Continuous Infusions: . sodium chloride 75 mL/hr at 04/13/14 1446

## 2014-04-15 DIAGNOSIS — R1013 Epigastric pain: Secondary | ICD-10-CM

## 2014-04-15 LAB — BASIC METABOLIC PANEL
Anion gap: 9 (ref 5–15)
BUN: 11 mg/dL (ref 6–23)
CO2: 28 mmol/L (ref 19–32)
Calcium: 8.6 mg/dL (ref 8.4–10.5)
Chloride: 101 mmol/L (ref 96–112)
Creatinine, Ser: 0.74 mg/dL (ref 0.50–1.35)
GFR calc Af Amer: >90 mL/min (ref >90)
GLUCOSE: 97 mg/dL (ref 70–99)
POTASSIUM: 3.1 mmol/L — AB (ref 3.5–5.1)
Sodium: 138 mmol/L (ref 135–145)

## 2014-04-15 LAB — GLUCOSE, CAPILLARY: Glucose-Capillary: 92 mg/dL (ref 70–99)

## 2014-04-15 MED ORDER — PREDNISONE 50 MG PO TABS
50.0000 mg | ORAL_TABLET | Freq: Every day | ORAL | Status: DC
  Administered 2014-04-16 – 2014-04-17 (×2): 50 mg via ORAL
  Filled 2014-04-15 (×2): qty 1

## 2014-04-15 MED ORDER — POTASSIUM CHLORIDE CRYS ER 20 MEQ PO TBCR
40.0000 meq | EXTENDED_RELEASE_TABLET | Freq: Once | ORAL | Status: AC
  Administered 2014-04-15: 40 meq via ORAL
  Filled 2014-04-15: qty 2

## 2014-04-15 NOTE — Progress Notes (Signed)
Patient ID: Troy Ramirez, male   DOB: [birth year], 29 y.o.   MRN: [id number]  [facility] PROGRESS NOTE  Troy Ramirez WUJ:[id number] DOB: [birth year] DOA: 04/11/2014 PCP: No PCP Per Patient  Brief narrative:    29 year old male with history of Crohn's disease, has had hospitalization in 08/2013 for exacerbation of Crohn's, apparently got laid off from his job and under lot of stress. Reported nausea and vomiting for last few days with some blood in stool. No fevers or chills. He has some abdominal discomfort in mid abdomen, cramp like and relieved with rest. No specific aggravating factor. No diarrhea or constipation. No chest pain, no shortness of breath or palpitations. No urinary complaints.   In ED, pt hemodynamically stable. Blood work notable for leukocytosis of 15.6. Abdominal pain, nausea and vomiting persisted in ED even with solumedrol 125 mg IV once, analgesia and antiemetics given, admitted for further management of crohn's disease.   Assessment/Plan:    Principal Problem:  Abdominal pain / Crohn's disease exacerbation  - from Crohn's flare up - given solumedrol 125 mg IV in ED. Will continue with 40 mg IV daily and transition to oral prednisone today  - pt reports feeling better this AM - continue to provide supportive care with IV fluids, analgesia as needed for pain control - advance diet  - antiemetics as needed   Active Problems:  Lower GI bleed / Anemia of chronic disease - possible from Crohn's disease - Hg remaining stable  - no current indications for transfusion, no signs of stable  - continue protonix 40 mg IV Q 12 hours   Leukocytosis - secondary to chronic steroid use - no evidence of infection  - WBC is trending down     Hypokalemia - due to GI losses - supplement and repeat BMP in AM   Severe PCM - in the context of chronic illness  - advance diet as pt able to tolerate   Code Status: Full.  Family Communication: plan of  care discussed with the patient Disposition Plan: Home when stable.   IV access:  Peripheral IV  Procedures and diagnostic studies:   None   Medical Consultants:  None   Other Consultants:  None  IAnti-Infectives:   None   IAnti-Infectives:     Debbora Presto, MD  [facility] Pager [phone]  If 7PM-7AM, please contact night-coverage [email or web address] Password [facility] 04/15/2014, 5:21 PM   LOS: 4 days   HPI/Subjective: No events overnight.   Objective: Filed Vitals:   04/14/14 1302 04/14/14 2046 04/15/14 0602 04/15/14 1327  BP: 146/85 149/94 150/95 158/95  Pulse: 82 90 67 92  Temp: 98.1 F (36.7 C) 99.4 F (37.4 C) 98 F (36.7 C) 97.8 F (36.6 C)  TempSrc: Oral Oral Oral Oral  Resp: Height:      Weight:   76.522 kg (168 lb 11.2 oz)   SpO2: 99% 99% 100% 99%    Intake/Output Summary (Last 24 hours) at 04/15/14 1721 Last data filed at 04/15/14 1300  Gross per 24 hour  Intake 2113.33 ml  Output   1402 ml  Net 711.33 ml    Exam:   General:  Pt is alert, follows commands appropriately, not in acute distress  Cardiovascular: Regular rate and rhythm, S1/S2, no murmurs, no rubs, no gallops  Respiratory: Clear to auscultation bilaterally, no wheezing, no crackles, no rhonchi  Abdomen: Soft, non tender, non distended, bowel sounds present, no guarding  Extremities: No edema, pulses DP  and PT palpable bilaterally  Neuro: Grossly nonfocal  Data Reviewed: Basic Metabolic Panel:  Recent Labs Lab 04/11/14 1905 04/12/14 0503 04/13/14 0410 04/14/14 0549 04/15/14 0531  NA 138 137 138 139 138  K 3.6 4.4 4.5 4.0 3.1*  CL 105 103 103 104 101  CO2 21 23 27 27 28   GLUCOSE 82 149* 117* 128* 97  BUN 9 8 9 9 11   CREATININE 0.76 0.69 0.81 0.72 0.74  CALCIUM 8.6 8.9 9.2 8.9 8.6  MG 1.6  --   --   --   --   PHOS 3.2  --   --   --   --    Liver Function Tests:  Recent Labs Lab 04/11/14 1559 04/11/14 1905  04/12/14 0503  AST 19 13 14   ALT 14 11 11   ALKPHOS 46 37* 40  BILITOT 0.7 0.9 0.5  PROT 9.1* 7.4 7.9  ALBUMIN 4.6 3.7 3.9    Recent Labs Lab 04/11/14 1559  LIPASE 23   CBC:  Recent Labs Lab 04/11/14 1559 04/11/14 1905 04/12/14 0503 04/13/14 0410 04/14/14 0549  WBC 15.6* 13.3* 10.8* 12.5* 11.8*  NEUTROABS 13.2* 11.2*  --   --   --   HGB 11.9* 10.1* 10.8* 11.0* 11.0*  HCT 37.8* 32.5* 35.2* 35.7* 34.7*  MCV 73.5* 73.4* 73.6* 74.1* 73.7*  PLT 473* 424* 472* 440* 468*   CBG:  Recent Labs Lab 04/12/14 0715 04/13/14 0744 04/14/14 0805 04/15/14 0737  GLUCAP 123* 133* 119* 92    Scheduled Meds: . feeding supplement (RESOURCE BREEZE)  1 Container Oral TID BM  . methylPREDNISolone (SOLU-MEDROL) injection  40 mg Intravenous Q24H  . pantoprazole (PROTONIX) IV  40 mg Intravenous Q12H  . sodium chloride  3 mL Intravenous Q12H   Continuous Infusions: . sodium chloride 50 mL/hr at 04/15/14 1204

## 2014-04-16 LAB — CBC
HCT: 32.8 % — ABNORMAL LOW (ref 39.0–52.0)
HEMOGLOBIN: 10.1 g/dL — AB (ref 13.0–17.0)
MCH: 22.6 pg — ABNORMAL LOW (ref 26.0–34.0)
MCHC: 30.8 g/dL (ref 30.0–36.0)
MCV: 73.5 fL — ABNORMAL LOW (ref 78.0–100.0)
Platelets: 489 10*3/uL — ABNORMAL HIGH (ref 150–400)
RBC: 4.46 MIL/uL (ref 4.22–5.81)
RDW: 14.7 % (ref 11.5–15.5)
WBC: 12.4 10*3/uL — ABNORMAL HIGH (ref 4.0–10.5)

## 2014-04-16 LAB — BASIC METABOLIC PANEL
ANION GAP: 10 (ref 5–15)
BUN: 12 mg/dL (ref 6–23)
CHLORIDE: 103 mmol/L (ref 96–112)
CO2: 28 mmol/L (ref 19–32)
Calcium: 9.2 mg/dL (ref 8.4–10.5)
Creatinine, Ser: 0.77 mg/dL (ref 0.50–1.35)
GFR calc non Af Amer: >90 mL/min (ref >90)
Glucose, Bld: 94 mg/dL (ref 70–99)
POTASSIUM: 3.7 mmol/L (ref 3.5–5.1)
Sodium: 141 mmol/L (ref 135–145)

## 2014-04-16 LAB — GLUCOSE, CAPILLARY: Glucose-Capillary: 91 mg/dL (ref 70–99)

## 2014-04-16 MED ORDER — PREDNISONE 5 MG PO TABS: ORAL_TABLET | ORAL | Status: DC

## 2014-04-16 MED ORDER — HYDROMORPHONE HCL 2 MG PO TABS
2.0000 mg | ORAL_TABLET | ORAL | Status: DC | PRN
  Administered 2014-04-16 – 2014-04-17 (×6): 2 mg via ORAL
  Filled 2014-04-16 (×6): qty 1

## 2014-04-16 MED ORDER — LORAZEPAM 1 MG PO TABS
1.0000 mg | ORAL_TABLET | Freq: Three times a day (TID) | ORAL | Status: DC | PRN
  Administered 2014-04-16 – 2014-04-17 (×3): 1 mg via ORAL
  Filled 2014-04-16 (×3): qty 1

## 2014-04-16 MED ORDER — LORAZEPAM 1 MG PO TABS: 1.0000 mg | ORAL_TABLET | Freq: Three times a day (TID) | ORAL | Status: DC | PRN

## 2014-04-16 MED ORDER — HYDROMORPHONE HCL 2 MG PO TABS: 2.0000 mg | ORAL_TABLET | ORAL | Status: DC | PRN

## 2014-04-16 MED ORDER — HYDROMORPHONE HCL 1 MG/ML IJ SOLN
1.0000 mg | Freq: Once | INTRAMUSCULAR | Status: AC
  Administered 2014-04-16: 1 mg via INTRAVENOUS
  Filled 2014-04-16: qty 1

## 2014-04-16 MED ORDER — PANTOPRAZOLE SODIUM 40 MG PO TBEC
40.0000 mg | DELAYED_RELEASE_TABLET | Freq: Two times a day (BID) | ORAL | Status: DC
  Administered 2014-04-16 – 2014-04-17 (×2): 40 mg via ORAL
  Filled 2014-04-16 (×2): qty 1

## 2014-04-16 NOTE — Progress Notes (Signed)
Key Points: Use following P&T approved IV to PO  change policy.  Description contains the criteria that are approved Note: Policy Excludes:  Esophagectomy patientsPHARMACIST - PHYSICIAN COMMUNICATION DR:   Izola Price CONCERNING: IV to Oral Route Change Policy  RECOMMENDATION: This patient is receiving protonix by the intravenous route.  Based on criteria approved by the Pharmacy and Therapeutics Committee, the intravenous medication(s) is/are being converted to the equivalent oral dose form(s).   DESCRIPTION: These criteria include:  The patient is eating (either orally or via tube) and/or has been taking other orally administered medications for a least 24 hours  The patient has no evidence of active gastrointestinal bleeding or impaired GI absorption (gastrectomy, short bowel, patient on TNA or NPO).  If you have questions about this conversion, please contact the Pharmacy Department  []   [phone] )  [facility] []   [phone] )  [facility]  []   [phone] )  [facility] [x]   [phone] )  [facility]   Arley Phenix RPh 04/16/2014, 1:52 PM Pager [phone]

## 2014-04-16 NOTE — Discharge Summary (Signed)
Physician Discharge Summary  Troy Ramirez ZOX:[id number] DOB: 1985/10/01 DOA: 04/11/2014  PCP: No PCP Per Patient  Admit date: 04/11/2014 Discharge date: 04/17/2014  Recommendations for Outpatient Follow-up:  1. Pt will need to follow up with PCP in 2-3 weeks post discharge 2. Please obtain BMP to evaluate electrolytes and kidney function 3. Please also check CBC to evaluate Hg and Hct levels 4. Pt to continue with Prednisone taper as noted below and advised to follow up with GI specialist as soon as possible   Discharge Diagnoses:  Principal Problem:   Exacerbation of Crohn's disease Active Problems:   Abdominal pain   GI bleed  Discharge Condition: Stable  Diet recommendation: Heart healthy diet discussed in details   Brief narrative:    29 year old male with history of Crohn's disease, has had hospitalization in 08/2013 for exacerbation of Crohn's, apparently got laid off from his job and under lot of stress. Reported nausea and vomiting for last few days with some blood in stool. No fevers or chills. He has some abdominal discomfort in mid abdomen, cramp like and relieved with rest. No specific aggravating factor. No diarrhea or constipation. No chest pain, no shortness of breath or palpitations. No urinary complaints.   In ED, pt hemodynamically stable. Blood work notable for leukocytosis of 15.6. Abdominal pain, nausea and vomiting persisted in ED even with solumedrol 125 mg IV once, analgesia and antiemetics given, admitted for further management of crohn's disease.   Assessment/Plan:    Principal Problem:  Abdominal pain / Crohn's disease exacerbation  - from Crohn's flare up - given solumedrol 125 mg IV in ED, continued with 40 mg IV daily and transitioned to oral prednisone 2/8 50 mg PO - to taper down by 10 mg daily until completed, script provided  - pt reports feeling better this AM - continue to provide supportive care with analgesia as needed for pain  control - advanced diet  - antiemetics as needed   Active Problems:  Lower GI bleed / Anemia of chronic disease - possible from Crohn's disease - Hg remaining stable  - no current indications for transfusion, no signs of stable    Leukocytosis - secondary to chronic steroid use - no evidence of infection     Hypokalemia - due to GI losses - supplemented and WNL this AM   Severe PCM - in the context of chronic illness  - advance diet as pt able to tolerate   Code Status: Full.  Family Communication: plan of care discussed with the patient Disposition Plan: Home in AM  IV access:  Peripheral IV  Procedures and diagnostic studies:   None   Medical Consultants:  None   Other Consultants:  None  IAnti-Infectives:   None  IAnti-Infectives:     Discharge Exam: Filed Vitals:   04/16/14 1330  BP: 158/98  Pulse: 79  Temp: 98.1 F (36.7 C)  Resp: 18   Filed Vitals:   04/15/14 1327 04/15/14 2127 04/16/14 0505 04/16/14 1330  BP: 158/95 150/98 148/91 158/98  Pulse: 92 82 84 79  Temp: 97.8 F (36.6 C) 97.7 F (36.5 C) 97.4 F (36.3 C) 98.1 F (36.7 C)  TempSrc: Oral Oral Oral Oral  Resp: Height:      Weight:      SpO2: 99% 98% 100% 100%    General: Pt is alert, follows commands appropriately, not in acute distress Cardiovascular: Regular rate and rhythm, S1/S2 +, no murmurs, no rubs,  no gallops Respiratory: Clear to auscultation bilaterally, no wheezing, no crackles, no rhonchi Abdominal: Soft, non tender, non distended, bowel sounds +, no guarding Extremities: no edema, no cyanosis, pulses palpable bilaterally DP and PT Neuro: Grossly nonfocal  Discharge Instructions     Medication List    STOP taking these medications        amLODipine 5 MG tablet  Commonly known as:  NORVASC      TAKE these medications        acetaminophen 500 MG tablet  Commonly known as:  TYLENOL  Take  500-1,000 mg by mouth every 6 (six) hours as needed for pain.     HYDROmorphone 2 MG tablet  Commonly known as:  DILAUDID  Take 1 tablet (2 mg total) by mouth every 3 (three) hours as needed for severe pain.     iron polysaccharides 150 MG capsule  Commonly known as:  NIFEREX  Take 1 capsule daily.     mesalamine 1.2 G EC tablet  Commonly known as:  LIALDA  Take 2 tab twice daily     Methotrexate (PF) 25 MG/0.4ML Soaj  Inject 1 vial into the skin once a week.     predniSONE 5 MG tablet  Commonly known as:  DELTASONE  Take 50 mg tablet 2/10 and taper down by 5 mg daily until completed           Follow-up Information    Follow up with Debbora Presto, MD.   Specialty:  Internal Medicine   Contact information:   [address] [place] [address] [phone]        The results of significant diagnostics from this hospitalization (including imaging, microbiology, ancillary and laboratory) are listed below for reference.     Microbiology: No results found for this or any previous visit (from the past 240 hour(s)).   Labs: Basic Metabolic Panel:  Recent Labs Lab 04/11/14 1905 04/12/14 0503 04/13/14 0410 04/14/14 0549 04/15/14 0531 04/16/14 0503  NA 138 137 138 139 138 141  K 3.6 4.4 4.5 4.0 3.1* 3.7  CL 105 103 103 104 101 103  CO2 GLUCOSE 82 149* 117* 128* 97 94  BUN CREATININE 0.76 0.69 0.81 0.72 0.74 0.77  CALCIUM 8.6 8.9 9.2 8.9 8.6 9.2  MG 1.6  --   --   --   --   --   PHOS 3.2  --   --   --   --   --    Liver Function Tests:  Recent Labs Lab 04/11/14 1559 04/11/14 1905 04/12/14 0503  AST ALT ALKPHOS 46 37* 40  BILITOT 0.7 0.9 0.5  PROT 9.1* 7.4 7.9  ALBUMIN 4.6 3.7 3.9    Recent Labs Lab 04/11/14 1559  LIPASE 23   No results for input(s): AMMONIA in the last 168 hours. CBC:  Recent Labs Lab 04/11/14 1559 04/11/14 1905 04/12/14 0503 04/13/14 0410  04/14/14 0549 04/16/14 0503  WBC 15.6* 13.3* 10.8* 12.5* 11.8* 12.4*  NEUTROABS 13.2* 11.2*  --   --   --   --   HGB 11.9* 10.1* 10.8* 11.0* 11.0* 10.1*  HCT 37.8* 32.5* 35.2* 35.7* 34.7* 32.8*  MCV 73.5* 73.4* 73.6* 74.1* 73.7* 73.5*  PLT 473* 424* 472* 440* 468* 489*    CBG:  Recent Labs Lab 04/12/14 0715 04/13/14 0744 04/14/14 0805 04/15/14 9147  04/16/14 0756  GLUCAP 123* 133* 119* 92 91     SIGNED: Time coordinating discharge: Over 30 minutes  Debbora Presto, MD  [facility] 04/16/2014, 4:35 PM Pager [phone]  If 7PM-7AM, please contact night-coverage [email or web address] Password [email or web address]

## 2014-04-16 NOTE — Progress Notes (Signed)
Assumed care of pt. No changes in intitial am assessment at this time. Cont with plan of care 

## 2014-04-16 NOTE — Discharge Instructions (Signed)
Crohn Disease  Crohn disease is a long-term (chronic) soreness and redness (inflammation) of the intestines (bowel). It can affect any portion of the digestive tract, from the mouth to the anus. It can also cause problems outside the digestive tract. Crohn disease is closely related to a disease called ulcerative colitis (together, these two diseases are called inflammatory bowel disease).   CAUSES   The cause of Crohn disease is not known. One theory is that, in an easily affected person, the immune system is triggered to attack the body's own digestive tissue. Crohn disease runs in families. It seems to be more common in certain geographic areas and amongst certain races. There are no clear-cut dietary causes.   SYMPTOMS   Crohn disease can cause many different symptoms since it can affect many different parts of the body. Symptoms include:  · Fatigue.  · Weight loss.  · Chronic diarrhea, sometime bloody.  · Abdominal pain and cramps.  · Fever.  · Ulcers or canker sores in the mouth or rectum.  · Anemia (low red blood cells).  · Arthritis, skin problems, and eye problems may occur.  Complications of Crohn disease can include:  · Series of holes (perforation) of the bowel.  · Portions of the intestines sticking to each other (adhesions).  · Obstruction of the bowel.  · Fistula formation, typically in the rectal area but also in other areas. A fistula is an opening between the bowels and the outside, or between the bowels and another organ.  · A painful crack in the mucous membrane of the anus (rectal fissure).  DIAGNOSIS   Your caregiver may suspect Crohn disease based on your symptoms and an exam. Blood tests may confirm that there is a problem. You may be asked to submit a stool specimen for examination. X-rays and CT scans may be necessary. Ultimately, the diagnosis is usually made after a procedure that uses a flexible tube that is inserted via your mouth or your anus. This is done under sedation and is called  either an upper endoscopy or colonoscopy. With these tests, the specialist can take tiny tissue samples and remove them from the inside of the bowel (biopsy). Examination of this biopsy tissue under a microscope can reveal Crohn disease as the cause of your symptoms.  Due to the many different forms that Crohn disease can take, symptoms may be present for several years before a diagnosis is made.  TREATMENT   Medications are often used to decrease inflammation and control the immune system. These include medicines related to aspirin, steroid medications, and newer and stronger medications to slow down the immune system. Some medications may be used as suppositories or enemas. A number of other medications are used or have been studied. Your caregiver will make specific recommendations.  HOME CARE INSTRUCTIONS   · Symptoms such as diarrhea can be controlled with medications. Avoid foods that have a laxative effect such as fresh fruit, vegetables, and dairy products. During flare-ups, you can rest your bowel by refraining from solid foods. Drink clear liquids frequently during the day. (Electrolyte or rehydrating fluids are best. Your caregiver can help you with suggestions.) Drink often to prevent loss of body fluids (dehydration). When diarrhea has cleared, eat small meals and more frequently. Avoid food additives and stimulants such as caffeine (coffee, tea, or chocolate). Enzyme supplements may help if you develop intolerance to a sugar in dairy products (lactose). Ask your caregiver or dietitian about specific dietary instructions.  · Try to   maintain a positive attitude. Learn relaxation techniques such as self-hypnosis, mental imaging, and muscle relaxation.  · If possible, avoid stresses which can aggravate your condition.  · Exercise regularly.  · Follow your diet.  · Always get plenty of rest.  SEEK MEDICAL CARE IF:   · Your symptoms fail to improve after a week or two of new treatment.  · You experience  continued weight loss.  · You have ongoing cramps or loose bowels.  · You develop a new skin rash, skin sores, or eye problems.  SEEK IMMEDIATE MEDICAL CARE IF:   · You have worsening of your symptoms or develop new symptoms.  · You have a fever.  · You develop bloody diarrhea.  · You develop severe abdominal pain.  MAKE SURE YOU:   · Understand these instructions.  · Will watch your condition.  · Will get help right away if you are not doing well or get worse.  Document Released: 12/02/2004 Document Revised: 07/09/2013 Document Reviewed: 10/31/2006  ExitCare® Patient Information ©2015 [employer]. This information is not intended to replace advice given to you by your health care provider. Make sure you discuss any questions you have with your health care provider.

## 2014-04-17 DIAGNOSIS — E876 Hypokalemia: Secondary | ICD-10-CM

## 2014-04-17 LAB — GLUCOSE, CAPILLARY: Glucose-Capillary: 84 mg/dL (ref 70–99)

## 2014-04-17 NOTE — Progress Notes (Signed)
Spoke with patient about discharge plan. Will need appt with  [facility] and with GI. See d/c summary from yesterday.  Calvert Cantor, MD

## 2014-04-19 ENCOUNTER — Encounter (HOSPITAL_COMMUNITY): Payer: Self-pay | Admitting: Emergency Medicine

## 2014-04-19 ENCOUNTER — Ambulatory Visit: Payer: 59 | Attending: Internal Medicine | Admitting: Family Medicine

## 2014-04-19 ENCOUNTER — Encounter: Payer: Self-pay | Admitting: Family Medicine

## 2014-04-19 ENCOUNTER — Emergency Department (HOSPITAL_COMMUNITY)
Admission: EM | Admit: 2014-04-19 | Discharge: 2014-04-19 | Disposition: A | Discharge status: Home / Self Care | Payer: 59 | Attending: Emergency Medicine | Admitting: Emergency Medicine

## 2014-04-19 VITALS — BP 143/84 | HR 85 | Temp 98.2°F | Resp 16 | Ht 75.0 in | Wt 161.0 lb

## 2014-04-19 DIAGNOSIS — K50911 Crohn's disease, unspecified, with rectal bleeding: Secondary | ICD-10-CM | POA: Diagnosis present

## 2014-04-19 DIAGNOSIS — R358 Other polyuria: Secondary | ICD-10-CM | POA: Insufficient documentation

## 2014-04-19 DIAGNOSIS — I10 Essential (primary) hypertension: Secondary | ICD-10-CM | POA: Insufficient documentation

## 2014-04-19 DIAGNOSIS — R55 Syncope and collapse: Secondary | ICD-10-CM

## 2014-04-19 DIAGNOSIS — Z79899 Other long term (current) drug therapy: Secondary | ICD-10-CM | POA: Insufficient documentation

## 2014-04-19 DIAGNOSIS — R000 Tachycardia, unspecified: Secondary | ICD-10-CM | POA: Diagnosis not present

## 2014-04-19 DIAGNOSIS — D649 Anemia, unspecified: Secondary | ICD-10-CM | POA: Insufficient documentation

## 2014-04-19 DIAGNOSIS — E860 Dehydration: Secondary | ICD-10-CM

## 2014-04-19 DIAGNOSIS — M1990 Unspecified osteoarthritis, unspecified site: Secondary | ICD-10-CM | POA: Diagnosis not present

## 2014-04-19 DIAGNOSIS — K5090 Crohn's disease, unspecified, without complications: Secondary | ICD-10-CM

## 2014-04-19 DIAGNOSIS — R197 Diarrhea, unspecified: Secondary | ICD-10-CM | POA: Diagnosis not present

## 2014-04-19 DIAGNOSIS — Z8719 Personal history of other diseases of the digestive system: Secondary | ICD-10-CM | POA: Insufficient documentation

## 2014-04-19 DIAGNOSIS — T671XXD Heat syncope, subsequent encounter: Secondary | ICD-10-CM

## 2014-04-19 LAB — HEMOCCULT GUIAC POC 1CARD (OFFICE): FECAL OCCULT BLD: POSITIVE

## 2014-04-19 LAB — CBC
HCT: 35.1 % — ABNORMAL LOW (ref 39.0–52.0)
HEMOGLOBIN: 10.8 g/dL — AB (ref 13.0–17.0)
MCH: 22.3 pg — AB (ref 26.0–34.0)
MCHC: 30.8 g/dL (ref 30.0–36.0)
MCV: 72.5 fL — ABNORMAL LOW (ref 78.0–100.0)
PLATELETS: 541 10*3/uL — AB (ref 150–400)
RBC: 4.84 MIL/uL (ref 4.22–5.81)
RDW: 14.7 % (ref 11.5–15.5)
WBC: 8.6 10*3/uL (ref 4.0–10.5)

## 2014-04-19 LAB — LIPASE, BLOOD: Lipase: 26 U/L (ref 11–59)

## 2014-04-19 LAB — GLUCOSE, POCT (MANUAL RESULT ENTRY): POC GLUCOSE: 94 mg/dL (ref 70–99)

## 2014-04-19 LAB — COMPREHENSIVE METABOLIC PANEL
ALK PHOS: 33 U/L — AB (ref 39–117)
ALT: 13 U/L (ref 0–53)
AST: 12 U/L (ref 0–37)
Albumin: 4 g/dL (ref 3.5–5.2)
Anion gap: 9 (ref 5–15)
BILIRUBIN TOTAL: 0.5 mg/dL (ref 0.3–1.2)
BUN: 10 mg/dL (ref 6–23)
CHLORIDE: 105 mmol/L (ref 96–112)
CO2: 24 mmol/L (ref 19–32)
Calcium: 8.3 mg/dL — ABNORMAL LOW (ref 8.4–10.5)
Creatinine, Ser: 0.69 mg/dL (ref 0.50–1.35)
GFR calc Af Amer: >90 mL/min (ref >90)
Glucose, Bld: 91 mg/dL (ref 70–99)
POTASSIUM: 3.4 mmol/L — AB (ref 3.5–5.1)
SODIUM: 138 mmol/L (ref 135–145)
Total Protein: 7.6 g/dL (ref 6.0–8.3)

## 2014-04-19 LAB — I-STAT CG4 LACTIC ACID, ED: LACTIC ACID, VENOUS: 1.51 mmol/L (ref 0.5–2.0)

## 2014-04-19 MED ORDER — ONDANSETRON HCL 4 MG PO TABS: 8.0000 mg | ORAL_TABLET | Freq: Once | ORAL | Status: DC

## 2014-04-19 MED ORDER — CLONIDINE HCL 0.1 MG PO TABS
0.2000 mg | ORAL_TABLET | Freq: Once | ORAL | Status: AC
  Administered 2014-04-19: 0.2 mg via ORAL

## 2014-04-19 MED ORDER — SODIUM CHLORIDE 0.9 % IV BOLUS (SEPSIS)
1000.0000 mL | Freq: Once | INTRAVENOUS | Status: AC
  Administered 2014-04-19: 1000 mL via INTRAVENOUS

## 2014-04-19 MED ORDER — SODIUM CHLORIDE 0.9 % IV SOLN: INTRAVENOUS | Status: DC

## 2014-04-19 NOTE — BH Assessment (Signed)
Writer informed TTS Emily of the consult.  

## 2014-04-19 NOTE — ED Notes (Signed)
[facility]. While there, they checked his BP and found it was high. They administered clonidine, then the pt had a near syncopal episode with tachycardia. Pt complaints of lower left abd pain, hx of Chron's Disease

## 2014-04-19 NOTE — Progress Notes (Signed)
Pt being transferred to [facility] via non EMS  Report given to Cement, RN charge Orthostatic  2 liters O2 applied
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
## 2014-04-19 NOTE — Patient Instructions (Signed)
Troy Ramirez,  Because of your syncopal episode we will have you transferred back to the hospital.  Please see me in follow up after discharge.  Dr. Armen Pickup

## 2014-04-19 NOTE — ED Provider Notes (Signed)
CSN: [id number]     Arrival date & time 04/19/14  1137 History   First MD Initiated Contact with Patient 04/19/14 1141     Chief Complaint  Patient presents with  . Near Syncope  . Tachycardia  . Dehydration     (Consider location/radiation/quality/duration/timing/severity/associated sxs/prior Treatment) Patient is a 29 y.o. male presenting with syncope.  Loss of Consciousness Episode history:  Single Most recent episode:  Today Timing:  Constant Progression:  Resolved Chronicity:  New Context: dehydration and standing up   Context comment:  Received clonidine by PCP for hypertension and tachycardia Relieved by:  Nothing Worsened by:  Nothing tried Associated symptoms: no anxiety, no chest pain, no confusion, no fever, no nausea and no vomiting     Past Medical History  Diagnosis Date  . Crohn disease   . Anemia     required transfusion of bood in 2008 or 2009  . 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   . Internal hemorrhoids    Past Surgical History  Procedure Laterality Date  . Neg hx     Family History  Problem Relation Age of Onset  . Diabetes      Both sides  . Heart disease      both sides  . Breast cancer Maternal Grandmother   . Prostate cancer Paternal Grandfather   . Colon cancer Neg Hx    History  Substance Use Topics  . Smoking status: Never Smoker   . Smokeless tobacco: Never Used  . Alcohol Use: Yes     Comment: rarely    Review of Systems  Constitutional: Negative for fever.  Cardiovascular: Positive for syncope. Negative for chest pain.  Gastrointestinal: Positive for diarrhea. Negative for nausea, vomiting and abdominal pain.  Psychiatric/Behavioral: Negative for confusion.  All other systems reviewed and are negative.     Allergies  Humira and Remicade  Home Medications   Prior to Admission medications   Medication Sig Start Date End Date Taking? Authorizing Provider  acetaminophen  (TYLENOL) 500 MG tablet Take 500-1,000 mg by mouth every 6 (six) hours as needed for pain.     Historical Provider, MD  HYDROmorphone (DILAUDID) 2 MG tablet Take 1 tablet (2 mg total) by mouth every 3 (three) hours as needed for severe pain. 04/16/14   Dorothea Ogle, MD  iron polysaccharides (NIFEREX) 150 MG capsule Take 1 capsule daily. 09/17/13   Amy S Esterwood, PA-C  LORazepam (ATIVAN) 1 MG tablet Take 1 tablet (1 mg total) by mouth 3 (three) times daily as needed for anxiety. 04/16/14   Dorothea Ogle, MD  predniSONE (DELTASONE) 5 MG tablet Take 50 mg tablet 2/10 and taper down by 5 mg daily until completed 04/16/14   Dorothea Ogle, MD   BP 139/94 mmHg  Pulse 88  Temp(Src) 98.7 F (37.1 C) (Oral)  Resp 21  Ht  (1.905 m)  Wt 161 lb (73.029 kg)  BMI 20.12 kg/m2  SpO2 100% Physical Exam  Constitutional: He is oriented to person, place, and time. He appears well-developed and well-nourished. No distress.  HENT:  Head: Normocephalic and atraumatic.  Mouth/Throat: Mucous membranes are dry. No oropharyngeal exudate.  Eyes: EOM are normal. Pupils are equal, round, and reactive to light.  Neck: Normal range of motion. Neck supple.  Cardiovascular: Normal rate and regular rhythm.  Exam reveals no friction rub.   No murmur heard. Pulmonary/Chest: Effort normal and breath  sounds normal. No respiratory distress. He has no wheezes. He has no rales.  Abdominal: Soft. He exhibits no distension. There is no tenderness. There is no rebound.  Musculoskeletal: Normal range of motion. He exhibits no edema.  Neurological: He is alert and oriented to person, place, and time.  Skin: No rash noted. He is not diaphoretic.  Nursing note and vitals reviewed.   ED Course  Procedures (including critical care time) Labs Review Labs Reviewed  CBC  COMPREHENSIVE METABOLIC PANEL  LIPASE, BLOOD  I-STAT CG4 LACTIC ACID, ED    Imaging Review No results found.   EKG Interpretation   Date/Time:  Friday  April 19 2014 11:46:38 EST Ventricular Rate:  91 PR Interval:  133 QRS Duration: 90 QT Interval:  372 QTC Calculation: 458 R Axis:   127 Text Interpretation:  Right and left arm electrode reversal,  interpretation assumes no reversal Sinus rhythm Right axis deviation  Abnormal T, consider ischemia, lateral leads Similar to prior Confirmed by  Gwendolyn Grant  MD, Viviene Thurston ([id number]) on 04/19/2014 3:15:52 PM      MDM   Final diagnoses:  Syncope, unspecified syncope type    49M presents with syncope. Was at his PCP and received clonidine for mild HTN (SBP in the 160s) and tachycardia (110s). He then passed out while going to get a drink out of water. Hx of Crohn's, he was recently admitted, discharged 2 days ago after a 1 week stay. He reports persistent orange diarrhea and polyuria. Will check labs and hydrate.  Labs all ok. Patient stable for discharge. Likely had syncope due to getting the clonidine. Stable for discharge.  Elwin Mocha, MD 04/19/14 [id number]

## 2014-04-19 NOTE — Progress Notes (Signed)
   Subjective:    Patient ID: Troy Ramirez, male    DOB: 29-Apr-1985, 29 y.o.   MRN: [id number] CC: establish care, HFU for Crohn's disease  HPI 29 yo M HFU for Crohn's disease from 2/4-2/10  1. Crohn's disease: dx in 2005. Treated with imuran, remicade IV infusions (6th infusion he had an allergic reaction with angioedema), humira (shots hurt, expensive, $8,0000), simzia, methotrexate with mesalamine and prednisone. Has tried to taper in past at 10 mg he felt the flare coming back up.  Associated symptoms include joint pain: low back, hips, and knees.  He has rectal bleeding and diarrhea when Croh's flares up. He denies fever, GI bleed and significant abdominal pain now. He does not have a GI doctor currently.   Most recent GI doctor: [place], Dr. Marina Goodell. Also went to [facility] to see a specialist.   Soc Hx: chronic non smoekr  Med Hx: Crohn's disease  Surg Hx: negative  Review of Systems As per HPI  Substernal chest pains nausea    Objective:   Physical Exam BP 160/110 mmHg  Pulse 112  Temp(Src) 98.2 F (36.8 C) (Oral)  Resp 16  Ht  (1.905 m)  Wt 161 lb (73.029 kg)  BMI 20.12 kg/m2  SpO2 98%  BP Readings from Last 3 Encounters:  04/19/14 160/110  04/16/14 146/82  09/17/13 110/84   Wt Readings from Last 3 Encounters:  04/19/14 161 lb (73.029 kg)  04/15/14 168 lb 11.2 oz (76.522 kg)  09/17/13 180 lb (81.647 kg)  General appearance: alert, cooperative and no distress , thin adult male. Tearful during the exam. Throat: lips, mucosa, and tongue normal; teeth and gums normal Lungs: clear to auscultation bilaterally Heart: S1, S2 normal, regular rhythm, elevated HR Abdomen: soft, non-tender; bowel sounds normal; no masses,  no organomegaly Extremities: extremities normal, atraumatic, no cyanosis or edema  Rectal: normal tone, no lesions , FOBT: positive   Patient HTN and tachycardic: treated with clonidine 0.2 mg PO x one at beginning of interview  Repeat BP 143/84 HR  85  Patient had a syncopal episode while walking out of the room to get water. He feel down to his knees. No head trauma or LOC. He admitted to taking dilaudid 1 mg around 8 AM and ativan 1 mg while in the office (after the interview and physical without asking/informing me ahead of time and while I was out of the room).   Repeat vitals:normal BP, HR 112 IV and IV fluids started zofran 8 mg PO given x 1 Patient transferred to ED.      Assessment & Plan:

## 2014-04-19 NOTE — ED Notes (Signed)
Bed: VE93 Expected date:  Expected time:  Means of arrival:  Comments: EMS_chron's

## 2014-04-19 NOTE — Progress Notes (Signed)
HFU Pt has a history of anemia. Pt has questions about medications. Pt reports that he has lost a lot of weight. Pt states that he needs help.

## 2014-04-19 NOTE — Discharge Instructions (Addendum)
Follow up at [facility] on [address].  [phone]  Walk in hours from 8 am to 3 pm M-Friday, 1st come,1st serve. Also recommended: Hospice grief counseling--[phone].   Emergency Department Resource Guide 1) Find a Doctor and Pay Out of Pocket Although you won't have to find out who is covered by your insurance plan, it is a good idea to ask around and get recommendations. You will then need to call the office and see if the doctor you have chosen will accept you as a new patient and what types of options they offer for patients who are self-pay. Some doctors offer discounts or will set up payment plans for their patients who do not have insurance, but you will need to ask so you aren't surprised when you get to your appointment.  2) Contact Your Local Health Department Not all health departments have doctors that can see patients for sick visits, but many do, so it is worth a call to see if yours does. If you don't know where your local health department is, you can check in your phone book. The CDC also has a tool to help you locate your state's health department, and many state websites also have listings of all of their local health departments.  3) Find a Walk-in Clinic If your illness is not likely to be very severe or complicated, you may want to try a walk in clinic. These are popping up all over the country in pharmacies, drugstores, and shopping centers. They're usually staffed by nurse practitioners or physician assistants that have been trained to treat common illnesses and complaints. They're usually fairly quick and inexpensive. However, if you have serious medical issues or chronic medical problems, these are probably not your best option.  No Primary Care Doctor: - Call Health Connect at  [phone] - they can help you locate a primary care doctor that  accepts your insurance, provides certain services, etc. - Physician Referral Service- [phone]  Chronic Pain  Problems: Organization         Address  Phone   Notes  [facility]  [phone] Patients need to be referred by their primary care doctor.   Medication Assistance: Organization         Address  Phone   Notes  [facility] Medication [facility] [address] [place] [place], [place] [address] [phone] --Must be a resident of [facility] -- Must have NO insurance coverage whatsoever (no Medicaid/ Medicare, etc.) -- The pt. MUST have a primary care doctor that directs their care regularly and follows them in the community   [facility]ist  [phone]   [employer]  [phone]    Agencies that provide inexpensive medical care: Organization         Address  Phone   Notes  [facility]  [phone]   [facility]    [phone]   [facility] [address] [place], [place] [address] [phone]   [facility] [address] [place] [place] [phone]   Planned Parenthood    [phone]   [facility]    [phone]   [facility]  [address], [place] Phone:  [phone], Fax:  [phone] Hours of Operation:  9 am - 6 pm, M-F.  Also accepts Medicaid/Medicare and self-pay.  [facility] for Children  [address], [place] Phone: [phone], Fax: [phone]. Hours of Operation:  8:30 am - 5:30 pm, M-F.  Also accepts Medicaid and self-pay.  [facility] [address], [place] Phone: [phone]   [place], [place], [place] [phone], Ext. [phone] Mondays & Thursdays: 7-9 AM.  First 15 patients are seen on a first come, first serve basis.    [place] Providers:  Organization         Address  Phone   Notes  [facility] [address], [place] [phone] Also  accepts self-pay patients.  [facility] [address], [place]  [phone]   [place], [address], [place] [phone]   [facility] [address] [place] [phone]   Lucianne Lei [address], [place]   [phone] Only accepts [place] Access [place] patients after they have their name applied to their card.   Self-Pay (no insurance) in [facility]:  Organization         Address  Phone   Notes  Sickle Cell Patients, [facility] [employer] [phone]   [facility]gent Care [place] [phone]   [facility] [place]  [place], [place], [place] [phone]   [facility]/Dr. Osei-Bonsu  [address] [place] or [address], [place] [phone] Phone number for both [place] and [place] locations is the same.  [facility] [address], [place] [phone]   [facility] [address] [place] or [address] [phone]   [facility] [address] [place] [phone], phone; [phone], fax Sees patients 1st and 3rd Saturday of every month.  Must not qualify for public or private insurance (i.e. Medicaid, Medicare, [place] Health Choice, Veterans' Benefits)  Household income should be no more than 200% of the poverty level The clinic cannot treat you if you are pregnant or think you are pregnant  Sexually transmitted diseases are not treated at the clinic.    Dental Care: Organization         Address  Phone  Notes  [facility] Department of [facility] [place] [phone] Accepts children up to age 58 who are enrolled in [place] or [place]; pregnant  women with a Medicaid card; and children who have applied for Medicaid or [place] Health Choice, but were declined, whose parents can pay a reduced fee at time of service.  [facility] Department of [facility]  [address], [place] [phone] Accepts children up to age 52 who are enrolled in [place] or [place]; pregnant women with a Medicaid card; and children who have applied for Medicaid or [place] Health Choice, but were declined, whose parents can pay a reduced fee at time of service.  [facility] PRO[facility]  [place] [phone] Patients are seen by appointment only. Walk-ins are not accepted. [place] will see patients 8 years of age and older. Monday - Tuesday (8am-5pm) Most Wednesdays (8:30-5pm) $30 per visit, cash only  [place]  PROGRAM  [address], [facility] [phone] Patients are seen by appointment only. Walk-ins are not accepted. [place] will see patients 73 years of age and older. One Wednesday Evening (Monthly: Volunteer Based).  $30 per visit, cash only  [place]  [phone] for adults; Children under age 67, call Graduate Pediatric Dentistry at [phone]. Children aged 69-14, please call [phone] to request a pediatric application.  Dental services are provided in all areas of dental care including fillings, crowns and bridges, complete and partial dentures, implants, gum treatment, root canals, and extractions. Preventive care is also provided. Treatment is provided to both adults and children. Patients are selected via a lottery and there is often a waiting list.   [facility] [address], [place]  [phone] [email or web address]   [facility] [address] [place], [place] [phone], Ext. [phone] Second and Fourth Thursday of each month, opens at 6:30 AM; Clinic ends at 9 AM.  Patients are  seen on a first-come first-served basis, and a limited number are seen during each clinic.   [facility]  [address] [place], [place] [phone]   Eligibility Requirements You must have lived in [place], [place], or [place] counties for at least the last three months.   You cannot be eligible for state or federal sponsored [employer], including [employer], [place], or [employer].   You generally cannot be eligible for healthcare insurance through your employer.    How to apply: Eligibility screenings are held every Tuesday and Wednesday afternoon from 1:00 pm until 4:00 pm. You do not need an appointment for the interview!  [facility] [address], [place], [place]   [place] Department  [place]  [phone]    Behavioral Health Resources in the Community: Intensive Outpatient Programs Organization         Address  Phone  Notes  [place]. [address], [place], [place] [phone]   [facility] Outpatient [address] [place], [place]   ADS: [facility] Drug S[facility] [address], [place], [place]   [place] [address]  [place], [place] or [phone]   Substance Abuse Resources Organization         Address  Phone  Notes  Al[facility] and Drug Services  [phone]   [place]  [phone]   [facility] [place]   Chinita Pester  [phone]   Residential & Outpatient Substance Abuse Program  [phone]   Psychological Services Organization         Address  Phone  Notes  [facility] [place]  [place]  [phone]   [place] [address], [place] or [phone]    Mobile Crisis  Teams Organization         Address  Phone  Notes  [facility], Mobile Crisis Care Unit  [phone]   [facility]  [address] [place], [place]   [place] [address], [place] [phone]    Self-Help/Support Groups Organization         Address  Phone             Notes  [place]  of [place] - variety of support groups  [place] Call for more information  Narcotics Anonymous (NA), [facility] [address], [employer] [place]  2 meetings at this location   Special educational needs teacher         Address  Phone  Notes  [facility] [place],    [place]  [phone]   [facility]  [address], [place] [address]4008, [place], [place]   [place], [place] [phone] Admissions: 8am-3pm M-F  Incentives Substance [place] [address],    [place], [place] [phone]   [facility] [address] [place], [place], [place]   [facility] [address]  [place], [place]   [facility] Programs - Intensive Outpatient [address], [place], [place]   [facility] ([place].,  [place], [place] [phone] or [phone]   [facility] Services (RTS) [address], [place], [place] Accepts Medicaid  [facility] [place] [address],  [place] [phone] Substance Abuse/Addiction Treatment   [facility] Organization         Address  Phone  Notes  [facility]  [phone]   Domenic Schwab, PhD [address] [place], [place]   [phone] or [phone]   [facility], [place] [phone]   [facility] [address], [place], [place] [phone]  Insurance/Medicaid/sponsorship through [facility] and Families [address]                                    [place], [place] [phone] [place] [address], [place] [phone]    Dr. Adele Schilder  [phone]   [facility] [place] Dept. 1) [address]  2) [place] 3)  [place] [address], [place] [phone]  [phone]   [place] [phone] or [phone] (After Hours)

## 2014-04-20 DIAGNOSIS — R55 Syncope and collapse: Secondary | ICD-10-CM | POA: Insufficient documentation

## 2014-04-20 NOTE — Assessment & Plan Note (Signed)
A; chronic Crohn's  P:  Continue prednisone, taper down to 10 mg  GI referral

## 2014-04-20 NOTE — Assessment & Plan Note (Signed)
>>  ASSESSMENT AND PLAN FOR CROHN'S DISEASE WRITTEN ON 04/20/2014  3:17 PM BY Dessa Phi C, MD  A; chronic Crohn's  P:  Continue prednisone, taper down to 10 mg  GI referral

## 2014-04-20 NOTE — Assessment & Plan Note (Signed)
A: syncope in the setting of chronic GI bleed due to Crohn's disease and medication side effect (dilaudid, ativan, clonidine), limited PO intake.  P: Transfer to ED for stat labs including CBC F/u after ED evaluation and treatment

## 2014-04-29 ENCOUNTER — Encounter: Payer: Self-pay | Admitting: Physician Assistant

## 2014-04-29 ENCOUNTER — Ambulatory Visit (INDEPENDENT_AMBULATORY_CARE_PROVIDER_SITE_OTHER): Payer: 59 | Admitting: Physician Assistant

## 2014-04-29 ENCOUNTER — Inpatient Hospital Stay (HOSPITAL_COMMUNITY): Payer: 59

## 2014-04-29 ENCOUNTER — Inpatient Hospital Stay (HOSPITAL_COMMUNITY)
Admission: AD | Admit: 2014-04-29 | Discharge: 2014-05-08 | DRG: 386 | Disposition: A | Discharge status: Home / Self Care | Payer: 59 | Source: Ambulatory Visit | Attending: Family Medicine | Admitting: Family Medicine

## 2014-04-29 ENCOUNTER — Encounter (HOSPITAL_COMMUNITY): Payer: Self-pay | Admitting: Internal Medicine

## 2014-04-29 VITALS — BP 122/84 | HR 100 | Ht 75.0 in | Wt 155.4 lb

## 2014-04-29 DIAGNOSIS — K50114 Crohn's disease of large intestine with abscess: Secondary | ICD-10-CM | POA: Insufficient documentation

## 2014-04-29 DIAGNOSIS — G8929 Other chronic pain: Secondary | ICD-10-CM | POA: Diagnosis present

## 2014-04-29 DIAGNOSIS — Z9114 Patient's other noncompliance with medication regimen: Secondary | ICD-10-CM | POA: Insufficient documentation

## 2014-04-29 DIAGNOSIS — F419 Anxiety disorder, unspecified: Secondary | ICD-10-CM | POA: Diagnosis present

## 2014-04-29 DIAGNOSIS — M1990 Unspecified osteoarthritis, unspecified site: Secondary | ICD-10-CM

## 2014-04-29 DIAGNOSIS — D696 Thrombocytopenia, unspecified: Secondary | ICD-10-CM | POA: Diagnosis present

## 2014-04-29 DIAGNOSIS — F1290 Cannabis use, unspecified, uncomplicated: Secondary | ICD-10-CM | POA: Diagnosis present

## 2014-04-29 DIAGNOSIS — Z681 Body mass index (BMI) 19 or less, adult: Secondary | ICD-10-CM

## 2014-04-29 DIAGNOSIS — Z599 Problem related to housing and economic circumstances, unspecified: Secondary | ICD-10-CM

## 2014-04-29 DIAGNOSIS — F329 Major depressive disorder, single episode, unspecified: Secondary | ICD-10-CM | POA: Diagnosis present

## 2014-04-29 DIAGNOSIS — R109 Unspecified abdominal pain: Secondary | ICD-10-CM | POA: Diagnosis present

## 2014-04-29 DIAGNOSIS — R634 Abnormal weight loss: Secondary | ICD-10-CM

## 2014-04-29 DIAGNOSIS — F32A Depression, unspecified: Secondary | ICD-10-CM | POA: Diagnosis present

## 2014-04-29 DIAGNOSIS — I10 Essential (primary) hypertension: Secondary | ICD-10-CM | POA: Diagnosis present

## 2014-04-29 DIAGNOSIS — K50819 Crohn's disease of both small and large intestine with unspecified complications: Secondary | ICD-10-CM | POA: Diagnosis present

## 2014-04-29 DIAGNOSIS — K5090 Crohn's disease, unspecified, without complications: Secondary | ICD-10-CM | POA: Diagnosis present

## 2014-04-29 DIAGNOSIS — Z888 Allergy status to other drugs, medicaments and biological substances status: Secondary | ICD-10-CM | POA: Diagnosis not present

## 2014-04-29 DIAGNOSIS — K50119 Crohn's disease of large intestine with unspecified complications: Secondary | ICD-10-CM

## 2014-04-29 DIAGNOSIS — E440 Moderate protein-calorie malnutrition: Secondary | ICD-10-CM | POA: Diagnosis present

## 2014-04-29 DIAGNOSIS — K50811 Crohn's disease of both small and large intestine with rectal bleeding: Secondary | ICD-10-CM | POA: Diagnosis present

## 2014-04-29 DIAGNOSIS — T380X5A Adverse effect of glucocorticoids and synthetic analogues, initial encounter: Secondary | ICD-10-CM | POA: Diagnosis present

## 2014-04-29 DIAGNOSIS — D72829 Elevated white blood cell count, unspecified: Secondary | ICD-10-CM | POA: Diagnosis present

## 2014-04-29 DIAGNOSIS — D509 Iron deficiency anemia, unspecified: Secondary | ICD-10-CM | POA: Diagnosis present

## 2014-04-29 DIAGNOSIS — K921 Melena: Secondary | ICD-10-CM

## 2014-04-29 DIAGNOSIS — K5010 Crohn's disease of large intestine without complications: Secondary | ICD-10-CM | POA: Diagnosis present

## 2014-04-29 DIAGNOSIS — K5080 Crohn's disease of both small and large intestine without complications: Secondary | ICD-10-CM

## 2014-04-29 LAB — COMPREHENSIVE METABOLIC PANEL
ALBUMIN: 4 g/dL (ref 3.5–5.2)
ALK PHOS: 38 U/L — AB (ref 39–117)
ALT: 14 U/L (ref 0–53)
ANION GAP: 9 (ref 5–15)
AST: 18 U/L (ref 0–37)
BILIRUBIN TOTAL: 1 mg/dL (ref 0.3–1.2)
BUN: 10 mg/dL (ref 6–23)
CO2: 25 mmol/L (ref 19–32)
Calcium: 9.7 mg/dL (ref 8.4–10.5)
Chloride: 106 mmol/L (ref 96–112)
Creatinine, Ser: 0.77 mg/dL (ref 0.50–1.35)
GFR calc Af Amer: >90 mL/min (ref >90)
GFR calc non Af Amer: >90 mL/min (ref >90)
Glucose, Bld: 99 mg/dL (ref 70–99)
Potassium: 3.6 mmol/L (ref 3.5–5.1)
SODIUM: 140 mmol/L (ref 135–145)
Total Protein: 8.2 g/dL (ref 6.0–8.3)

## 2014-04-29 LAB — CBC
HCT: 35.4 % — ABNORMAL LOW (ref 39.0–52.0)
Hemoglobin: 11.2 g/dL — ABNORMAL LOW (ref 13.0–17.0)
MCH: 22.5 pg — ABNORMAL LOW (ref 26.0–34.0)
MCHC: 31.6 g/dL (ref 30.0–36.0)
MCV: 71.2 fL — ABNORMAL LOW (ref 78.0–100.0)
PLATELETS: 492 10*3/uL — AB (ref 150–400)
RBC: 4.97 MIL/uL (ref 4.22–5.81)
RDW: 14.6 % (ref 11.5–15.5)
WBC: 16 10*3/uL — AB (ref 4.0–10.5)

## 2014-04-29 LAB — SEDIMENTATION RATE: Sed Rate: 50 mm/hr — ABNORMAL HIGH (ref 0–16)

## 2014-04-29 LAB — RAPID URINE DRUG SCREEN, HOSP PERFORMED
AMPHETAMINES: NOT DETECTED
BARBITURATES: NOT DETECTED
BENZODIAZEPINES: POSITIVE — AB
Cocaine: NOT DETECTED
OPIATES: POSITIVE — AB
Tetrahydrocannabinol: POSITIVE — AB

## 2014-04-29 LAB — VITAMIN B12: Vitamin B-12: 628 pg/mL (ref 211–911)

## 2014-04-29 MED ORDER — ONDANSETRON HCL 4 MG PO TABS: 4.0000 mg | ORAL_TABLET | Freq: Four times a day (QID) | ORAL | Status: DC | PRN

## 2014-04-29 MED ORDER — ONDANSETRON HCL 4 MG/2ML IJ SOLN
4.0000 mg | Freq: Four times a day (QID) | INTRAMUSCULAR | Status: DC | PRN
  Administered 2014-04-30 – 2014-05-03 (×3): 4 mg via INTRAVENOUS
  Filled 2014-04-29 (×3): qty 2

## 2014-04-29 MED ORDER — BOOST / RESOURCE BREEZE PO LIQD
1.0000 | Freq: Three times a day (TID) | ORAL | Status: DC
  Administered 2014-04-29 – 2014-05-08 (×19): 1 via ORAL
  Filled 2014-04-29: qty 1

## 2014-04-29 MED ORDER — METHYLPREDNISOLONE SODIUM SUCC 125 MG IJ SOLR
125.0000 mg | Freq: Two times a day (BID) | INTRAMUSCULAR | Status: DC
  Administered 2014-04-29 – 2014-05-01 (×4): 125 mg via INTRAVENOUS
  Filled 2014-04-29 (×6): qty 2

## 2014-04-29 MED ORDER — HYDROMORPHONE HCL 2 MG PO TABS
2.0000 mg | ORAL_TABLET | ORAL | Status: DC | PRN
  Administered 2014-04-29 – 2014-04-30 (×5): 2 mg via ORAL
  Filled 2014-04-29 (×6): qty 1

## 2014-04-29 MED ORDER — MESALAMINE 1.2 G PO TBEC
4.8000 g | DELAYED_RELEASE_TABLET | Freq: Every day | ORAL | Status: DC
  Administered 2014-04-29 – 2014-05-01 (×3): 4.8 g via ORAL
  Filled 2014-04-29 (×4): qty 4

## 2014-04-29 MED ORDER — SODIUM CHLORIDE 0.9 % IV SOLN
INTRAVENOUS | Status: AC
  Administered 2014-04-29 – 2014-04-30 (×2): via INTRAVENOUS

## 2014-04-29 MED ORDER — LORAZEPAM 1 MG PO TABS
1.0000 mg | ORAL_TABLET | Freq: Three times a day (TID) | ORAL | Status: DC | PRN
  Administered 2014-04-29 – 2014-05-08 (×20): 1 mg via ORAL
  Filled 2014-04-29 (×21): qty 1

## 2014-04-29 NOTE — Progress Notes (Signed)
Pt arrived unit as a direct admit. MD paged and notified of Pt's location, will continue to monitor.

## 2014-04-29 NOTE — Progress Notes (Signed)
Extremely complicated IBD patient with multiple issues as outlined. He has seen multiple gastroenterologists in multiple locations. We have tried to provide his care in [place] at his request, but he has demonstrated noncompliance on a number of occasions. In any event, being admitted for refractory pain on the background of Crohn's disease. Psych issues as outlined. Long-term he needs tertiary care IBD center. We have suggested this strongly before. We are happy to get him reestablished at [facility]

## 2014-04-29 NOTE — H&P (Addendum)
[facility] History and Physical  Troy Ramirez WUJ:[id number] DOB: 1985-07-08 DOA: 04/29/2014  Referring physician: Dr. Marina Goodell PCP: Lora Paula, MD   Chief Complaint: abd pain and diarhea  HPI: Troy Ramirez is a 29 y.o. male past medical history of Crohn's disease recently discharged from the hospital for Crohn's exacerbation on 04/16/2014 on a steroid taper, he was tapered pretty quickly he went to his GI doctor complaining of lower extremity weakness abdominal pain and diarrhea bloody diarrhea so he was directly admitted to [facility]: [place] Rhonda W. He relates that since he was discharged she is at ongoing diarrhea, when he finishes steroid treatment he started having more perfuse diarrhea with some blood on it.  He's also complaining of full body tingling and somebody grabbing onto his ankles. He relates pain diffusely throughout his body nothing makes it better or worse.  Review of Systems:  Constitutional:  No weight loss, night sweats, Fevers, chills, fatigue.  HEENT:  No headaches, Difficulty swallowing,Tooth/dental problems,Sore throat,  No sneezing, itching, ear ache, nasal congestion, post nasal drip,  Cardio-vascular:  No chest pain, Orthopnea, PND, swelling in lower extremities, anasarca, dizziness, palpitations  GI:  No heartburn, indigestion, abdominal pain, nausea, vomiting, diarrhea, change in bowel habits, loss of appetite  Resp:  No shortness of breath with exertion or at rest. No excess mucus, no productive cough, No non-productive cough, No coughing up of blood.No change in color of mucus.No wheezing.No chest wall deformity  Skin:  no rash or lesions.  GU:  no dysuria, change in color of urine, no urgency or frequency. No flank pain.  Musculoskeletal:  No joint pain or swelling. No decreased range of motion. No back pain.  Psych:  No change in mood or affect. No depression or anxiety. No memory loss.   Past Medical History  Diagnosis Date  . Crohn  disease   . Anemia     required transfusion of bood in 2008 or 2009  . 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   . Internal hemorrhoids    Past Surgical History  Procedure Laterality Date  . Neg hx     Social History:  reports that he has never smoked. He has never used smokeless tobacco. He reports that he drinks alcohol. He reports that he uses illicit drugs (Marijuana).  Allergies  Allergen Reactions  . Humira [Adalimumab] Anaphylaxis  . Remicade [Infliximab] Anaphylaxis and Swelling    Family History  Problem Relation Age of Onset  . Diabetes      Both sides  . Heart disease      both sides  . Breast cancer Maternal Grandmother   . Prostate cancer Paternal Grandfather   . Colon cancer Neg Hx   . Diabetes Mellitus II Mother   . Hypertension Father   . Diabetes Mellitus II Father     Prior to Admission medications   Medication Sig Start Date End Date Taking? Authorizing Provider  acetaminophen (TYLENOL) 500 MG tablet Take 500-1,000 mg by mouth every 6 (six) hours as needed for pain.     Historical Provider, MD  HYDROmorphone (DILAUDID) 2 MG tablet Take 1 tablet (2 mg total) by mouth every 3 (three) hours as needed for severe pain. 04/16/14   Dorothea Ogle, MD  iron polysaccharides (NIFEREX) 150 MG capsule Take 1 capsule daily. Patient not taking: Reported on 04/29/2014 09/17/13   Amy S Esterwood, PA-C  LORazepam (ATIVAN) 1 MG tablet Take 1  tablet (1 mg total) by mouth 3 (three) times daily as needed for anxiety. 04/16/14   Dorothea Ogle, MD  predniSONE (DELTASONE) 5 MG tablet Take 50 mg tablet 2/10 and taper down by 5 mg daily until completed 04/16/14   Dorothea Ogle, MD   Physical Exam: Filed Vitals:   04/29/14 1149 04/29/14 1233  BP: 118/70   Pulse: 125   Temp:  98.5 F (36.9 C)  TempSrc:  Axillary  Resp: 36   SpO2: 100%     Wt Readings from Last 3 Encounters:  04/29/14 70.489 kg (155 lb 6.4 oz)  04/19/14 73.029 kg (161  lb)  04/19/14 73.029 kg (161 lb)    General:  Appears calm and comfortable, cachectic. Eyes: PERRL, normal lids, irises & conjunctiva. ENT: grossly normal hearing, lips & tongue. Neck: no LAD, masses or thyromegaly. Cardiovascular: RRR, no m/r/g. No LE edema. Telemetry: SR, no arrhythmias  Respiratory: CTA bilaterally, no w/r/r. Normal respiratory effort. Abdomen: soft, ntnd Skin: no rash or induration seen on limited exam Musculoskeletal: grossly normal tone BUE/BLE Psychiatric: grossly normal mood and affect, speech fluent and appropriate Neurologic: 3-12 are intact sensation is intact throughout, reflexes are 2+ symmetrically in the lower extremities, he relates some tingling also in his lower extremities and like somebody grabbing his leg. But there is no spasm or fasciculations.           Labs on Admission:  Basic Metabolic Panel: No results for input(s): NA, K, CL, CO2, GLUCOSE, BUN, CREATININE, CALCIUM, MG, PHOS in the last 168 hours. Liver Function Tests: No results for input(s): AST, ALT, ALKPHOS, BILITOT, PROT, ALBUMIN in the last 168 hours. No results for input(s): LIPASE, AMYLASE in the last 168 hours. No results for input(s): AMMONIA in the last 168 hours. CBC: No results for input(s): WBC, NEUTROABS, HGB, HCT, MCV, PLT in the last 168 hours. Cardiac Enzymes: No results for input(s): CKTOTAL, CKMB, CKMBINDEX, TROPONINI in the last 168 hours.  BNP (last 3 results) No results for input(s): BNP in the last 8760 hours.  ProBNP (last 3 results) No results for input(s): PROBNP in the last 8760 hours.  CBG: No results for input(s): GLUCAP in the last 168 hours.  Radiological Exams on Admission: No results found.  EKG: Independently reviewed.   Assessment/Plan Crohn's disease/ Abdominal pain/Exacerbation of Crohn's disease wit rectal bleeding: - I will go ahead and start him on IV Solu-Medrol and Lialda. - From previous note he has been hesitant to be in any  regimen in the past. So there is a question of noncompliance. - I will not give him any additional narcotics or Ativan. He will restart restarted on his home regimen. - We'll document bowel movements. - Continue clear liquid diet. - Avoid heparin use SCDs. - Over that his other neurological complaints   Chronic abdominal pain: - No changes made to his home regimen. - he is not nauseated or vomiting, will cont oral narcotics.  Malnutrition of moderate degree - Start him on ensure 3 times a day   Code Status: full DVT Prophylaxis:SCD's Family Communication: none Disposition Plan: inpatient  Time spent: 70 min  Marinda Elk [facility] Pager [phone]

## 2014-04-29 NOTE — Progress Notes (Signed)
Patient ID: Troy Ramirez, male   DOB: 25-Feb-1986, 29 y.o.   MRN: [id number]   Subjective:    Patient ID: Troy Ramirez, male    DOB: 10/29/1985, 29 y.o.   MRN: [id number]  HPI Troy Ramirez is a 29 year old white male known to Dr. Yancey Ramirez with history of Crohn's ileo-colitis. He was initially diagnosed in 2006 and has been seen by several gastroenterologists over the years. He had been seen by Dr. Dione Ramirez at [facility] and was cared for there prior to establishing with Dr. Marina Ramirez. He had hospitalization in 2015 with an exacerbation of his Crohn's colitis. 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 He is also had prior issues with narcotic addiction. Been on methadone at one point which apparently worked well but he stopped this in the fall of 2014. He has not been to our office since July 2015. Patient has been through several medication regimens including infliximab to which she had an infusion reaction. He took Humira for short period of time and now says that his insurance would not cover it so he had to stop it. He also took systems he is short term and apparently tolerated this but says that his insurance company denied payment afford after the initial induction phase. He had been started on methotrexate in 2013 per [place]  which he says helped  his arthralgias quite a bit but now he says made him nauseated. When last seen in July he will was to remain on methotrexate weekly Lialda 4.8 g daily and was on a slow steroid taper. At this time he says he has stopped all of those medications for various reasons. He says the methotrexate made him so nauseated he had smoked marijuana to counteract this. He has had a difficult couple of months. He was hospitalized briefly in early February on the hospitalist service with a Crohn's exacerbation and was treated short-term steroids but discharged only with a couple of days worth of prednisone. He says he has been fired from his job  and will lose his insurance at the beginning of March. He is frustrated with life in general at this point says he is hurting all over, primarily in his lower back and both of his knees areas ongoing abdominal pain and cramping and is having 7-8 diarrheal stools per day occasionally with small amounts of blood. His appetite is poor and his weight is down about 20 pounds since his last office visit here. He says is extremely stressed out and has an appointment with mental health next week. He is also reluctant to take steroids because he says it changes him and he can't be around people when he is on prednisone. He had previously been established with a pain clinic in [place] but says he will not go back there because they insulted him. In addition to abdominal pain and joint pain he says is been feeling short of breath over the past several weeks no cough or sputum production no documented fever or chills.  Review of Systems Pertinent positive and negative review of systems were noted in the above HPI section.  All other review of systems was otherwise negative.  Outpatient Encounter Prescriptions as of 04/29/2014  Medication Sig  . acetaminophen (TYLENOL) 500 MG tablet Take 500-1,000 mg by mouth every 6 (six) hours as needed for pain.   Marland Kitchen HYDROmorphone (DILAUDID) 2 MG tablet Take 1 tablet (2 mg total) by mouth every 3 (three)  hours as needed for severe pain.  Marland Kitchen LORazepam (ATIVAN) 1 MG tablet Take 1 tablet (1 mg total) by mouth 3 (three) times daily as needed for anxiety.  . predniSONE (DELTASONE) 5 MG tablet Take 50 mg tablet 2/10 and taper down by 5 mg daily until completed  . iron polysaccharides (NIFEREX) 150 MG capsule Take 1 capsule daily. (Patient not taking: Reported on 04/29/2014)   Allergies  Allergen Reactions  . Humira [Adalimumab] Anaphylaxis  . Remicade [Infliximab] Anaphylaxis and Swelling   Patient Active Problem List   Diagnosis Date Noted  . Arthritis associated with another  disorder 04/29/2014  . Noncompliance with medication regimen 04/29/2014  . Syncope 04/20/2014  . Crohn's disease 04/19/2014  . Malnutrition of moderate degree 04/13/2014  . Hypokalemia 04/11/2014  . Exacerbation of Crohn's disease 04/11/2014  . Leukocytosis   . Anemia of chronic disease   . Abdominal pain 08/27/2013   History   Social History  . Marital Status: Single    Spouse Name: N/A  . Number of Children: 0  . Years of Education: N/A   Occupational History  . manager    Social History Main Topics  . Smoking status: Never Smoker   . Smokeless tobacco: Never Used  . Alcohol Use: Yes     Comment: rarely  . Drug Use: Yes    Special: Marijuana  . Sexual Activity: Not on file   Other Topics Concern  . Not on file   Social History Narrative    [name] family history includes Breast cancer in his maternal grandmother; Diabetes in an other family member; Heart disease in an other family member; Prostate cancer in his paternal grandfather. There is no history of Colon cancer.      Objective:    Filed Vitals:   04/29/14 0931  BP: 122/84  Pulse: 100    Physical Exam  well-developed thin white male lying on the examining table accompanied by a girlfriend pressure 122/84 pulse 100 height 6 foot 3 weight 155 this is down from 8 weight of 180 pounds in July 2015 and 220 pounds in 2014. HEENT; nontraumatic normocephalic EOMI PERRLA sclera anicteric, Neck ;supple no JVD, Cardiovascular; regular rate and rhythm with S1-S2 slightly tacky pulmonary clear bilaterally, Abdomen; other diffusely tender no guarding or rebound bowel sounds are present there is no palpable mass or hepatosplenomegaly, Recta;exam not done, Extremities; no clubbing cyanosis or edema skin warm and dry, Psych; irritable but appropriate       Assessment & Plan:   #1 29 yo male with Crohns Ileocolitis dx 2006 -with very poorly controlled symptoms after recent hospitalization and very rapid steroid  taper. He has ongoing complaints of abdominal pain cramping diarrhea and lack of appetite in addition to fatigue and shortness of breath. 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 #4 poor tolerance of steroids with mood changes #5 anxiety/depression? Other psychiatric disorder #6 significant weight loss secondary to #1 #7 dyspnea 1 month etiology not clear will need further workup  Plan;Have spoken to the hospitalist service and patient will be admitted to the [facility], GI will consult. Start IV Solumedrol, GI pathogen panel He may need repeat colonoscopy at this time to assess activity of his disease-this has not been done over the past couple of years Discussed with Dr. Yancey Ramirez. Patient will be referred back to [facility]/Dr. Herfarth to the IBD clinic  for management of his complicated disease ., And to help with medication issues given previous reaction to Remicade questionable loss of response to Humira and inability to afford other TNF agents  Patient needs psych consult during this hospitalization as he will need to be on steroids at least in the short-term He will also benefit from establishing with a pain clinic Unfortunately noncompliance has been a major issue with this patient as well.   Ahtziry Saathoff Oswald Hillock PA-C 04/29/2014

## 2014-04-29 NOTE — Progress Notes (Signed)
Pt refusing po meds, MD notified. Will continue to monitor.

## 2014-04-29 NOTE — H&P (Signed)
[facility] History and Physical  Troy Ramirez ZOX:[id number] DOB: May 28, 1985 DOA: 04/29/2014  Referring physician: Dr. Marina Ramirez PCP: Troy Paula, MD   Chief Complaint: abd pain and diarhea  HPI: Troy Ramirez is a 29 y.o. male past medical history of Crohn's disease recently discharged from the hospital for Crohn's exacerbation on 04/16/2014 on a steroid taper, he was tapered pretty quickly he went to his GI doctor complaining of lower extremity weakness abdominal pain and diarrhea bloody diarrhea so he was directly admitted to [facility]. He relates that since he was discharged she is at ongoing diarrhea, when he finishes steroid treatment he started having more perfuse diarrhea with some blood on it.  He's also complaining of full body tingling and somebody grabbing onto his ankles. He relates pain diffusely throughout his body nothing makes it better or worse.  Review of Systems:  Constitutional:  No weight loss, night sweats, Fevers, chills, fatigue.  HEENT:  No headaches, Difficulty swallowing,Tooth/dental problems,Sore throat,  No sneezing, itching, ear ache, nasal congestion, post nasal drip,  Cardio-vascular:  No chest pain, Orthopnea, PND, swelling in lower extremities, anasarca, dizziness, palpitations  GI:  No heartburn, indigestion, abdominal pain, nausea, vomiting, diarrhea, change in bowel habits, loss of appetite  Resp:  No shortness of breath with exertion or at rest. No excess mucus, no productive cough, No non-productive cough, No coughing up of blood.No change in color of mucus.No wheezing.No chest wall deformity  Skin:  no rash or lesions.  GU:  no dysuria, change in color of urine, no urgency or frequency. No flank pain.  Musculoskeletal:  No joint pain or swelling. No decreased range of motion. No back pain.  Psych:  No change in mood or affect. No depression or anxiety. No memory loss.   Past Medical History  Diagnosis Date  . Crohn  disease   . Anemia     required transfusion of bood in 2008 or 2009  . 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   . Internal hemorrhoids    Past Surgical History  Procedure Laterality Date  . Neg hx     Social History:  reports that he has never smoked. He has never used smokeless tobacco. He reports that he drinks alcohol. He reports that he uses illicit drugs (Marijuana).  Allergies  Allergen Reactions  . Humira [Adalimumab] Anaphylaxis  . Remicade [Infliximab] Anaphylaxis and Swelling    Family History  Problem Relation Age of Onset  . Diabetes      Both sides  . Heart disease      both sides  . Breast cancer Maternal Grandmother   . Prostate cancer Paternal Grandfather   . Colon cancer Neg Hx   . Diabetes Mellitus II Mother   . Hypertension Father   . Diabetes Mellitus II Father     Prior to Admission medications   Medication Sig Start Date End Date Taking? Authorizing Provider  acetaminophen (TYLENOL) 500 MG tablet Take 500-1,000 mg by mouth every 6 (six) hours as needed for pain.     Historical Provider, MD  HYDROmorphone (DILAUDID) 2 MG tablet Take 1 tablet (2 mg total) by mouth every 3 (three) hours as needed for severe pain. 04/16/14   Troy Ogle, MD  iron polysaccharides (NIFEREX) 150 MG capsule Take 1 capsule daily. Patient not taking: Reported on 04/29/2014 09/17/13   Troy S Esterwood, PA-C  LORazepam (ATIVAN) 1 MG tablet Take 1  tablet (1 mg total) by mouth 3 (three) times daily as needed for anxiety. 04/16/14   Troy Ogle, MD  predniSONE (DELTASONE) 5 MG tablet Take 50 mg tablet 2/10 and taper down by 5 mg daily until completed 04/16/14   Troy Ogle, MD   Physical Exam: Filed Vitals:   04/29/14 1149 04/29/14 1233  BP: 118/70   Pulse: 125   Temp:  98.5 F (36.9 C)  TempSrc:  Axillary  Resp: 36   SpO2: 100%     Wt Readings from Last 3 Encounters:  04/29/14 70.489 kg (155 lb 6.4 oz)  04/19/14 73.029 kg (161  lb)  04/19/14 73.029 kg (161 lb)    General:  Appears calm and comfortable, cachectic. Eyes: PERRL, normal lids, irises & conjunctiva. ENT: grossly normal hearing, lips & tongue. Neck: no LAD, masses or thyromegaly. Cardiovascular: RRR, no m/r/g. No LE edema. Telemetry: SR, no arrhythmias  Respiratory: CTA bilaterally, no w/r/r. Normal respiratory effort. Abdomen: soft, ntnd Skin: no rash or induration seen on limited exam Musculoskeletal: grossly normal tone BUE/BLE Psychiatric: grossly normal mood and affect, speech fluent and appropriate Neurologic: 3-12 are intact sensation is intact throughout, reflexes are 2+ symmetrically in the lower extremities, he relates some tingling also in his lower extremities and like somebody grabbing his leg. But there is no spasm or fasciculations.           Labs on Admission:  Basic Metabolic Panel:  Recent Labs Lab 04/29/14 1400  NA 140  K 3.6  CL 106  CO2 25  GLUCOSE 99  BUN 10  CREATININE 0.77  CALCIUM 9.7   Liver Function Tests:  Recent Labs Lab 04/29/14 1400  AST 18  ALT 14  ALKPHOS 38*  BILITOT 1.0  PROT 8.2  ALBUMIN 4.0   No results for input(s): LIPASE, AMYLASE in the last 168 hours. No results for input(s): AMMONIA in the last 168 hours. CBC:  Recent Labs Lab 04/29/14 1400  WBC 16.0*  HGB 11.2*  HCT 35.4*  MCV 71.2*  PLT 492*   Cardiac Enzymes: No results for input(s): CKTOTAL, CKMB, CKMBINDEX, TROPONINI in the last 168 hours.  BNP (last 3 results) No results for input(s): BNP in the last 8760 hours.  ProBNP (last 3 results) No results for input(s): PROBNP in the last 8760 hours.  CBG: No results for input(s): GLUCAP in the last 168 hours.  Radiological Exams on Admission: No results found.  EKG: Independently reviewed.   Assessment/Plan Crohn's disease/ Abdominal pain/Exacerbation of Crohn's disease wit rectal bleeding: - I will go ahead and start him on IV Solu-Medrol and Lialda. Check Ct abd  and pelvis. - From previous note he has been hesitant to be in any regimen in the past. So there is a question of noncompliance. - I will not give him any additional narcotics or Ativan. We will restart restarted on his home regimen. - We'll document bowel movements. - Continue clear liquid diet. - Avoid heparin use SCDs. - GI pathogen panel.  Chronic abdominal pain: - No changes made to his home regimen. - he is not nauseated or vomiting, will cont oral narcotics.  Malnutrition of moderate degree - Start him on ensure 3 times a day   Code Status: full DVT Prophylaxis:SCD's Family Communication: none Disposition Plan: inpatient  Time spent: 70 min  Marinda Elk [facility] Pager [phone]

## 2014-04-30 ENCOUNTER — Encounter (HOSPITAL_COMMUNITY): Payer: Self-pay | Admitting: Radiology

## 2014-04-30 ENCOUNTER — Inpatient Hospital Stay (HOSPITAL_COMMUNITY): Payer: 59

## 2014-04-30 DIAGNOSIS — R100 Acute abdomen: Secondary | ICD-10-CM

## 2014-04-30 DIAGNOSIS — R109 Unspecified abdominal pain: Secondary | ICD-10-CM | POA: Insufficient documentation

## 2014-04-30 DIAGNOSIS — K50119 Crohn's disease of large intestine with unspecified complications: Secondary | ICD-10-CM

## 2014-04-30 DIAGNOSIS — K50911 Crohn's disease, unspecified, with rectal bleeding: Secondary | ICD-10-CM

## 2014-04-30 DIAGNOSIS — E440 Moderate protein-calorie malnutrition: Secondary | ICD-10-CM

## 2014-04-30 DIAGNOSIS — R1084 Generalized abdominal pain: Secondary | ICD-10-CM

## 2014-04-30 LAB — CBC
HCT: 34.1 % — ABNORMAL LOW (ref 39.0–52.0)
Hemoglobin: 10.4 g/dL — ABNORMAL LOW (ref 13.0–17.0)
MCH: 22 pg — ABNORMAL LOW (ref 26.0–34.0)
MCHC: 30.5 g/dL (ref 30.0–36.0)
MCV: 72.1 fL — AB (ref 78.0–100.0)
Platelets: 473 10*3/uL — ABNORMAL HIGH (ref 150–400)
RBC: 4.73 MIL/uL (ref 4.22–5.81)
RDW: 15.1 % (ref 11.5–15.5)
WBC: 11.3 10*3/uL — ABNORMAL HIGH (ref 4.0–10.5)

## 2014-04-30 LAB — BASIC METABOLIC PANEL
Anion gap: 11 (ref 5–15)
BUN: 9 mg/dL (ref 6–23)
CO2: 22 mmol/L (ref 19–32)
CREATININE: 0.65 mg/dL (ref 0.50–1.35)
Calcium: 9.2 mg/dL (ref 8.4–10.5)
Chloride: 105 mmol/L (ref 96–112)
Glucose, Bld: 129 mg/dL — ABNORMAL HIGH (ref 70–99)
POTASSIUM: 4.1 mmol/L (ref 3.5–5.1)
Sodium: 138 mmol/L (ref 135–145)

## 2014-04-30 MED ORDER — IOHEXOL 300 MG/ML  SOLN
25.0000 mL | INTRAMUSCULAR | Status: AC
  Administered 2014-04-30 (×2): 25 mL via ORAL

## 2014-04-30 MED ORDER — HYDROMORPHONE HCL 2 MG/ML IJ SOLN
2.0000 mg | INTRAMUSCULAR | Status: DC | PRN
  Administered 2014-04-30 – 2014-05-07 (×37): 2 mg via INTRAVENOUS
  Filled 2014-04-30 (×37): qty 1

## 2014-04-30 MED ORDER — IOHEXOL 300 MG/ML  SOLN
100.0000 mL | Freq: Once | INTRAMUSCULAR | Status: AC | PRN
  Administered 2014-04-30: 100 mL via INTRAVENOUS

## 2014-04-30 NOTE — Progress Notes (Signed)
[facility] PROGRESS NOTE  Troy Ramirez ZOX:[id number] DOB: Aug 20, 1985 DOA: 04/29/2014 PCP: Lora Paula, MD  Assessment/Plan: 1. Crohn's disease/abd pain 1. CT with findings of active Crohn's colitis 2. GI following 3. On IV steroids for now, hopefully able to transition to PO abx soon 4. GI recs noted. Pt is being arranged to follow up at [facility] 5. Reports unable to tolerate clears (tray of clears mostly untouched at bedside) 6. Will allow bowel rest for tonight 7. Change PO dilaudid to IV for now as active colitis seen on imaging 2. Chronic abd pain 3. Moderate protein calorie malnutrition 1. Nutrition following 2. Recs for Ensure TID 4. DVT prophylaxis 1. SCD's  Code Status: Full Family Communication: Pt in room, family at bedside Disposition Plan: Pending   Consultants:  GI  Procedures:    Antibiotics:   (indicate start date, and stop date if known)  HPI/Subjective: Multiple complaints and concerns. No acute events noted overnight. Reports limited tolerability to PO intake  Objective: Filed Vitals:   04/29/14 1233 04/29/14 2034 04/30/14 0451 04/30/14 1721  BP:  133/65 133/80 149/84  Pulse:  89 64 90  Temp: 98.5 F (36.9 C) 98.2 F (36.8 C) 97.2 F (36.2 C) 97.9 F (36.6 C)  TempSrc: Axillary Oral Oral Oral  Resp:  SpO2:  99% 100% 100%    Intake/Output Summary (Last 24 hours) at 04/30/14 1834 Last data filed at 04/30/14 1300  Gross per 24 hour  Intake    250 ml  Output      0 ml  Net    250 ml   There were no vitals filed for this visit.  Exam:   General:  Awake, in nad  Cardiovascular: regular,s 1, s2  Respiratory: normal resp effort,no wheezing  Abdomen: soft, nondistended, tender generally to mild palpation  Musculoskeletal: perfused, no clubbing   Data Reviewed: Basic Metabolic Panel:  Recent Labs Lab 04/29/14 1400 04/30/14 0545  NA 140 138  K 3.6 4.1  CL 106 105  CO2 25 22  GLUCOSE 99 129*   BUN 10 9  CREATININE 0.77 0.65  CALCIUM 9.7 9.2   Liver Function Tests:  Recent Labs Lab 04/29/14 1400  AST 18  ALT 14  ALKPHOS 38*  BILITOT 1.0  PROT 8.2  ALBUMIN 4.0   No results for input(s): LIPASE, AMYLASE in the last 168 hours. No results for input(s): AMMONIA in the last 168 hours. CBC:  Recent Labs Lab 04/29/14 1400 04/30/14 0545  WBC 16.0* 11.3*  HGB 11.2* 10.4*  HCT 35.4* 34.1*  MCV 71.2* 72.1*  PLT 492* 473*   Cardiac Enzymes: No results for input(s): CKTOTAL, CKMB, CKMBINDEX, TROPONINI in the last 168 hours. BNP (last 3 results) No results for input(s): BNP in the last 8760 hours.  ProBNP (last 3 results) No results for input(s): PROBNP in the last 8760 hours.  CBG: No results for input(s): GLUCAP in the last 168 hours.  No results found for this or any previous visit (from the past 240 hour(s)).   Studies: Ct Abdomen Pelvis W Contrast  04/30/2014   CLINICAL DATA:  Crohn's disease, lower extremity weakness, diffuse abdominal pain and diarrhea bloody diarrhea, Pt. Also complaining of full body tingling  EXAM: CT ABDOMEN AND PELVIS WITH CONTRAST  TECHNIQUE: Multidetector CT imaging of the abdomen and pelvis was performed using the standard protocol following bolus administration of intravenous contrast.  CONTRAST:  OMNIPAQUE IOHEXOL 300 MG/ML  SOLN  COMPARISON:  08/28/2013  FINDINGS: Visualized lung bases clear. Unremarkable liver, gallbladder, spleen, adrenal glands, kidneys, pancreas, aorta, portal vein. Stomach, small bowel, and colon are nondilated. Circumferential wall thickening in the terminal ileum. Mild circumferential wall thickening in the transverse colon, moderate circumferential thickening through the length of the descending colon with some surrounding mild inflammatory/edematous changes. No evidence of abscess. No free air. No ascites.  Urinary bladder incompletely distended. No adenopathy localized. Lumbar spine unremarkable. Bilateral  sacroiliitis.  IMPRESSION: 1. Moderate descending and mild  transverse colitis. 2. Circumferential wall thickening of the terminal ileum without obstruction. 3. No evidence of abscess.   Electronically Signed   By: Corlis Leak M.D.   On: 04/30/2014 12:48    Scheduled Meds: . feeding supplement (RESOURCE BREEZE)  1 Container Oral TID BM  . mesalamine  4.8 g Oral Q breakfast  . methylPREDNISolone (SOLU-MEDROL) injection  125 mg Intravenous Q12H   Continuous Infusions:   Principal Problem:   Exacerbation of Crohn's disease Active Problems:   Abdominal pain   Malnutrition of moderate degree   Crohn's disease   Crohn's colitis    CHIU, STEPHEN K  [facility] Pager [phone]. If 7PM-7AM, please contact night-coverage at [email or web address], password [email or web address] 04/30/2014, 6:34 PM  LOS: 1 day

## 2014-04-30 NOTE — Progress Notes (Signed)
INITIAL NUTRITION ASSESSMENT  DOCUMENTATION CODES Per approved criteria  -Severe malnutrition in the context of chronic illness   Pt meet criteria for severe MALNUTRITION in the context of chronic illness as evidenced by 14% weight loss in the past 6 months and > 75% PO intake in the past 2 months.   INTERVENTION: -Continue Resource Breeze TID providing 250 kcal and 9 g protein  NUTRITION DIAGNOSIS: Inadequate oral intake related to diarrhea and abdominal pain as evidenced by pt reported poor PO intake and 14 % weight loss in past 6 months.   Goal: Pt to meet >/= 90% of estimated needs  Monitor:  Pt PO intake, weight trends, labs  Reason for Assessment: MST  29 y.o. male  Admitting Dx: Exacerbation of Crohn's disease  ASSESSMENT: Pt hx of Chron's disease recently discharged from the hospital for Chron's exacerbation on steroid taper. Pt reports ongoing diarrhea and lower extremity weakness since discharge.  History of non-compliance.    Pt reports weight loss in the past year, is usually 180 lbs.  Pt has lost 14% of body weight in the past year per weight records (significant for time frame). Bloody diarrhea has been going on for the past two months. Pt reports he has not eaten anything in the past two weeks, prior to that he reports any food or liquids are painful to ingest so he stopped eating or drinking anything.    Pt currently on clear liquid diet but reports he will not eat or drink anything because of the pain. Will leave order for Resource Breeze in the event he wants to have one.  Height: Ht Readings from Last 1 Encounters:  04/29/14 6\' 3"  (1.905 m)    Weight: Wt Readings from Last 1 Encounters:  04/29/14 155 lb 6.4 oz (70.489 kg)    Ideal Body Weight: 196 lbs  % Ideal Body Weight: 79%  Wt Readings from Last 10 Encounters:  04/29/14 155 lb 6.4 oz (70.489 kg)  04/19/14 161 lb (73.029 kg)  04/19/14 161 lb (73.029 kg)  04/15/14 168 lb 11.2 oz (76.522 kg)   09/17/13 180 lb (81.647 kg)  08/27/13 166 lb 4.8 oz (75.433 kg)  09/19/12 221 lb 9.6 oz (100.517 kg)  08/29/12 219 lb (99.338 kg)    Usual Body Weight: 180 lbs  % Usual Body Weight: 86%  BMI:  19.3   Estimated Nutritional Needs: Kcal: 2100-2300 kcal Protein: 85-100 g protein Fluid:    Skin: WDL  Diet Order: Diet clear liquid  EDUCATION NEEDS: -No education needs identified at this time   Intake/Output Summary (Last 24 hours) at 04/30/14 1014 Last data filed at 04/29/14 1730  Gross per 24 hour  Intake    240 ml  Output      0 ml  Net    240 ml    Last BM: none charted   Labs:   Recent Labs Lab 04/29/14 1400 04/30/14 0545  NA 140 138  K 3.6 4.1  CL 106 105  CO2 25 22  BUN 10 9  CREATININE 0.77 0.65  CALCIUM 9.7 9.2  GLUCOSE 99 129*    CBG (last 3)  No results for input(s): GLUCAP in the last 72 hours.  Scheduled Meds: . feeding supplement (RESOURCE BREEZE)  1 Container Oral TID BM  . mesalamine  4.8 g Oral Q breakfast  . methylPREDNISolone (SOLU-MEDROL) injection  125 mg Intravenous Q12H    Continuous Infusions: . sodium chloride 75 mL/hr at 04/30/14 0213  Past Medical History  Diagnosis Date  . Crohn disease   . Anemia     required transfusion of bood in 2008 or 2009  . 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   . Internal hemorrhoids     Past Surgical History  Procedure Laterality Date  . Neg hx      Magdalen Spatz MS Dietetic Intern Pager Number [phone]

## 2014-04-30 NOTE — Progress Notes (Signed)
    Progress Note   Subjective   Back from CTscan.  Abdominal pain uncontrolled. Having rectal bleeding. Wants to change floors.     Objective   Vital signs in last 24 hours: Temp:  [97.2 F (36.2 C)-98.5 F (36.9 C)] 97.2 F (36.2 C) (02/23 0451) Pulse Rate:  [64-125] 64 (02/23 0451) Resp:  [18-36] 18 (02/23 0451) BP: (118-133)/(65-80) 133/80 mmHg (02/23 0451) SpO2:  [99 %-100 %] 100 % (02/23 0451) Last BM Date: 04/29/14 General:    white male in NAD Lungs: Respirations even and unlabored, lungs CTA bilaterally Abdomen:  Soft, nontender and nondistended. Normal bowel sounds. Extremities:  Without edema. Neurologic:  Alert and oriented,  grossly normal neurologically. Psych:  Cooperative. Normal mood and affect.     Lab Results:  Recent Labs  04/29/14 1400 04/30/14 0545  WBC 16.0* 11.3*  HGB 11.2* 10.4*  HCT 35.4* 34.1*  PLT 492* 473*   BMET  Recent Labs  04/29/14 1400 04/30/14 0545  NA 140 138  K 3.6 4.1  CL 106 105  CO2 25 22  GLUCOSE 99 129*  BUN 10 9  CREATININE 0.77 0.65  CALCIUM 9.7 9.2   LFT  Recent Labs  04/29/14 1400  PROT 8.2  ALBUMIN 4.0  AST 18  ALT 14  ALKPHOS 38*  BILITOT 1.0   CTscan (today)  IMPRESSION: 1. Moderate descending and mild transverse colitis. 2. Circumferential wall thickening of the terminal ileum without obstruction. 3. 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 Treatment has been complicated by finances, depression, narcotic addiction, and poor continuity of care (has seen multiple gastroenterologists). Patient untreated over last 6 months, now with worsening abdominal pain, hematochezia. CTscan suggests active disease in colon and thickening of TI. GI pathogen panel in progress, in the meantime patient getting IV corticosteroids and supportive care. Dr. Marina Goodell has recommended that patient return to [facility] where he was previously managed for refractory Crohn's  disease. Patient apparently unable to return there because of insurance but [facility] is option. Hopefully patient will respond to IV steroids, can be transitioned to oral steroids and be referred for outpatient appointment to [facility].    LOS: 1 day   Willette Cluster  04/30/2014, 9:39 AM     Attending physician's note   I have taken an interval history, reviewed the chart and examined the patient. I agree with the Advanced Practitioner's note, impression and recommendations.   Venita Lick. Russella Dar, MD [facility]

## 2014-04-30 NOTE — Progress Notes (Signed)
CARE MANAGEMENT NOTE 04/30/2014  Patient:  Troy Ramirez, Troy Ramirez   Account Number:  [id number]  Date Initiated:  04/30/2014  Documentation initiated by:  Ferdinand Cava  Subjective/Objective Assessment:   29 yo male admitted with Crohn's flare from home     Action/Plan:   discharge planning   Anticipated DC Date:  05/02/2014   Anticipated DC Plan:  HOME/SELF CARE      DC Planning Services  CM consult      Choice offered to / List presented to:             Status of service:   Medicare Important Message given?   (If response is "NO", the following Medicare IM given date fields will be blank) Date Medicare IM given:   Medicare IM given by:   Date Additional Medicare IM given:   Additional Medicare IM given by:    Discharge Disposition:  HOME/SELF CARE  Per UR Regulation:    If discussed at Long Length of Stay Meetings, dates discussed:    Comments:  04/30/14 Ferdinand Cava RN [facility] CM [phone] Patient stated that he will lose his insurance coverage in March. He stated that he has already been set up at [facility] and follows with Dr. Armen Pickup. Discussed the financial counselors at the [facility] and encouraged patient to follow up once he is without insurance to check availability of resources once he is uninsured. Also encouarged the patient to follow up with [facility] for affordability of medications.

## 2014-05-01 DIAGNOSIS — K921 Melena: Secondary | ICD-10-CM

## 2014-05-01 DIAGNOSIS — K50919 Crohn's disease, unspecified, with unspecified complications: Secondary | ICD-10-CM

## 2014-05-01 DIAGNOSIS — K5080 Crohn's disease of both small and large intestine without complications: Secondary | ICD-10-CM

## 2014-05-01 LAB — GI PATHOGEN PANEL BY PCR, STOOL
C difficile toxin A/B: NOT DETECTED
CRYPTOSPORIDIUM BY PCR: NOT DETECTED
Campylobacter by PCR: NOT DETECTED
E COLI (ETEC) LT/ST: NOT DETECTED
E coli (STEC): NOT DETECTED
E coli 0157 by PCR: NOT DETECTED
G lamblia by PCR: NOT DETECTED
NOROVIRUS G1/G2: NOT DETECTED
Rotavirus A by PCR: NOT DETECTED
Salmonella by PCR: NOT DETECTED
Shigella by PCR: NOT DETECTED

## 2014-05-01 MED ORDER — BUDESONIDE 3 MG PO CP24
9.0000 mg | ORAL_CAPSULE | Freq: Every day | ORAL | Status: DC
  Administered 2014-05-01 – 2014-05-05 (×5): 9 mg via ORAL
  Filled 2014-05-01 (×6): qty 3

## 2014-05-01 MED ORDER — METHYLPREDNISOLONE SODIUM SUCC 125 MG IJ SOLR
60.0000 mg | Freq: Two times a day (BID) | INTRAMUSCULAR | Status: DC
  Administered 2014-05-01 – 2014-05-06 (×10): 60 mg via INTRAVENOUS
  Filled 2014-05-01 (×12): qty 0.96

## 2014-05-01 MED ORDER — SODIUM CHLORIDE 0.9 % IV SOLN
INTRAVENOUS | Status: AC
  Administered 2014-05-01: 11:00:00 via INTRAVENOUS

## 2014-05-01 NOTE — Progress Notes (Signed)
[facility] PROGRESS NOTE  Troy Ramirez UXL:[id number] DOB: 12/06/1985 DOA: 04/29/2014 PCP: Lora Paula, MD  Assessment/Plan: 1. Crohn's disease/abd pain 1. CT with findings of active Crohn's colitis 2. GI following. Improving-decreased abdominal pain and hematochezia. 3. On IV steroids for now, hopefully able to transition to PO abx soon. GI starting Entocort and stopped Lialda. 4. GI recs noted. Pt is being arranged to follow up at [facility] 5. Was nothing by mouth. Starting clears as tolerated on 2/24 6. Change PO dilaudid to IV for now as active colitis seen on imaging 2. Chronic abd pain 3. Moderate protein calorie malnutrition 1. Nutrition following 2. Recs for Ensure TID 4. DVT prophylaxis 1. SCD's  Anemia: Follow CBCs.  Code Status: Full Family Communication: Discussed with girlfriend at bedside Disposition Plan: Home when medically stable   Consultants:  GI  Procedures:  None  Antibiotics:   None  HPI/Subjective: Feels better. Mild nausea but no vomiting. Loose stools and mild blood streaks. Abdominal pain controlled.  Objective: Filed Vitals:   04/30/14 1721 04/30/14 2102 05/01/14 0531 05/01/14 1425  BP: 149/84 147/87 132/82 140/83  Pulse: 90 83 89 75  Temp: 97.9 F (36.6 C) 97.9 F (36.6 C) 97.7 F (36.5 C) 97.7 F (36.5 C)  TempSrc: Oral Oral Oral Oral  Resp: SpO2: 100% 100% 99% 100%    Intake/Output Summary (Last 24 hours) at 05/01/14 1900 Last data filed at 05/01/14 1700  Gross per 24 hour  Intake    960 ml  Output      0 ml  Net    960 ml   There were no vitals filed for this visit.  Exam:   General:  Awake, in nad  Cardiovascular: regular,s 1, s2  Respiratory: normal resp effort,no wheezing  Abdomen: soft, nondistended, tender generally to mild palpation. No rigidity, guarding or rebound. Normal bowel sounds heard.  Musculoskeletal: perfused, no clubbing   Data Reviewed: Basic Metabolic  Panel:  Recent Labs Lab 04/29/14 1400 04/30/14 0545  NA 140 138  K 3.6 4.1  CL 106 105  CO2 25 22  GLUCOSE 99 129*  BUN 10 9  CREATININE 0.77 0.65  CALCIUM 9.7 9.2   Liver Function Tests:  Recent Labs Lab 04/29/14 1400  AST 18  ALT 14  ALKPHOS 38*  BILITOT 1.0  PROT 8.2  ALBUMIN 4.0   No results for input(s): LIPASE, AMYLASE in the last 168 hours. No results for input(s): AMMONIA in the last 168 hours. CBC:  Recent Labs Lab 04/29/14 1400 04/30/14 0545  WBC 16.0* 11.3*  HGB 11.2* 10.4*  HCT 35.4* 34.1*  MCV 71.2* 72.1*  PLT 492* 473*   Cardiac Enzymes: No results for input(s): CKTOTAL, CKMB, CKMBINDEX, TROPONINI in the last 168 hours. BNP (last 3 results) No results for input(s): BNP in the last 8760 hours.  ProBNP (last 3 results) No results for input(s): PROBNP in the last 8760 hours.  CBG: No results for input(s): GLUCAP in the last 168 hours.  No results found for this or any previous visit (from the past 240 hour(s)).   Studies: Ct Abdomen Pelvis W Contrast  04/30/2014   CLINICAL DATA:  Crohn's disease, lower extremity weakness, diffuse abdominal pain and diarrhea bloody diarrhea, Pt. Also complaining of full body tingling  EXAM: CT ABDOMEN AND PELVIS WITH CONTRAST  TECHNIQUE: Multidetector CT imaging of the abdomen and pelvis was performed using the standard protocol following bolus administration of intravenous contrast.  CONTRAST:  OMNIPAQUE IOHEXOL 300 MG/ML  SOLN  COMPARISON:  08/28/2013  FINDINGS: Visualized lung bases clear. Unremarkable liver, gallbladder, spleen, adrenal glands, kidneys, pancreas, aorta, portal vein. Stomach, small bowel, and colon are nondilated. Circumferential wall thickening in the terminal ileum. Mild circumferential wall thickening in the transverse colon, moderate circumferential thickening through the length of the descending colon with some surrounding mild inflammatory/edematous changes. No evidence of abscess. No  free air. No ascites.  Urinary bladder incompletely distended. No adenopathy localized. Lumbar spine unremarkable. Bilateral sacroiliitis.  IMPRESSION: 1. Moderate descending and mild  transverse colitis. 2. Circumferential wall thickening of the terminal ileum without obstruction. 3. No evidence of abscess.   Electronically Signed   By: Corlis Leak M.D.   On: 04/30/2014 12:48    Scheduled Meds: . budesonide  9 mg Oral Daily  . feeding supplement (RESOURCE BREEZE)  1 Container Oral TID BM  . methylPREDNISolone (SOLU-MEDROL) injection  60 mg Intravenous Q12H   Continuous Infusions: . sodium chloride 75 mL/hr at 05/01/14 1053    Principal Problem:   Exacerbation of Crohn's disease Active Problems:   Abdominal pain   Malnutrition of moderate degree   Crohn's disease   Crohn's colitis   Abdominal pain, acute   Crohn's ileocolitis   Hematochezia  Time spent: 25 minutes  Willy Vorce, MD, FACP, FHM. [facility] Pager [phone]  If 7PM-7AM, please contact night-coverage [email or web address] Password [email or web address] 05/01/2014, 7:04 PM      LOS: 2 days

## 2014-05-01 NOTE — Progress Notes (Addendum)
    Progress Note   Subjective  Feels okay right now, just got Dilaudid. Still having frequent loose stools but bleeding a little less   Objective   Vital signs in last 24 hours: Temp:  [97.7 F (36.5 C)-97.9 F (36.6 C)] 97.7 F (36.5 C) (02/24 0531) Pulse Rate:  [83-90] 89 (02/24 0531) Resp:  [18-20] 18 (02/24 0531) BP: (132-149)/(82-87) 132/82 mmHg (02/24 0531) SpO2:  [99 %-100 %] 99 % (02/24 0531) Last BM Date: 04/30/14 General:    Thin white male in NAD Heart:  Regular rate and rhythm Abdomen:  Soft, nontender and nondistended. Normal bowel sounds. Neurologic:  Alert and oriented,  grossly normal neurologically. Psych:  Cooperative. Normal mood and affect.    Lab Results:  Recent Labs  04/29/14 1400 04/30/14 0545  WBC 16.0* 11.3*  HGB 11.2* 10.4*  HCT 35.4* 34.1*  PLT 492* 473*   BMET  Recent Labs  04/29/14 1400 04/30/14 0545  NA 140 138  K 3.6 4.1  CL 106 105  CO2 25 22  GLUCOSE 99 129*  BUN 10 9  CREATININE 0.77 0.65  CALCIUM 9.7 9.2   LFT  Recent Labs  04/29/14 1400  PROT 8.2  ALBUMIN 4.0  AST 18  ALT 14  ALKPHOS 38*  BILITOT 1.0   Studies/Results: Ct Abdomen Pelvis W Contrast  04/30/2014   CLINICAL DATA:  Crohn's disease, lower extremity weakness, diffuse abdominal pain and diarrhea bloody diarrhea, Pt. Also complaining of full body tingling  EXAM: CT ABDOMEN AND PELVIS WITH CONTRAST  TECHNIQUE: Multidetector CT imaging of the abdomen and pelvis was performed using the standard protocol following bolus administration of intravenous contrast.  CONTRAST:  OMNIPAQUE IOHEXOL 300 MG/ML  SOLN  COMPARISON:  08/28/2013  FINDINGS: Visualized lung bases clear. Unremarkable liver, gallbladder, spleen, adrenal glands, kidneys, pancreas, aorta, portal vein. Stomach, small bowel, and colon are nondilated. Circumferential wall thickening in the terminal ileum. Mild circumferential wall thickening in the transverse colon, moderate circumferential  thickening through the length of the descending colon with some surrounding mild inflammatory/edematous changes. No evidence of abscess. No free air. No ascites.  Urinary bladder incompletely distended. No adenopathy localized. Lumbar spine unremarkable. Bilateral sacroiliitis.  IMPRESSION: 1. Moderate descending and mild  transverse colitis. 2. Circumferential wall thickening of the terminal ileum without obstruction. 3. No evidence of abscess.   Electronically Signed   By: Corlis Leak M.D.   On: 04/30/2014 12:48     Assessment / Plan:    29 year old male with Crohn's ileocolitis, untreated over last 6 months and now with worsening abdominal pain, hematochezia. CTscan suggests active disease in colon and thickening of TI. GI pathogen panel still pending. Less bleeding, hopefully he is responding to steroids. Pain better this am (with Dilaudid). Continue supportive care. Our office is in process of getting an outpatient referral to [facility]. Trial of clears as tolerated.    LOS: 2 days   Willette Cluster  05/01/2014, 9:43 AM     Attending physician's note   I have taken an interval history, reviewed the chart and examined the patient. I agree with the Advanced Practitioner's note, impression and recommendations. Abd pain and hematochezia have improved. Continue IV Solumedrol at 60 mg q12h and continue bowel rest. Start Entocort 9 mg daily. DC Lialda. Trial of clear liquids as tolerated.   Venita Lick. Russella Dar, MD [facility]

## 2014-05-02 DIAGNOSIS — K50811 Crohn's disease of both small and large intestine with rectal bleeding: Principal | ICD-10-CM

## 2014-05-02 DIAGNOSIS — K921 Melena: Secondary | ICD-10-CM

## 2014-05-02 DIAGNOSIS — D638 Anemia in other chronic diseases classified elsewhere: Secondary | ICD-10-CM

## 2014-05-02 LAB — CBC
HCT: 34.5 % — ABNORMAL LOW (ref 39.0–52.0)
HEMOGLOBIN: 10.6 g/dL — AB (ref 13.0–17.0)
MCH: 22.4 pg — ABNORMAL LOW (ref 26.0–34.0)
MCHC: 30.7 g/dL (ref 30.0–36.0)
MCV: 72.8 fL — ABNORMAL LOW (ref 78.0–100.0)
Platelets: 532 10*3/uL — ABNORMAL HIGH (ref 150–400)
RBC: 4.74 MIL/uL (ref 4.22–5.81)
RDW: 15.1 % (ref 11.5–15.5)
WBC: 12.6 10*3/uL — ABNORMAL HIGH (ref 4.0–10.5)

## 2014-05-02 LAB — BASIC METABOLIC PANEL
Anion gap: 7 (ref 5–15)
BUN: 13 mg/dL (ref 6–23)
CALCIUM: 9.1 mg/dL (ref 8.4–10.5)
CO2: 29 mmol/L (ref 19–32)
Chloride: 103 mmol/L (ref 96–112)
Creatinine, Ser: 0.71 mg/dL (ref 0.50–1.35)
GFR calc Af Amer: >90 mL/min (ref >90)
Glucose, Bld: 113 mg/dL — ABNORMAL HIGH (ref 70–99)
Potassium: 3.9 mmol/L (ref 3.5–5.1)
SODIUM: 139 mmol/L (ref 135–145)

## 2014-05-02 MED ORDER — FAMOTIDINE 20 MG PO TABS
20.0000 mg | ORAL_TABLET | Freq: Two times a day (BID) | ORAL | Status: DC | PRN
  Administered 2014-05-02 – 2014-05-06 (×3): 20 mg via ORAL
  Filled 2014-05-02 (×4): qty 1

## 2014-05-02 MED ORDER — SODIUM CHLORIDE 0.9 % IV SOLN
INTRAVENOUS | Status: AC
  Administered 2014-05-02: 18:00:00 via INTRAVENOUS

## 2014-05-02 NOTE — Progress Notes (Signed)
[name] Progress Note  Subjective:    [name]. [name]. Emotional, [name]. Worries he will not have access to care for his Crohns. Tearful as sister committed suicide and he says he has not yet come to grips with it, and his twin brother has CP and has been ill. Doesn't know how much more stress he can take. [name], but developed antibodies to it. [name], and could not stay on cimzia due to cost. He says he does not want to leave hospital til he is pain free and has a clear cut follow up plan.   Objective:  Vital signs in last 24 hours: Temp:  [97.4 F (36.3 C)-98.8 F (37.1 C)] 97.4 F (36.3 C) (02/25 0750) Pulse Rate:  [50-75] 60 (02/25 0750) Resp:  [16-20] 20 (02/25 0750) BP: (129-144)/(78-98) 144/92 mmHg (02/25 0750) SpO2:  [100 %] 100 % (02/25 0750) Last BM Date: 05/01/14 General:   Alert,  Well-developed, in NAD Heart:  Regular rate and rhythm; no murmurs Pulm;lungs clear Abdomen:  Soft, mild RLQ tenderness, nondistended. Normal bowel sounds, without guarding, and without rebound.   Extremities:  Without edema. Neurologic:  Alert and  oriented x4;  grossly normal neurologically. Psych:  Alert and cooperative. Normal mood and affect.  Intake/Output from previous day: 02/24 0701 - 02/25 0700 In: 720 [P.O.:720] Out: -  Intake/Output this shift:    Lab Results:  Recent Labs  04/29/14 1400 04/30/14 0545 05/02/14 0550  WBC 16.0* 11.3* 12.6*  HGB 11.2* 10.4* 10.6*  HCT 35.4* 34.1* 34.5*  PLT 492* 473* 532*   BMET  Recent Labs  04/29/14 1400 04/30/14 0545 05/02/14 0550  NA 140 138 139  K 3.6 4.1 3.9  CL 106 105 103  CO2 GLUCOSE 99 129* 113*  BUN CREATININE 0.77 0.65 0.71  CALCIUM 9.7 9.2 9.1    LFT  Recent Labs  04/29/14 1400  PROT 8.2  ALBUMIN 4.0  AST 18  ALT 14  ALKPHOS 38*  BILITOT 1.0     ASSESSMENT/PLAN:   29 year old male with Crohn's ileocolitis, untreated over last 6 months and now with worsening abdominal pain, hematochezia. CTscan suggests active disease in colon and thickening of TI. GI pathogen panel still pending. Less bleeding, hopefully he is responding to steroids. Pain better this am (with Dilaudid). Continue supportive care. Our office is in process of getting an outpatient referral to [facility]. Continue clears as he is tol only small amounts at a time. ? If counseling can see pt while inpt as he is having difficulty dealing with multiple issues.     LOS: 3 days   Hvozdovic, Moise Boring 05/02/2014, Pager [phone]     Attending physician's note   I have taken an interval history, reviewed the chart and examined the patient. I agree with the Advanced Practitioner's note, impression and recommendations. His Crohn's appears to be responding nicely to IV Solumedrol. PO Entocort was added. Plan for short term mgmt of Crohn's with IV and then PO steroids. Complicated situation and not sure which IBD medications will best serve him over the long term and this will require mgmt at a tertiary center. GI follow up will be with Dr. Yancey Flemings until tertiary  IBD follow up is in place. Office is trying to arrange follow up with an IBD specialist at [facility]. Suggest inpatient psych consultation with multiple identified stressors-defer decisions to hospitalist service.  Venita Lick. Russella Dar, MD [facility]

## 2014-05-02 NOTE — Progress Notes (Signed)
PROGRESS NOTE    Bryant Saye ZOX:[id number] DOB: 28-Feb-1986 DOA: 04/29/2014 PCP: Lora Paula, MD  HPI/Brief narrative 29 year old male with Crohn's ileocolitis, untreated over last 6 months, recent DC from hospital for Crohn's flare on 04/16/14 and now with worsening abdominal pain, hematochezia. CTscan suggests active disease in colon and thickening of TI.   Assessment/Plan:  1. Crohn's exacerbation: GI consulted. Patient started on IV Solu-Medrol and PO Entocort added. Improving with decreasing abdominal pain, diarrhea and bleeding. Apparently did not tolerate Remicade or methotrexate [name]. As per GI, complicated situation and not sure which IBD medications will best serve him over the long-term and this will require management at a tertiary center. GI follow-up will be with Dr. Yancey Flemings until tertiary IBD follow-up is in place. GI office trying to arrange follow-up with IBD specialist at [facility]. Tolerating clear liquids. GI pathogen panel PCR: Negative. 2. Chronic abdominal pain: Related to underlying Crohn's. Continue current pain regimen. 3. Moderate protein calorie malnutrition: Management per dietitian. Again related to underlying Crohn's. 4. Possible depression: When GI team evaluated patient today, patient was emotional, upset and tearful regarding multiple issues-losing job, pending loss of insurance on March 5, worries that he will not have access to care for his health issues, sister committed suicide, one brother has CP and is ill. Psychiatry consulted. 5. Anemia: Stable 6. Leukocytosis: Secondary to steroids.   Code Status: Full Family Communication: None at bedside Disposition Plan: Home when medically stable   Consultants:  [facility]  Procedures:  None  Antibiotics:  None   Subjective: Abdominal pain, intermittent and rated at 7/10. 2 watery BM's in the last 24 hours with minimal blood. Nausea but no vomiting. Tolerating  small clears.  Objective: Filed Vitals:   05/02/14 0546 05/02/14 0750 05/02/14 1130 05/02/14 1349  BP: 129/78 144/92 146/80 162/105  Pulse: 50 60 73 70  Temp: 97.7 F (36.5 C) 97.4 F (36.3 C) 97.7 F (36.5 C) 97.6 F (36.4 C)  TempSrc: Oral Axillary Axillary Axillary  Resp: SpO2: 100% 100% 100% 100%    Intake/Output Summary (Last 24 hours) at 05/02/14 1643 Last data filed at 05/01/14 1700  Gross per 24 hour  Intake    360 ml  Output      0 ml  Net    360 ml   There were no vitals filed for this visit.   Exam:  General exam: Pleasant young male lying comfortably supine in bed. Respiratory system: Clear. No increased work of breathing. Cardiovascular system: S1 & S2 heard, RRR. No JVD, murmurs, gallops, clicks or pedal edema. Gastrointestinal system: Abdomen is nondistended, soft. Mild tenderness in right mid and lower quadrants without rigidity, guarding or rebound. Normal bowel sounds heard. Central nervous system: Alert and oriented. No focal neurological deficits. Extremities: Symmetric 5 x 5 power. Psychiatric: Flat affect. Was not tearful during this MDs visit prior to GI follow up.   Data Reviewed: Basic Metabolic Panel:  Recent Labs Lab 04/29/14 1400 04/30/14 0545 05/02/14 0550  NA 140 138 139  K 3.6 4.1 3.9  CL 106 105 103  CO2 GLUCOSE 99 129* 113*  BUN CREATININE 0.77 0.65 0.71  CALCIUM 9.7 9.2 9.1   Liver Function Tests:  Recent Labs Lab 04/29/14 1400  AST 18  ALT 14  ALKPHOS 38*  BILITOT 1.0  PROT 8.2  ALBUMIN 4.0  No results for input(s): LIPASE, AMYLASE in the last 168 hours. No results for input(s): AMMONIA in the last 168 hours. CBC:  Recent Labs Lab 04/29/14 1400 04/30/14 0545 05/02/14 0550  WBC 16.0* 11.3* 12.6*  HGB 11.2* 10.4* 10.6*  HCT 35.4* 34.1* 34.5*  MCV 71.2* 72.1* 72.8*  PLT 492* 473* 532*   Cardiac Enzymes: No results for input(s): CKTOTAL, CKMB, CKMBINDEX, TROPONINI in the  last 168 hours. BNP (last 3 results) No results for input(s): PROBNP in the last 8760 hours. CBG: No results for input(s): GLUCAP in the last 168 hours.  No results found for this or any previous visit (from the past 240 hour(s)).         Studies: No results found.      Scheduled Meds: . budesonide  9 mg Oral Daily  . feeding supplement (RESOURCE BREEZE)  1 Container Oral TID BM  . methylPREDNISolone (SOLU-MEDROL) injection  60 mg Intravenous Q12H   Continuous Infusions:   Principal Problem:   Exacerbation of Crohn's disease Active Problems:   Abdominal pain   Malnutrition of moderate degree   Crohn's disease   Crohn's colitis   Abdominal pain, acute   Crohn's ileocolitis   Hematochezia    Time spent: 25 minutes.    Marcellus Scott, MD, FACP, FHM. [facility] Pager [phone]  If 7PM-7AM, please contact night-coverage [email or web address] Password [facility] 05/02/2014, 4:43 PM    LOS: 3 days

## 2014-05-03 DIAGNOSIS — F32A Depression, unspecified: Secondary | ICD-10-CM | POA: Diagnosis present

## 2014-05-03 DIAGNOSIS — F329 Major depressive disorder, single episode, unspecified: Secondary | ICD-10-CM

## 2014-05-03 MED ORDER — MIRTAZAPINE 7.5 MG PO TABS
7.5000 mg | ORAL_TABLET | Freq: Every day | ORAL | Status: DC
  Administered 2014-05-03 – 2014-05-06 (×4): 7.5 mg via ORAL
  Filled 2014-05-03 (×5): qty 1

## 2014-05-03 MED ORDER — VENLAFAXINE HCL ER 37.5 MG PO CP24
37.5000 mg | ORAL_CAPSULE | Freq: Every day | ORAL | Status: DC
  Administered 2014-05-03 – 2014-05-07 (×5): 37.5 mg via ORAL
  Filled 2014-05-03 (×7): qty 1
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
## 2014-05-03 NOTE — Progress Notes (Signed)
     [facility] Progress Note  Subjective:   Less pain. Tolerating clears, hungry, would like to advance diet. Had 4 losse BMs yesterday with less blood. Spirits improved today, but continues to voice concerns over abilty to access care after discharge due to financial issues.   Objective:  Vital signs in last 24 hours: Temp:  [97.5 F (36.4 C)-97.7 F (36.5 C)] 97.5 F (36.4 C) (02/26 0442) Pulse Rate:  [63-73] 63 (02/26 0442) Resp:  [20-22] 22 (02/26 0442) BP: (146-162)/(64-105) 152/90 mmHg (02/26 0442) SpO2:  [100 %] 100 % (02/26 0442) Last BM Date: 05/02/14 General:   Alert,  Well-developed, male in NAD Heart:  Regular rate and rhythm; no murmurs Pulm;lungs clear Abdomen:  Soft, nontender and nondistended. Normal bowel sounds, without guarding, and without rebound.   Extremities:  Without edema. Neurologic:  Alert and  oriented x4;  grossly normal neurologically. Psych:  Alert and cooperative. Normal mood and affect.    Lab Results:  Recent Labs  05/02/14 0550  WBC 12.6*  HGB 10.6*  HCT 34.5*  PLT 532*   BMET  Recent Labs  05/02/14 0550  NA 139  K 3.9  CL 103  CO2 29  GLUCOSE 113*  BUN 13  CREATININE 0.71  CALCIUM 9.1     ASSESSMENT/PLAN:    29 year old male with Crohn's ileocolitis, untreated over last 6 months and now with worsening abdominal pain, hematochezia. CTscan suggests active disease in colon and thickening of TI. GI pathogen panel neg. Contiue supportive care. Advance diet. Will check with office re: progress arranging f/u with IBD specialist at [facility] (pt will follow with Dr Marina Goodell until tertiary IBD f/u in place). Pt would benefit from psych eval to assist managing stressors.    LOS: 4 days   Hvozdovic, Moise Boring 05/03/2014, Pager [phone]     Attending physician's note   I have taken an interval history, reviewed the chart and examined the patient. I agree with the Advanced Practitioner's note, impression and  recommendations. Advance diet. Consider changing to Prednisone over weekend if he continues to improve.   Venita Lick. Russella Dar, MD [facility]
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
## 2014-05-03 NOTE — Progress Notes (Signed)
PROGRESS NOTE    Troy Ramirez ZOX:[id number] DOB: May 03, 1985 DOA: 04/29/2014 PCP: Lora Paula, MD  HPI/Brief narrative 29 year old male with Crohn's ileocolitis, untreated over last 6 months, recent DC from hospital for Crohn's flare on 04/16/14 and now with worsening abdominal pain, hematochezia. CTscan suggests active disease in colon and thickening of TI.   Assessment/Plan:  1. Crohn's exacerbation: GI consulted. Patient started on IV Solu-Medrol and PO Entocort added. Improving with decreasing abdominal pain, diarrhea and bleeding. Apparently did not tolerate Remicade or methotrexate [name]. As per GI, complicated situation and not sure which IBD medications will best serve him over the long-term and this will require management at a tertiary center. GI follow-up will be with Dr. Yancey Flemings until tertiary IBD follow-up is in place. GI office trying to arrange follow-up with IBD specialist at [facility]. GI pathogen panel PCR: Negative. Tolerated clears >will advance to full liquids. Per GI, consider changing to prednisone over weekend if he continues to improve. 2. Chronic abdominal pain: Related to underlying Crohn's. Continue current pain regimen. 3. Moderate protein calorie malnutrition: Management per dietitian. Again related to underlying Crohn's. 4. Depression: When GI team evaluated patient 2/25, patient was emotional, upset and tearful regarding multiple issues-losing job, pending loss of insurance on March 5, worries that he will not have access to care for his health issues, sister committed suicide, one brother has CP and is ill. Psychiatry consultation appreciated-started patient on Remeron and Effexor. 5. Anemia: Stable 6. Leukocytosis: Secondary to steroids.   Code Status: Full Family Communication: None at bedside Disposition Plan: Home when medically stable   Consultants:  [facility]  Psychiatry  Procedures:  None  Antibiotics:  None   Subjective: Patient states that he is gradually improving with improved appetite, decreasing nausea, abdominal pain and bloody diarrhea. Better spirits this morning.  Objective: Filed Vitals:   05/02/14 1349 05/02/14 2145 05/03/14 0442 05/03/14 1431  BP: 162/105 151/64 152/90 145/90  Pulse: 70 64 63 76  Temp: 97.6 F (36.4 C) 97.6 F (36.4 C) 97.5 F (36.4 C) 97.5 F (36.4 C)  TempSrc: Axillary Oral Oral Oral  Resp: SpO2: 100% 100% 100% 100%   No intake or output data in the 24 hours ending 05/03/14 1749 There were no vitals filed for this visit.   Exam:  General exam: Pleasant young male lying comfortably supine in bed. Respiratory system: Clear. No increased work of breathing. Cardiovascular system: S1 & S2 heard, RRR. No JVD, murmurs, gallops, clicks or pedal edema. Gastrointestinal system: Abdomen is nondistended, soft and nontender. Normal bowel sounds heard. Central nervous system: Alert and oriented. No focal neurological deficits. Extremities: Symmetric 5 x 5 power. Psychiatric: Cheerful and appropriately interacting.   Data Reviewed: Basic Metabolic Panel:  Recent Labs Lab 04/29/14 1400 04/30/14 0545 05/02/14 0550  NA 140 138 139  K 3.6 4.1 3.9  CL 106 105 103  CO2 GLUCOSE 99 129* 113*  BUN CREATININE 0.77 0.65 0.71  CALCIUM 9.7 9.2 9.1   Liver Function Tests:  Recent Labs Lab 04/29/14 1400  AST 18  ALT 14  ALKPHOS 38*  BILITOT 1.0  PROT 8.2  ALBUMIN 4.0   No results for input(s): LIPASE, AMYLASE in the last 168 hours. No results for input(s): AMMONIA in the last 168 hours. CBC:  Recent Labs Lab 04/29/14 1400 04/30/14 0545 05/02/14 0550  WBC 16.0* 11.3* 12.6*  HGB 11.2* 10.4* 10.6*  HCT 35.4* 34.1* 34.5*  MCV 71.2* 72.1* 72.8*  PLT 492* 473* 532*   Cardiac Enzymes: No results for input(s): CKTOTAL, CKMB, CKMBINDEX, TROPONINI in the  last 168 hours. BNP (last 3 results) No results for input(s): PROBNP in the last 8760 hours. CBG: No results for input(s): GLUCAP in the last 168 hours.  No results found for this or any previous visit (from the past 240 hour(s)).         Studies: No results found.      Scheduled Meds: . budesonide  9 mg Oral Daily  . feeding supplement (RESOURCE BREEZE)  1 Container Oral TID BM  . methylPREDNISolone (SOLU-MEDROL) injection  60 mg Intravenous Q12H  . mirtazapine  7.5 mg Oral QHS  . venlafaxine XR  37.5 mg Oral Q breakfast   Continuous Infusions: . sodium chloride 50 mL/hr at 05/02/14 1806    Principal Problem:   Depression Active Problems:   Abdominal pain   Exacerbation of Crohn's disease   Malnutrition of moderate degree   Crohn's disease   Crohn's colitis   Abdominal pain, acute   Crohn's ileocolitis   Hematochezia    Time spent: 25 minutes.    Marcellus Scott, MD, FACP, FHM. [facility] Pager [phone]  If 7PM-7AM, please contact night-coverage [email or web address] Password [facility] 05/03/2014, 5:49 PM    LOS: 4 days

## 2014-05-04 NOTE — Progress Notes (Signed)
Subjective: He ate a more varied meal yesterday and it did cause him some GI distress.  Overall he feels as if he is improving.  Objective: Vital signs in last 24 hours: Temp:  [97.5 F (36.4 C)-97.7 F (36.5 C)] 97.5 F (36.4 C) (02/27 0548) Pulse Rate:  [54-76] 54 (02/27 0548) Resp:  [20] 20 (02/27 0548) BP: (137-156)/(82-113) 137/82 mmHg (02/27 0548) SpO2:  [100 %] 100 % (02/27 0548) Last BM Date: 05/03/14  Intake/Output from previous day:   Intake/Output this shift:    General appearance: alert and no distress GI: some tenderness in the RLQ  Lab Results:  Recent Labs  05/02/14 0550  WBC 12.6*  HGB 10.6*  HCT 34.5*  PLT 532*   BMET  Recent Labs  05/02/14 0550  NA 139  K 3.9  CL 103  CO2 29  GLUCOSE 113*  BUN 13  CREATININE 0.71  CALCIUM 9.1   LFT No results for input(s): PROT, ALBUMIN, AST, ALT, ALKPHOS, BILITOT, BILIDIR, IBILI in the last 72 hours. PT/INR No results for input(s): LABPROT, INR in the last 72 hours. Hepatitis Panel No results for input(s): HEPBSAG, HCVAB, HEPAIGM, HEPBIGM in the last 72 hours. C-Diff No results for input(s): CDIFFTOX in the last 72 hours. Fecal Lactopherrin No results for input(s): FECLLACTOFRN in the last 72 hours.  Studies/Results: No results found.  Medications:  Scheduled: . budesonide  9 mg Oral Daily  . feeding supplement (RESOURCE BREEZE)  1 Container Oral TID BM  . methylPREDNISolone (SOLU-MEDROL) injection  60 mg Intravenous Q12H  . mirtazapine  7.5 mg Oral QHS  . venlafaxine XR  37.5 mg Oral Q breakfast   Continuous:   Assessment/Plan: 1) Crohn's ileocolitis. 2) Depression.   He reports feeling better, but he wants to be more cautious about his PO intake today.  No change with bowel movement frequency, 3-4 over the past 24 hours.  Plan: 1) Continue with Solumedrol and budesonide. 2) Check blood work tomorrow AM.  Obtain a current CRP value for future comparison.   LOS: 5 days   Nala Kachel  D 05/04/2014, 9:07 AM

## 2014-05-04 NOTE — Consult Note (Signed)
Psychiatry Consult follow-up note  Reason for Consult:  Depression and crohn's disease Referring Physician:  Dr. Waymon Amato  Patient Identification: Troy Ramirez MRN:  [id number] Principal Diagnosis: Depression Diagnosis:   Patient Active Problem List   Diagnosis Date Noted  . Depression [F32.9] 05/03/2014  . 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 Hematochezia [K92.1] 05/01/2014  . Abdominal pain, acute [R10.0]   . 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 Noncompliance with medication regimen [Z91.14] 04/29/2014  . Crohn's colitis [K50.10] 04/29/2014  . Syncope [R55] 04/20/2014  . Crohn's disease [K50.90] 04/19/2014  . Malnutrition of moderate degree [E44.0] 04/13/2014  . Hypokalemia [E87.6] 04/11/2014  . Exacerbation of Crohn's disease [K50.90] 04/11/2014  . Leukocytosis [D72.829]   . Anemia of chronic disease [D63.8]   . Abdominal pain [R10.9] 08/27/2013    Total Time spent with patient: 30 minutes  Subjective:   Troy Ramirez is a 29 y.o. male patient admitted with depression and crohn's disease.  HPI:  [name] is a 29 y.o. Male seen, chart reviewed and case discussed with Unk Lightning, LCSW for psychiatric consultation and evaluation of depression secondary to chronic medical condition and recently increased psychosocial stresses. Patient reported he lost his job on 04/11/2014 after working 3 years which caused financial difficulties, reportedly stressed from work and also has a issues with relationship. Patient reported he has been suffering with multiple flareups of chronic disease of Crohn's disease. Patient is stressed about losing his health insurance and paying for his care now on the near future. Patient stated he was given Ativan which helped him to calm down without having any chest tightness. Patient endorses feeling depression, sad, loss of interest, feeling isolated and helpless with his current psychosocial situation and also has  disturbance of sleep and appetite. Patient reported he has anxiety which is causing chest tightness. Patient denied previous history of acute psychiatric hospitalization or outpatient treatment. Patient has denied current suicidal, homicidal ideation, intention or plans. Patient has no evidence of psychosis. He is scheduled psychiatric evaluation as outpatient on 05/10/2014. Patient is willing to take medication for helping his depression, anxiety and disturbance of sleep and appetite with the low motivation.  Interval note: Patient seen today for psychiatric consultation follow-up. Patient reportedly taking his medication as prescribed and has no side effects. Patient reportedly slept well last night and feeling somewhat relieved this morning. Patient appeared sitting on his bed and browsing Internet on his ex-box which is borrowed from his brother. Patient is hopeful medication is known to have same and also agreed titrating higher dose if needed. Patient denied any stomach complaints this morning. Patient has no anxiety symptoms.  Medical history: Patient with past medical history of Crohn's disease recently discharged from the hospital for Crohn's exacerbation on 04/16/2014 on a steroid taper, he was tapered pretty quickly he went to his GI doctor complaining of lower extremity weakness abdominal pain and diarrhea bloody diarrhea so he was directly admitted to [facility] He relates that since he was discharged she is at ongoing diarrhea, when he finishes steroid treatment he started having more perfuse diarrhea with some blood on it. He's also complaining of full body tingling and somebody grabbing onto his ankles. He relates pain diffusely throughout his body nothing makes it better or worse.   Past Medical History:  Past Medical History  Diagnosis Date  . Crohn disease   . Anemia     required transfusion of bood in 2008 or 2009  . 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   . Internal hemorrhoids     Past Surgical History  Procedure Laterality Date  . Neg hx     Family History:  Family History  Problem Relation Age of Onset  . Diabetes      Both sides  . Heart disease      both sides  . Breast cancer Maternal Grandmother   . Prostate cancer Paternal Grandfather   . Colon cancer Neg Hx   . Diabetes Mellitus II Mother   . Hypertension Father   . Diabetes Mellitus II Father    Social History:  History  Alcohol Use  . Yes    Comment: rarely     History  Drug Use  . Yes  . Special: Marijuana    History   Social History  . Marital Status: Single    Spouse Name: N/A  . Number of Children: 0  . Years of Education: N/A   Occupational History  . manager    Social History Main Topics  . Smoking status: Never Smoker   . Smokeless tobacco: Never Used  . Alcohol Use: Yes     Comment: rarely  . Drug Use: Yes    Special: Marijuana  . Sexual Activity: Yes   Other Topics Concern  . None   Social History Narrative   Additional Social History:       Allergies:   Allergies  Allergen Reactions  . Humira [Adalimumab] Anaphylaxis  . Remicade [Infliximab] Anaphylaxis and Swelling    Vitals: Blood pressure 137/82, pulse 54, temperature 97.5 F (36.4 C), temperature source Oral, resp. rate 20, SpO2 100 %.  Risk to Self: Is patient at risk for suicide?: No Risk to Others:   Prior Inpatient Therapy:   Prior Outpatient Therapy:    Current Facility-Administered Medications  Medication Dose Route Frequency Provider Last Rate Last Dose  . budesonide (ENTOCORT EC) 24 hr capsule 9 mg  9 mg Oral Daily Meryl Dare, MD   9 mg at 05/04/14 0947  . famotidine (PEPCID) tablet 20 mg  20 mg Oral BID PRN Leda Gauze, NP   20 mg at 05/02/14 2017  . feeding supplement (RESOURCE BREEZE) (RESOURCE BREEZE) liquid 1 Container  1 Container Oral TID BM Marinda Elk, MD   1 Container at 05/04/14 1355  .  HYDROmorphone (DILAUDID) injection 2 mg  2 mg Intravenous Q4H PRN Jerald Kief, MD   2 mg at 05/04/14 1412  . LORazepam (ATIVAN) tablet 1 mg  1 mg Oral TID PRN Marinda Elk, MD   1 mg at 05/04/14 0042  . methylPREDNISolone sodium succinate (SOLU-MEDROL) 125 mg/2 mL injection 60 mg  60 mg Intravenous Q12H Meryl Dare, MD   60 mg at 05/04/14 0555  . mirtazapine (REMERON) tablet 7.5 mg  7.5 mg Oral QHS Nehemiah Settle, MD   7.5 mg at 05/03/14 2106  . ondansetron (ZOFRAN) tablet 4 mg  4 mg Oral Q6H PRN Marinda Elk, MD       Or  . ondansetron [facility]) injection 4 mg  4 mg Intravenous Q6H PRN Marinda Elk, MD   4 mg at 05/03/14 0442  . venlafaxine XR (EFFEXOR-XR) 24 hr capsule 37.5 mg  37.5 mg Oral Q breakfast Nehemiah Settle, MD   37.5 mg at 05/04/14 9147    Musculoskeletal: Strength & Muscle Tone: within normal limits Gait &  Station: normal Patient leans: N/A  Psychiatric Specialty Exam: Physical Exam as per history and physical   ROS depression anxious and stressed out   Blood pressure 137/82, pulse 54, temperature 97.5 F (36.4 C), temperature source Oral, resp. rate 20, SpO2 100 %.There is no weight on file to calculate BMI.  General Appearance: Casual  Eye Contact::  Good  Speech:  Clear and Coherent  Volume:  Normal  Mood:  Anxious and Depressed  Affect:  Appropriate and Congruent  Thought Process:  Coherent and Goal Directed  Orientation:  Full (Time, Place, and Person)  Thought Content:  WDL  Suicidal Thoughts:  No  Homicidal Thoughts:  No  Memory:  Immediate;   Good Recent;   Good  Judgement:  Intact  Insight:  Good  Psychomotor Activity:  Restlessness  Concentration:  Good  Recall:  Good  Fund of Knowledge:Good  Language: Good  Akathisia:  NA  Handed:  Right  AIMS (if indicated):     Assets:  Communication Skills Desire for Improvement Housing Intimacy Leisure Time Resilience Social  Support Talents/Skills Transportation  ADL's:  Intact  Cognition: WNL  Sleep:      Medical Decision Making: Review of Psycho-Social Stressors (1), Review or order clinical lab tests (1), Established Problem, Worsening (2), New Problem, with no additional work-up planned (3), Review or order medicine tests (1), Review of Medication Regimen & Side Effects (2) and Review of New Medication or Change in Dosage (2)  Treatment Plan Summary: Daily contact with patient to assess and evaluate symptoms and progress in treatment and Medication management  Plan:  Continue Effexor XL 37.5 mg daily for depression which can be titrated to 75 mg if clinically required Continue Remeron 7.5 mg at bedtime for insomnia and appetite Patient does not meet criteria for psychiatric inpatient admission. Supportive therapy provided about ongoing stressors.  Appreciate psychiatric consultation and follow up as clinically required Please contact [phone] or [phone] if needs further assistance  Disposition: Patient will be referred to the outpatient psychiatric services when medically stable.   Kyshaun Barnette,JANARDHAHA R. 05/04/2014 2:38 PM

## 2014-05-04 NOTE — Progress Notes (Signed)
PROGRESS NOTE    Troy Ramirez [id number]:[id number] DOB: Dec 14, 1985 DOA: 04/29/2014 PCP: Lora Paula, MD  HPI/Brief narrative 29 year old male with Crohn's ileocolitis, untreated over last 6 months, recent DC from hospital for Crohn's flare on 04/16/14 and now with worsening abdominal pain, hematochezia. CTscan suggests active disease in colon and thickening of TI. GI managing Crohn's flare. Improving. Possible DC early next week.  Assessment/Plan:  1. Crohn's exacerbation: GI consulted. Patient started on IV Solu-Medrol and PO Entocort added. Improving with decreasing abdominal pain, diarrhea and bleeding. Apparently did not tolerate Remicade or methotrexate [name]. As per GI, complicated situation and not sure which IBD medications will best serve him over the long-term and this will require management at a tertiary center. GI follow-up will be with Dr. Yancey Flemings until tertiary IBD follow-up is in place. GI office trying to arrange follow-up with IBD specialist at [facility]. GI pathogen panel PCR: Negative. Tolerated clears >advanced to full liquids. Per GI, consider changing to prednisone over weekend if he continues to improve. 2. Chronic abdominal pain: Related to underlying Crohn's. Continue current pain regimen. 3. Moderate protein calorie malnutrition: Management per dietitian. Again related to underlying Crohn's. 4. Depression: When GI team evaluated patient 2/25, patient was emotional, upset and tearful regarding multiple issues-losing job, pending loss of insurance on March 5, worries that he will not have access to care for his health issues, sister committed suicide, one brother has CP and is ill. Psychiatry consultation appreciated-started patient on Remeron and Effexor. Better. 5. Anemia: Stable 6. Leukocytosis: Secondary to steroids.   Code Status: Full Family Communication: None at bedside Disposition Plan: Home when medically  stable   Consultants:  [facility]  Psychiatry  Procedures:  None  Antibiotics:  None   Subjective: Overall feels that he is improving. States that he might have eaten more yesterday leading to some stomach upset.  Objective: Filed Vitals:   05/03/14 1431 05/03/14 2105 05/04/14 0548 05/04/14 1450  BP: 145/90 156/113 137/82 152/89  Pulse: 76 73 54 70  Temp: 97.5 F (36.4 C) 97.7 F (36.5 C) 97.5 F (36.4 C) 98.3 F (36.8 C)  TempSrc: Oral Oral Oral Oral  Resp: 20 20 20 20   SpO2: 100% 100% 100% 100%    Intake/Output Summary (Last 24 hours) at 05/04/14 1527 Last data filed at 05/04/14 1300  Gross per 24 hour  Intake    320 ml  Output      0 ml  Net    320 ml   There were no vitals filed for this visit.   Exam:  General exam: Pleasant young male lying comfortably supine in bed watching Xbox. Respiratory system: Clear. No increased work of breathing. Cardiovascular system: S1 & S2 heard, RRR. No JVD, murmurs, gallops, clicks or pedal edema. Gastrointestinal system: Abdomen is nondistended, soft and nontender. Normal bowel sounds heard. Central nervous system: Alert and oriented. No focal neurological deficits. Extremities: Symmetric 5 x 5 power. Psychiatric: Cheerful and appropriately interacting.   Data Reviewed: Basic Metabolic Panel:  Recent Labs Lab 04/29/14 1400 04/30/14 0545 05/02/14 0550  NA 140 138 139  K 3.6 4.1 3.9  CL 106 105 103  CO2 25 22 29   GLUCOSE 99 129* 113*  BUN 10 9 13   CREATININE 0.77 0.65 0.71  CALCIUM 9.7 9.2 9.1   Liver Function Tests:  Recent Labs Lab 04/29/14 1400  AST 18  ALT 14  ALKPHOS 38*  BILITOT  1.0  PROT 8.2  ALBUMIN 4.0   No results for input(s): LIPASE, AMYLASE in the last 168 hours. No results for input(s): AMMONIA in the last 168 hours. CBC:  Recent Labs Lab 04/29/14 1400 04/30/14 0545 05/02/14 0550  WBC 16.0* 11.3* 12.6*  HGB 11.2* 10.4* 10.6*  HCT 35.4* 34.1* 34.5*  MCV 71.2* 72.1* 72.8*   PLT 492* 473* 532*   Cardiac Enzymes: No results for input(s): CKTOTAL, CKMB, CKMBINDEX, TROPONINI in the last 168 hours. BNP (last 3 results) No results for input(s): PROBNP in the last 8760 hours. CBG: No results for input(s): GLUCAP in the last 168 hours.  No results found for this or any previous visit (from the past 240 hour(s)).         Studies: No results found.      Scheduled Meds: . budesonide  9 mg Oral Daily  . feeding supplement (RESOURCE BREEZE)  1 Container Oral TID BM  . methylPREDNISolone (SOLU-MEDROL) injection  60 mg Intravenous Q12H  . mirtazapine  7.5 mg Oral QHS  . venlafaxine XR  37.5 mg Oral Q breakfast   Continuous Infusions:    Principal Problem:   Depression Active Problems:   Abdominal pain   Exacerbation of Crohn's disease   Malnutrition of moderate degree   Crohn's disease   Crohn's colitis   Abdominal pain, acute   Crohn's ileocolitis   Hematochezia    Time spent: 20 minutes.    Marcellus Scott, MD, FACP, FHM. [facility] Pager [phone]  If 7PM-7AM, please contact night-coverage [email or web address] Password [facility] 05/04/2014, 3:27 PM    LOS: 5 days

## 2014-05-05 LAB — CBC
HCT: 41.9 % (ref 39.0–52.0)
Hemoglobin: 13.1 g/dL (ref 13.0–17.0)
MCH: 22.7 pg — ABNORMAL LOW (ref 26.0–34.0)
MCHC: 31.3 g/dL (ref 30.0–36.0)
MCV: 72.7 fL — ABNORMAL LOW (ref 78.0–100.0)
Platelets: 661 10*3/uL — ABNORMAL HIGH (ref 150–400)
RBC: 5.76 MIL/uL (ref 4.22–5.81)
RDW: 15.2 % (ref 11.5–15.5)
WBC: 11.9 10*3/uL — AB (ref 4.0–10.5)

## 2014-05-05 LAB — BASIC METABOLIC PANEL
ANION GAP: 12 (ref 5–15)
BUN: 12 mg/dL (ref 6–23)
CHLORIDE: 93 mmol/L — AB (ref 96–112)
CO2: 30 mmol/L (ref 19–32)
Calcium: 9.3 mg/dL (ref 8.4–10.5)
Creatinine, Ser: 0.7 mg/dL (ref 0.50–1.35)
Glucose, Bld: 95 mg/dL (ref 70–99)
Potassium: 3.4 mmol/L — ABNORMAL LOW (ref 3.5–5.1)
Sodium: 135 mmol/L (ref 135–145)

## 2014-05-05 LAB — C-REACTIVE PROTEIN: CRP: <0.5 mg/dL — ABNORMAL LOW (ref <0.60)

## 2014-05-05 MED ORDER — HYDRALAZINE HCL 25 MG PO TABS
25.0000 mg | ORAL_TABLET | Freq: Four times a day (QID) | ORAL | Status: DC | PRN
  Filled 2014-05-05: qty 1

## 2014-05-05 MED ORDER — POTASSIUM CHLORIDE CRYS ER 20 MEQ PO TBCR
40.0000 meq | EXTENDED_RELEASE_TABLET | Freq: Once | ORAL | Status: AC
  Administered 2014-05-05: 40 meq via ORAL
  Filled 2014-05-05: qty 2

## 2014-05-05 NOTE — Progress Notes (Signed)
Subjective: Anything more than soft foods such as grits causes an increase in pain.  Objective: Vital signs in last 24 hours: Temp:  [98 F (36.7 C)-98.3 F (36.8 C)] 98 F (36.7 C) (02/28 0553) Pulse Rate:  [58-101] 101 (02/28 0553) Resp:  [20] 20 (02/28 0553) BP: (138-152)/(89-102) 139/102 mmHg (02/28 0553) SpO2:  [100 %] 100 % (02/28 0553) Last BM Date: 05/04/14  Intake/Output from previous day: 02/27 0701 - 02/28 0700 In: 560 [P.O.:560] Out: 1400 [Urine:1400] Intake/Output this shift:    General appearance: alert and no distress GI: tender in the RLQ  Lab Results:  Recent Labs  05/05/14 0556  WBC 11.9*  HGB 13.1  HCT 41.9  PLT 661*   BMET  Recent Labs  05/05/14 0556  NA 135  K 3.4*  CL 93*  CO2 30  GLUCOSE 95  BUN 12  CREATININE 0.70  CALCIUM 9.3   LFT No results for input(s): PROT, ALBUMIN, AST, ALT, ALKPHOS, BILITOT, BILIDIR, IBILI in the last 72 hours. PT/INR No results for input(s): LABPROT, INR in the last 72 hours. Hepatitis Panel No results for input(s): HEPBSAG, HCVAB, HEPAIGM, HEPBIGM in the last 72 hours. C-Diff No results for input(s): CDIFFTOX in the last 72 hours. Fecal Lactopherrin No results for input(s): FECLLACTOFRN in the last 72 hours.  Studies/Results: No results found.  Medications:  Scheduled: . budesonide  9 mg Oral Daily  . feeding supplement (RESOURCE BREEZE)  1 Container Oral TID BM  . methylPREDNISolone (SOLU-MEDROL) injection  60 mg Intravenous Q12H  . mirtazapine  7.5 mg Oral QHS  . venlafaxine XR  37.5 mg Oral Q breakfast   Continuous:   Assessment/Plan: 1) Crohn's ileocolitis. 2) Depression.   He had 3 bowel movements yesterday and they were loose.  A small amount of blood during one of the bowel movements.  Gas pains were very difficult to tolerate.  Overall he is feeling better.  He still needs to stay on a soft diet for now.  Blood work essentially stable.  Plan: 1) Continue with Solumedrol and  budesonide.  LOS: 6 days   Hazely Sealey D 05/05/2014, 7:58 AM

## 2014-05-05 NOTE — Progress Notes (Signed)
[facility] PROGRESS NOTE  Troy Ramirez QAE:[id number] DOB: [birth year] DOA: 04/29/2014 PCP: Lora Paula, MD  brief narrative 29 year old male with Crohn's ileocolitis, untreated over last 6 months, recent DC from hospital for Crohn's flare on 04/16/14 and now with worsening abdominal pain, hematochezia. CTscan suggests active disease in colon and thickening of TI. GI managing Crohn's flare.   Assessment/Plan: Exacerbations of Crohn's disease Patient started on IV Medrol nothing by mouth Entocort added. Abdominal pain markedly improved. Had 3 episode of loose bowel movement. No bleeding. Apparently did not tolerate Remicade or methotrexate [name]. As per GI, this is a complicated situation as it is difficult to determine which IBD medications will best work for him long-term and this will require management at a tertiary center. GI follow-up will be with Dr. Yancey Flemings until tertiary IBD follow-up is in place. GI office trying to arrange follow-up with IBD specialist at [facility]. GI pathogen panel PCR was Negative. Continue full liquid diet. Possibly switch to oral prednisone tomorrow.  Chronic abdominal pain:  Related to underlying Crohn's. Continue current pain regimen.  Moderate protein calorie malnutrition: Secondary to underlying Crohn's. Thrombocytopenia supplements.  Depression:  On 2/25 patient was emotional, upset and tearful regarding multiple issues-losing job, pending loss of insurance on March 5, worries that he will not have access to care for his health issues, sister committed suicide, one brother has CP and is ill. Psychiatry consulted and patient was started patient on Remeron and Effexor. Times are much better at this time.  Anemia:  Iron deficiency. Currently stable  Leukocytosis: Secondary to steroids.   Code Status: Full code Family Communication: Friend at bedside Disposition Plan: Home possibly tomorrow if continues to  improve   Consultants:  GI ( Dr hung covering for labeaur)  Procedures:  ET scan of the abdomen  Antibiotics:  None  HPI/Subjective: Seen and examined. Abdominal pain better but reports gaseous distention.Marland Kitchen Has had 3 loose bowel movement yesterday with a small amount of blood. Denies nausea or vomiting. tolerating full liquid diet.  Objective: Filed Vitals:   05/05/14 1500  BP: 157/81  Pulse: 86  Temp: 98 F (36.7 C)  Resp: 18    Intake/Output Summary (Last 24 hours) at 05/05/14 1552 Last data filed at 05/05/14 1432  Gross per 24 hour  Intake    240 ml  Output   1500 ml  Net  -1260 ml   There were no vitals filed for this visit.  Exam:   General: Young thin built male in no acute distress  HEENT: Pallor present, moist oral mucosa, neck supple  Chest: Clear to auscultation  CVS: S1 and S2, no murmurs rub or gallop  GI: Soft, nondistended, mild tenderness, bowel sounds present  musculoskeletal: Warm, no edema  Data Reviewed: Basic Metabolic Panel:  Recent Labs Lab 04/29/14 1400 04/30/14 0545 05/02/14 0550 05/05/14 0556  NA 140 138 139 135  K 3.6 4.1 3.9 3.4*  CL 106 105 103 93*  CO2 25 22 29 30   GLUCOSE 99 129* 113* 95  BUN 10 9 13 12   CREATININE 0.77 0.65 0.71 0.70  CALCIUM 9.7 9.2 9.1 9.3   Liver Function Tests:  Recent Labs Lab 04/29/14 1400  AST 18  ALT 14  ALKPHOS 38*  BILITOT 1.0  PROT 8.2  ALBUMIN 4.0   No results for input(s): LIPASE, AMYLASE in the last 168 hours. No results for input(s): AMMONIA in the last 168 hours.  CBC:  Recent Labs Lab 04/29/14 1400 04/30/14 0545 05/02/14 0550 05/05/14 0556  WBC 16.0* 11.3* 12.6* 11.9*  HGB 11.2* 10.4* 10.6* 13.1  HCT 35.4* 34.1* 34.5* 41.9  MCV 71.2* 72.1* 72.8* 72.7*  PLT 492* 473* 532* 661*   Cardiac Enzymes: No results for input(s): CKTOTAL, CKMB, CKMBINDEX, TROPONINI in the last 168 hours. BNP (last 3 results) No results for input(s): BNP in the last 8760  hours.  ProBNP (last 3 results) No results for input(s): PROBNP in the last 8760 hours.  CBG: No results for input(s): GLUCAP in the last 168 hours.  No results found for this or any previous visit (from the past 240 hour(s)).   Studies: No results found.  Scheduled Meds: . budesonide  9 mg Oral Daily  . feeding supplement (RESOURCE BREEZE)  1 Container Oral TID BM  . methylPREDNISolone (SOLU-MEDROL) injection  60 mg Intravenous Q12H  . mirtazapine  7.5 mg Oral QHS  . venlafaxine XR  37.5 mg Oral Q breakfast   Continuous Infusions:     Time spent: 25 minutes    Manuella Blackson  [facility] Pager [phone]. If 7PM-7AM, please contact night-coverage at [email or web address], password [facility] 05/05/2014, 3:52 PM  LOS: 6 days

## 2014-05-06 DIAGNOSIS — K5090 Crohn's disease, unspecified, without complications: Secondary | ICD-10-CM

## 2014-05-06 MED ORDER — METHYLPREDNISOLONE SODIUM SUCC 125 MG IJ SOLR
60.0000 mg | INTRAMUSCULAR | Status: DC
  Administered 2014-05-07 – 2014-05-08 (×2): 60 mg via INTRAVENOUS
  Filled 2014-05-06 (×3): qty 0.96

## 2014-05-06 MED ORDER — TUBERCULIN PPD 5 UNIT/0.1ML ID SOLN
5.0000 [IU] | Freq: Once | INTRADERMAL | Status: AC
  Administered 2014-05-06: 5 [IU] via INTRADERMAL
  Filled 2014-05-06: qty 0.1

## 2014-05-06 NOTE — Progress Notes (Signed)
Pt TB test administered today. Please check in 48 hours.

## 2014-05-06 NOTE — Consult Note (Signed)
Psychiatry Consult follow-up note  Reason for Consult:  Depression and crohn's disease Referring Physician:  Dr. Waymon Amato  Patient Identification: Troy Ramirez MRN:  [id number] Principal Diagnosis: Depression Diagnosis:   Patient Active Problem List   Diagnosis Date Noted  . Depression [F32.9] 05/03/2014  . 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 Hematochezia [K92.1] 05/01/2014  . Abdominal pain, acute [R10.0]   . 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 Noncompliance with medication regimen [Z91.14] 04/29/2014  . Crohn's colitis [K50.10] 04/29/2014  . Syncope [R55] 04/20/2014  . Crohn's disease [K50.90] 04/19/2014  . Malnutrition of moderate degree [E44.0] 04/13/2014  . Hypokalemia [E87.6] 04/11/2014  . Exacerbation of Crohn's disease [K50.90] 04/11/2014  . Leukocytosis [D72.829]   . Anemia of chronic disease [D63.8]   . Abdominal pain [R10.9] 08/27/2013    Total Time spent with patient: 20 minutes  Subjective:   Troy Ramirez is a 29 y.o. male patient admitted with depression and crohn's disease.  HPI:  [name] is a 29 y.o. Male seen, chart reviewed and case discussed with Unk Lightning, LCSW for psychiatric consultation and evaluation of depression secondary to chronic medical condition and recently increased psychosocial stresses. Patient reported he lost his job on 04/11/2014 after working 3 years which caused financial difficulties, reportedly stressed from work and also has a issues with relationship. Patient reported he has been suffering with multiple flareups of chronic disease of Crohn's disease. Patient is stressed about losing his health insurance and paying for his care now on the near future. Patient stated he was given Ativan which helped him to calm down without having any chest tightness. Patient endorses feeling depression, sad, loss of interest, feeling isolated and helpless with his current psychosocial situation and also has  disturbance of sleep and appetite. Patient reported he has anxiety which is causing chest tightness. Patient denied previous history of acute psychiatric hospitalization or outpatient treatment. Patient has denied current suicidal, homicidal ideation, intention or plans. Patient has no evidence of psychosis. He is scheduled psychiatric evaluation as outpatient on 05/10/2014. Patient is willing to take medication for helping his depression, anxiety and disturbance of sleep and appetite with the low motivation.  Interval note: Patient seen today for psychiatric consultation follow-up today. Patient has been doing well emotionally without symptoms of depression and anxiety. He has been compliant with medication as prescribed and has no side effects. Patient slept well last night and has plans of advancing his diet soon. Patient seen sitting on his bed and browsing Internet on his ex-box which is borrowed from his brother. Patient denied any stomach complaints this morning.   Medical history: Patient with past medical history of Crohn's disease recently discharged from the hospital for Crohn's exacerbation on 04/16/2014 on a steroid taper, he was tapered pretty quickly he went to his GI doctor complaining of lower extremity weakness abdominal pain and diarrhea bloody diarrhea so he was directly admitted to [facility]. He relates that since he was discharged she is at ongoing diarrhea, when he finishes steroid treatment he started having more perfuse diarrhea with some blood on it. He's also complaining of full body tingling and somebody grabbing onto his ankles. He relates pain diffusely throughout his body nothing makes it better or worse.   Past Medical History:  Past Medical History  Diagnosis Date  . Crohn disease   . Anemia     required transfusion of bood in 2008 or 2009  . 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   . Internal hemorrhoids      Past Surgical History  Procedure Laterality Date  . Neg hx     Family History:  Family History  Problem Relation Age of Onset  . Diabetes      Both sides  . Heart disease      both sides  . Breast cancer Maternal Grandmother   . Prostate cancer Paternal Grandfather   . Colon cancer Neg Hx   . Diabetes Mellitus II Mother   . Hypertension Father   . Diabetes Mellitus II Father    Social History:  History  Alcohol Use  . Yes    Comment: rarely     History  Drug Use  . Yes  . Special: Marijuana    History   Social History  . Marital Status: Single    Spouse Name: N/A  . Number of Children: 0  . Years of Education: N/A   Occupational History  . manager    Social History Main Topics  . Smoking status: Never Smoker   . Smokeless tobacco: Never Used  . Alcohol Use: Yes     Comment: rarely  . Drug Use: Yes    Special: Marijuana  . Sexual Activity: Yes   Other Topics Concern  . None   Social History Narrative   Additional Social History:       Allergies:   Allergies  Allergen Reactions  . Humira [Adalimumab] Anaphylaxis  . Remicade [Infliximab] Anaphylaxis and Swelling    Vitals: Blood pressure 120/80, pulse 67, temperature 97.4 F (36.3 C), temperature source Oral, resp. rate 18, SpO2 100 %.  Risk to Self: Is patient at risk for suicide?: No Risk to Others:   Prior Inpatient Therapy:   Prior Outpatient Therapy:    Current Facility-Administered Medications  Medication Dose Route Frequency Provider Last Rate Last Dose  . budesonide (ENTOCORT EC) 24 hr capsule 9 mg  9 mg Oral Daily Meryl Dare, MD   9 mg at 05/05/14 0925  . famotidine (PEPCID) tablet 20 mg  20 mg Oral BID PRN Leda Gauze, NP   20 mg at 05/05/14 0930  . feeding supplement (RESOURCE BREEZE) (RESOURCE BREEZE) liquid 1 Container  1 Container Oral TID BM Marinda Elk, MD   1 Container at 05/05/14 2006  . hydrALAZINE (APRESOLINE) tablet 25 mg  25 mg Oral Q6H PRN  Nishant Dhungel, MD      . HYDROmorphone (DILAUDID) injection 2 mg  2 mg Intravenous Q4H PRN Jerald Kief, MD   2 mg at 05/06/14 0607  . LORazepam (ATIVAN) tablet 1 mg  1 mg Oral TID PRN Marinda Elk, MD   1 mg at 05/06/14 0807  . methylPREDNISolone sodium succinate (SOLU-MEDROL) 125 mg/2 mL injection 60 mg  60 mg Intravenous Q12H Meryl Dare, MD   60 mg at 05/06/14 0609  . mirtazapine (REMERON) tablet 7.5 mg  7.5 mg Oral QHS Nehemiah Settle, MD   7.5 mg at 05/05/14 2102  . ondansetron (ZOFRAN) tablet 4 mg  4 mg Oral Q6H PRN Marinda Elk, MD       Or  . ondansetron [facility]) injection 4 mg  4 mg Intravenous Q6H PRN Marinda Elk, MD   4 mg at 05/03/14 0442  . venlafaxine XR (EFFEXOR-XR) 24 hr capsule 37.5 mg  37.5 mg Oral Q breakfast Nehemiah Settle, MD   37.5 mg at 05/05/14  [id number]    Musculoskeletal: Strength & Muscle Tone: within normal limits Gait & Station: normal Patient leans: N/A  Psychiatric Specialty Exam: Physical Exam as per history and physical   ROS depression anxious and stressed out   Blood pressure 120/80, pulse 67, temperature 97.4 F (36.3 C), temperature source Oral, resp. rate 18, SpO2 100 %.There is no weight on file to calculate BMI.  General Appearance: Casual  Eye Contact::  Good  Speech:  Clear and Coherent  Volume:  Normal  Mood:  Anxious and Depressed  Affect:  Appropriate and Congruent  Thought Process:  Coherent and Goal Directed  Orientation:  Full (Time, Place, and Person)  Thought Content:  WDL  Suicidal Thoughts:  No  Homicidal Thoughts:  No  Memory:  Immediate;   Good Recent;   Good  Judgement:  Intact  Insight:  Good  Psychomotor Activity:  Restlessness  Concentration:  Good  Recall:  Good  Fund of Knowledge:Good  Language: Good  Akathisia:  NA  Handed:  Right  AIMS (if indicated):     Assets:  Communication Skills Desire for Improvement Housing Intimacy Leisure Time Resilience Social  Support Talents/Skills Transportation  ADL's:  Intact  Cognition: WNL  Sleep:      Medical Decision Making: Review of Psycho-Social Stressors (1), Review or order clinical lab tests (1), Established Problem, Worsening (2), New Problem, with no additional work-up planned (3), Review or order medicine tests (1), Review of Medication Regimen & Side Effects (2) and Review of New Medication or Change in Dosage (2)  Treatment Plan Summary: Daily contact with patient to assess and evaluate symptoms and progress in treatment and Medication management  Plan:  Continue Effexor XL 37.5 mg daily for depression which can be titrated to 75 mg if clinically required Continue Remeron 7.5 mg at bedtime for insomnia and appetite Patient does not meet criteria for psychiatric inpatient admission. Supportive therapy provided about ongoing stressors.  Appreciate psychiatric consultation and will sign off at this time Please contact [phone] or [phone] if needs further assistance  Disposition: Patient will be referred to the outpatient psychiatric services when medically stable.   Radiance Deady,JANARDHAHA R. 05/06/2014 9:25 AM

## 2014-05-06 NOTE — Progress Notes (Signed)
Clinical Social Work  CSW followed up with patient at bedside to offer support during hospitalization. Patient reports he had a good weekend and feels that medication is helping. Patient reports that he has been compliant with Remeron and feels it has helped with him falling asleep faster and giving him more of an appetite. Patient reports that he has had visitors over the weekend including his girlfriend's mother. Patient was not getting along with girlfriend's parents and mom had voiced several times that she did not want patient to date her dtr. Girlfriend's mother came to visit him in the hospital and patient reports they are now on good terms and patient wants to work on his relationship with them since he plans on staying in a LT relationship with girlfriend. Patient spoke about his mother visiting and having more hope for the future. Patient plans on applying for unemployment or disability and feels confident that he will be approved. Patient reports he is trying to remain positive and focus on making changes in the future.  CSW will continue to follow to provide support throughout hospital stay.  [place], [place] [phone]

## 2014-05-06 NOTE — Progress Notes (Signed)
Patient ID: Troy Ramirez, male   DOB: Mar 15, 1985, 29 y.o.   MRN: [id number]    Progress Note   Subjective  Feeling better, wants to advance diet, less diarrhea 2-3 per day. Pain under control. Feels better from mental health perspective-still not sleeping but has been on very high dose steroids.Troy Ramirez he is going to apply for disability   Objective   Vital signs in last 24 hours: Temp:  [97.4 F (36.3 C)-98 F (36.7 C)] 97.4 F (36.3 C) (02/29 0506) Pulse Rate:  [67-90] 67 (02/29 0506) Resp:  [18] 18 (02/29 0506) BP: (120-157)/(80-122) 120/80 mmHg (02/29 0506) SpO2:  [99 %-100 %] 100 % (02/29 0506) Last BM Date: 05/05/14 General:    White male in NAD Heart:  Regular rate and rhythm; no murmurs Lungs: Respirations even and unlabored, lungs CTA bilaterally Abdomen:  Soft, tender and nondistended. Normal bowel sounds. Extremities:  Without edema. Neurologic:  Alert and oriented,  grossly normal neurologically. Psych:  Cooperative. Normal mood and affect.  Intake/Output from previous day: 02/28 0701 - 02/29 0700 In: 960 [P.O.:960] Out: 1700 [Urine:1700] Intake/Output this shift:    Lab Results:  Recent Labs  05/05/14 0556  WBC 11.9*  HGB 13.1  HCT 41.9  PLT 661*   BMET  Recent Labs  05/05/14 0556  NA 135  K 3.4*  CL 93*  CO2 30  GLUCOSE 95  BUN 12  CREATININE 0.70  CALCIUM 9.3   LFT No results for input(s): PROT, ALBUMIN, AST, ALT, ALKPHOS, BILITOT, BILIDIR, IBILI in the last 72 hours. PT/INR No results for input(s): LABPROT, INR in the last 72 hours.  Studies/Results: No results found.     Assessment / Plan:   #1  29 yo male with Crohns ileocolitis with exacerbation .  He is improving. Will advance diet Has been on 60 solumedrol BID!-will decrease to once daily, and stop Entocort-no added benefit and expensive He did not fail Humira or Cimzia- stopped because insurance would not cover- I think should restart Humira , and do induction dosing. Will  check TB test, has had hepatitis testing in past  He will likely be able to get Humira with Pt assistance if no insurance. We are also in process of getting him appt at [facility] . #2 anxiety- on ativan #3 Chronic pain, arthropathy- does fine as long as has pain meds... #4 Depression - now on Effexor. Hopefully can be discharged in next few days- will need to stay on steroids until can restart a biologic  Principal Problem:   Depression Active Problems:   Abdominal pain   Exacerbation of Crohn's disease   Malnutrition of moderate degree   Crohn's disease   Crohn's colitis   Abdominal pain, acute   Crohn's ileocolitis   Hematochezia     LOS: 7 days   Troy Ramirez  05/06/2014, 9:54 AM   GI Attending Note  I have personally taken an interval history, reviewed the chart, and examined the patient.  I agree with the extender's note, impression and recommendations. He can be discharged once he tolerates a full diet.   Barbette Hair. Arlyce Dice, MD, [facility] [phone]

## 2014-05-06 NOTE — Progress Notes (Signed)
[facility] PROGRESS NOTE  Troy Ramirez ZOX:[id number] DOB: September 03, 1985 DOA: 04/29/2014 PCP: Troy Paula, MD  brief narrative 29 year old male with Crohn's ileocolitis, untreated over last 6 months, recent DC from hospital for Crohn's flare on 04/16/14 and now with worsening abdominal pain, hematochezia. CTscan suggests active disease in colon and thickening of TI. GI managing Crohn's flare.   Assessment/Plan: Exacerbations of Crohn's disease Patient started on IV solu-Medrol , kept nothing by mouth and Entocort added. Abdominal pain improving.. No bleeding. -could not stay on humira or  Cimzia due to cost.   GI office trying to arrange follow-up with IBD specialist at [facility]. GI pathogen panel PCR was Negative. -reduced solumedrol to once daily. entocort Dced as not benefiting much. Advanced diet to soft. -Gi working to arrange assistance for  [employer] as outpt. TB testing ordered.  Chronic abdominal pain:  Related to underlying Crohn's. Continue current pain regimen.  Moderate protein calorie malnutrition: Secondary to underlying Crohn's.  Added supplements.  Depression:  On 2/25 patient was emotional, upset and tearful regarding multiple issues-losing job, pending loss of insurance on March 5, worries that he will not have access to care for his health issues, sister committed suicide, one brother has CP and is ill. Psychiatry consulted and patient was started patient on Remeron and Effexor. Symptoms much improved.  Anemia:  Iron deficiency. Currently stable  Leukocytosis: Secondary to steroids.   Code Status: Full code Family Communication: none at bedside Disposition Plan: Home once able to tolerate advanced diet.   Consultants:  GI ( Troy Ramirez covering for Troy Ramirez)  Procedures:  ET scan of the abdomen  Antibiotics:  None  HPI/Subjective: Seen and examined. Abdominal pain better but reports gaseous distention.Marland Kitchentolerated soft diet today.   Objective: Filed  Vitals:   05/06/14 0506  BP: 120/80  Pulse: 67  Temp: 97.4 F (36.3 C)  Resp: 18    Intake/Output Summary (Last 24 hours) at 05/06/14 1245 Last data filed at 05/05/14 1850  Gross per 24 hour  Intake    720 ml  Output   1200 ml  Net   -480 ml   There were no vitals filed for this visit.  Exam:   General:  no acute distress  HEENT: Pallor present, moist oral mucosa,   Chest: Clear to auscultation  CVS: S1 and S2, no murmurs rub or gallop  GI: Soft, nondistended, minimal tenderness, bowel sounds present  musculoskeletal: Warm, no edema  Data Reviewed: Basic Metabolic Panel:  Recent Labs Lab 04/29/14 1400 04/30/14 0545 05/02/14 0550 05/05/14 0556  NA 140 138 139 135  K 3.6 4.1 3.9 3.4*  CL 106 105 103 93*  CO2 GLUCOSE 99 129* 113* 95  BUN CREATININE 0.77 0.65 0.71 0.70  CALCIUM 9.7 9.2 9.1 9.3   Liver Function Tests:  Recent Labs Lab 04/29/14 1400  AST 18  ALT 14  ALKPHOS 38*  BILITOT 1.0  PROT 8.2  ALBUMIN 4.0   No results for input(s): LIPASE, AMYLASE in the last 168 hours. No results for input(s): AMMONIA in the last 168 hours. CBC:  Recent Labs Lab 04/29/14 1400 04/30/14 0545 05/02/14 0550 05/05/14 0556  WBC 16.0* 11.3* 12.6* 11.9*  HGB 11.2* 10.4* 10.6* 13.1  HCT 35.4* 34.1* 34.5* 41.9  MCV 71.2* 72.1* 72.8* 72.7*  PLT 492* 473* 532* 661*   Cardiac Enzymes: No results for input(s): CKTOTAL, CKMB, CKMBINDEX, TROPONINI in the last 168 hours. BNP (  last 3 results) No results for input(s): BNP in the last 8760 hours.  ProBNP (last 3 results) No results for input(s): PROBNP in the last 8760 hours.  CBG: No results for input(s): GLUCAP in the last 168 hours.  No results found for this or any previous visit (from the past 240 hour(s)).   Studies: No results found.  Scheduled Meds: . feeding supplement (RESOURCE BREEZE)  1 Container Oral TID BM  . [START ON 05/07/2014] methylPREDNISolone (SOLU-MEDROL)  injection  60 mg Intravenous Q24H  . mirtazapine  7.5 mg Oral QHS  . tuberculin  5 Units Intradermal Once  . venlafaxine XR  37.5 mg Oral Q breakfast   Continuous Infusions:     Time spent: 25 minutes    Troy Ramirez  [facility] Pager [phone]. If 7PM-7AM, please contact night-coverage at [email or web address], password [email or web address] 05/06/2014, 12:45 PM  LOS: 7 days

## 2014-05-07 DIAGNOSIS — I10 Essential (primary) hypertension: Secondary | ICD-10-CM

## 2014-05-07 MED ORDER — HYDROMORPHONE HCL 1 MG/ML IJ SOLN
1.0000 mg | INTRAMUSCULAR | Status: DC | PRN
  Administered 2014-05-07 – 2014-05-08 (×7): 1 mg via INTRAVENOUS
  Filled 2014-05-07 (×7): qty 1

## 2014-05-07 MED ORDER — HYDROXYZINE HCL 50 MG PO TABS
50.0000 mg | ORAL_TABLET | Freq: Every evening | ORAL | Status: DC | PRN
  Administered 2014-05-07: 50 mg via ORAL
  Filled 2014-05-07 (×4): qty 1
  Filled 2014-05-07: qty 2

## 2014-05-07 MED ORDER — HYDROCHLOROTHIAZIDE 12.5 MG PO CAPS
12.5000 mg | ORAL_CAPSULE | Freq: Every day | ORAL | Status: DC
  Administered 2014-05-07 – 2014-05-08 (×2): 12.5 mg via ORAL
  Filled 2014-05-07 (×2): qty 1

## 2014-05-07 MED ORDER — UNJURY CHOCOLATE CLASSIC POWDER
2.0000 [oz_av] | Freq: Three times a day (TID) | ORAL | Status: DC
Start: 2014-05-07 — End: 2014-05-08
  Administered 2014-05-07 – 2014-05-08 (×3): 2 [oz_av] via ORAL
  Filled 2014-05-07 (×7): qty 27

## 2014-05-07 MED ORDER — VENLAFAXINE HCL ER 75 MG PO CP24
75.0000 mg | ORAL_CAPSULE | Freq: Every day | ORAL | Status: DC
  Administered 2014-05-08: 75 mg via ORAL
  Filled 2014-05-07 (×2): qty 1

## 2014-05-07 MED ORDER — DICYCLOMINE HCL 10 MG PO CAPS
10.0000 mg | ORAL_CAPSULE | Freq: Three times a day (TID) | ORAL | Status: DC
  Administered 2014-05-07 – 2014-05-08 (×4): 10 mg via ORAL
  Filled 2014-05-07 (×6): qty 1

## 2014-05-07 MED ORDER — OXYCODONE-ACETAMINOPHEN 5-325 MG PO TABS
2.0000 | ORAL_TABLET | ORAL | Status: DC | PRN
  Administered 2014-05-07 – 2014-05-08 (×6): 2 via ORAL
  Filled 2014-05-07 (×6): qty 2

## 2014-05-07 NOTE — Progress Notes (Signed)
Patient ID: Troy Ramirez, male   DOB: [birth year], 29 y.o.   MRN: [id number]    Progress Note   Subjective  Still c/o a lot of cramping and gas after eating which is painful, eating mostly full liquids. Does not want to go home until he is well enogh to be off pain meds...   Objective   Vital signs in last 24 hours: Temp:  [98.3 F (36.8 C)-98.4 F (36.9 C)] 98.3 F (36.8 C) (03/01 4888) Pulse Rate:  [88-100] 88 (03/01 0614) Resp:  [20] 20 (03/01 0614) BP: (141-160)/(87-100) 158/99 mmHg (03/01 0614) SpO2:  [99 %-100 %] 99 % (03/01 0614) Last BM Date: 05/06/14 General:   Thin White male in NAD Heart:  Regular rate and rhythm; no murmurs Lungs: Respirations even and unlabored, lungs CTA bilaterally Abdomen:  Soft, tender  and nondistended. Normal bowel sounds. Extremities:  Without edema. Neurologic:  Alert and oriented,  grossly normal neurologically. Psych:  Cooperative. Normal mood and affect.  Intake/Output from previous day: 02/29 0701 - 03/01 0700 In: 600 [P.O.:600] Out: -  Intake/Output this shift:    Lab Results:  Recent Labs  05/05/14 0556  WBC 11.9*  HGB 13.1  HCT 41.9  PLT 661*   BMET  Recent Labs  05/05/14 0556  NA 135  K 3.4*  CL 93*  CO2 30  GLUCOSE 95  BUN 12  CREATININE 0.70  CALCIUM 9.3   LFT No results for input(s): PROT, ALBUMIN, AST, ALT, ALKPHOS, BILITOT, BILIDIR, IBILI in the last 72 hours. PT/INR No results for input(s): LABPROT, INR in the last 72 hours.       Assessment / Plan:   #1 29 yo male  With Crohns ileocolitis poorly controlled- improving on Solumedrol -currently 60 mg daily,  He needs a Biologic and did well with Humira previously- may get him started  on induction here if we cn get pt assistance outpt. Add bentyl TID ac Will defer to hospitalist to gradually wean narcotics #2 IBD related arthropathy #3 anxiety /depression - improving on current regimen  Principal Problem:   Depression Active Problems:  Abdominal pain   Exacerbation of Crohn's disease   Malnutrition of moderate degree   Crohn's disease   Crohn's colitis   Abdominal pain, acute   Crohn's ileocolitis   Hematochezia     LOS: 8 days   [name]  05/07/2014, 9:41 AM   GI Attending Note  I have personally taken an interval history, reviewed the chart, and examined the patient.  I agree with the extender's note, impression and recommendations.  He is still having abdominal pain.  I encouraged him to pulled back somewhat on his diet.  Cannot discharge and total pain is under better control.  Ideally he will begin biologic therapy prior to discharge.  Barbette Hair. Arlyce Dice, MD, [facility] [phone]

## 2014-05-07 NOTE — Progress Notes (Addendum)
[facility] PROGRESS NOTE  Karla Vines ONG:[id number] DOB: [birth year] DOA: 04/29/2014 PCP: Lora Paula, MD  brief narrative 29 year old male with Crohn's ileocolitis, untreated over last 6 months, recent DC from hospital for Crohn's flare on 04/16/14 and now with worsening abdominal pain, hematochezia. CTscan suggests active disease in colon and thickening of TI. GI managing Crohn's flare.   Assessment/Plan: Exacerbations of Crohn's disease Patient started on IV solu-Medrol , kept nothing by mouth and Entocort added. Abdominal pain slowly improving. Still feels gaseous distention periodically. Trying to wean narcotics. Will start prn percocet alternating with low dose dilaudid as needed. No bleeding. -could not stay on humira or  Cimzia due to cost.   GI office trying to arrange follow-up with IBD specialist at [facility]. GI pathogen panel PCR was Negative. -reduced solumedrol to once daily. entocort Dced as not benefiting much. Advanced diet to soft. -Gi working to arrange assistance for  [employer] as outpt. TB testing placed on 2/29. continue soft diet. Still feels gaseous after meals. Will need outpt pain clinic referral. [facility]  Chronic abdominal pain:  Related to underlying Crohn's. Continue current pain regimen. continue bentyl.  Moderate protein calorie malnutrition: Secondary to underlying Crohn's.  Added supplements.  Depression:  regarding multiple issues-losing job, pending loss of insurance ., worries that he will not have access to care for his health issues, sister committed suicide, one brother has CP and is ill. Psychiatry consulted and patient was started patient on Remeron and Effexor. Symptoms much improved.  Anemia:  Iron deficiency. Currently stable  Essential HTN  reports being on norvasc previously. BP elevated. Added low dose HCTZ.   Leukocytosis: Secondary to steroids.    Code Status: Full code Family Communication:  none at bedside Disposition Plan: Home once able to tolerate advanced diet and pain better controlled. May take another 1-2 days   Consultants: [facility]  Procedures:  CT scan of the abdomen  Antibiotics:  None  HPI/Subjective: Seen and examined. Still  reports gaseous distention requiring pain meds.Marland Kitchentolerating soft diet   Objective: Filed Vitals:   05/07/14 1015  BP: 150/86  Pulse: 107  Temp:   Resp: 20    Intake/Output Summary (Last 24 hours) at 05/07/14 1046 Last data filed at 05/07/14 0948  Gross per 24 hour  Intake    832 ml  Output      0 ml  Net    832 ml   There were no vitals filed for this visit.  Exam:   General:  Thin built male in no acute distress  HEENT: Pallor present, moist oral mucosa,   Chest: Clear to auscultation  CVS: S1 and S2, no murmurs rub or gallop  GI: Soft, nondistended, mid abdominal tenderness, bowel sounds present  musculoskeletal: Warm, no edema  Data Reviewed: Basic Metabolic Panel:  Recent Labs Lab 05/02/14 0550 05/05/14 0556  NA 139 135  K 3.9 3.4*  CL 103 93*  CO2 29 30  GLUCOSE 113* 95  BUN 13 12  CREATININE 0.71 0.70  CALCIUM 9.1 9.3   Liver Function Tests: No results for input(s): AST, ALT, ALKPHOS, BILITOT, PROT, ALBUMIN in the last 168 hours. No results for input(s): LIPASE, AMYLASE in the last 168 hours. No results for input(s): AMMONIA in the last 168 hours. CBC:  Recent Labs Lab 05/02/14 0550 05/05/14 0556  WBC 12.6* 11.9*  HGB 10.6* 13.1  HCT 34.5* 41.9  MCV 72.8* 72.7*  PLT 532* 661*   Cardiac  Enzymes: No results for input(s): CKTOTAL, CKMB, CKMBINDEX, TROPONINI in the last 168 hours. BNP (last 3 results) No results for input(s): BNP in the last 8760 hours.  ProBNP (last 3 results) No results for input(s): PROBNP in the last 8760 hours.  CBG: No results for input(s): GLUCAP in the last 168 hours.  No results found for this or any previous visit (from the past 240 hour(s)).    Studies: No results found.  Scheduled Meds: . dicyclomine  10 mg Oral TID AC  . feeding supplement (RESOURCE BREEZE)  1 Container Oral TID BM  . hydrochlorothiazide  12.5 mg Oral Daily  . methylPREDNISolone (SOLU-MEDROL) injection  60 mg Intravenous Q24H  . mirtazapine  7.5 mg Oral QHS  . tuberculin  5 Units Intradermal Once  . venlafaxine XR  37.5 mg Oral Q breakfast   Continuous Infusions:     Time spent: 25 minutes    Ernie Sagrero  [facility] Pager [phone]. If 7PM-7AM, please contact night-coverage at [email or web address], password [facility] 05/07/2014, 10:46 AM  LOS: 8 days

## 2014-05-07 NOTE — Progress Notes (Signed)
NUTRITION FOLLOW UP  Intervention:   - Continue Resource Breeze TID - Unjury protein powder TID- pt to mix with Lactaid milk - RD will continue to monitor  Nutrition Dx:   Inadequate oral intake related to diarrhea and abdominal pain as evidenced by pt reported poor PO intake and 14 % weight loss in past 6 months; ongoing  Goal:   Pt to meet >/= 90% of their estimated nutrition needs; improving  Monitor:   PO intake, weight trends, labs, bm's  Assessment:   2/23: Pt hx of Chron's disease recently discharged from the hospital for Chron's exacerbation on steroid taper. Pt reports ongoing diarrhea and lower extremity weakness since discharge. History of non-compliance.   Pt reports weight loss in the past year, is usually 180 lbs. Pt has lost 14% of body weight in the past year per weight records (significant for time frame). Bloody diarrhea has been going on for the past two months. Pt reports he has not eaten anything in the past two weeks, prior to that he reports any food or liquids are painful to ingest so he stopped eating or drinking anything.   3/1: - Pt po starting to improve. He is doing mostly full liquids such as grits and Lactaid milk. He also does ok with bananas and yogurt. Pt has been drinking Resource Breeze supplements, but says that they give him gas and some stomach pain. He drinks them mostly at night. At home pt drinks protein shakes normally. Will order protein powder supplements for pt to try.  - Pt on Remeron.   - Labs reviewed. K low  Height: Ht Readings from Last 1 Encounters:  04/29/14  (1.905 m)    Weight Status:   Wt Readings from Last 1 Encounters:  04/29/14 155 lb 6.4 oz (70.489 kg)    Re-estimated needs:  Kcal: 2100-2300 Protein: 85-100 g Fluid: 2.3 L/day  Skin: Intact  Diet Order: DIET SOFT   Intake/Output Summary (Last 24 hours) at 05/07/14 1108 Last data filed at 05/07/14 0948  Gross per 24 hour  Intake    832 ml  Output       0 ml  Net    832 ml    Last BM: 2/24   Labs:   Recent Labs Lab 05/02/14 0550 05/05/14 0556  NA 139 135  K 3.9 3.4*  CL 103 93*  CO2 29 30  BUN 13 12  CREATININE 0.71 0.70  CALCIUM 9.1 9.3  GLUCOSE 113* 95    CBG (last 3)  No results for input(s): GLUCAP in the last 72 hours.  Scheduled Meds: . dicyclomine  10 mg Oral TID AC  . feeding supplement (RESOURCE BREEZE)  1 Container Oral TID BM  . hydrochlorothiazide  12.5 mg Oral Daily  . methylPREDNISolone (SOLU-MEDROL) injection  60 mg Intravenous Q24H  . mirtazapine  7.5 mg Oral QHS  . tuberculin  5 Units Intradermal Once  . venlafaxine XR  37.5 mg Oral Q breakfast    Continuous Infusions:   Emmaline Kluver MS, RD, LDN [phone]

## 2014-05-07 NOTE — Progress Notes (Signed)
Clinical Social Work  CSW met with patient at bedside along with psych MD. Patient reports he is feeling overwhelmed about leaving the hospital and trying to get affairs in order. Patient is hopeful for unemployment or disability approval due to the inability to work at [employer]. Patient reports good family support and he might live with mom so he could return to school. Patient is very concerned about his pain and reports he wants adequate follow up for [facility] at [facility]. Patient has information for MH follow up at [facility] and CSW will continue to follow to provide support throughout hospitalization.  Psych MD addressed medication concerns and will continue to manage any changes needed. Patient reports Remeron has been helpful and assisted with sleep while hospitalized.  [place], [place] [phone]

## 2014-05-07 NOTE — Consult Note (Signed)
Psychiatry Consult follow-up note  Reason for Consult:  Depression and crohn's disease Referring Physician:  Dr. Waymon Amato  Patient Identification: Noe Glod MRN:  [id number] Principal Diagnosis: Depression Diagnosis:   Patient Active Problem List   Diagnosis Date Noted  . Depression [F32.9] 05/03/2014  . 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 Hematochezia [K92.1] 05/01/2014  . Abdominal pain, acute [R10.0]   . 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 Noncompliance with medication regimen [Z91.14] 04/29/2014  . Crohn's colitis [K50.10] 04/29/2014  . Syncope [R55] 04/20/2014  . Crohn's disease [K50.90] 04/19/2014  . Malnutrition of moderate degree [E44.0] 04/13/2014  . Hypokalemia [E87.6] 04/11/2014  . Exacerbation of Crohn's disease [K50.90] 04/11/2014  . Leukocytosis [D72.829]   . Anemia of chronic disease [D63.8]   . Abdominal pain [R10.9] 08/27/2013    Total Time spent with patient: 20 minutes  Subjective:   Desai Apa is a 29 y.o. male patient admitted with depression and crohn's disease.  HPI:  Lanny Vonwald is a 29 y.o. Male seen, chart reviewed and case discussed with Unk Lightning, LCSW for psychiatric consultation and evaluation of depression secondary to chronic medical condition and recently increased psychosocial stresses. Patient reported he lost his job on 04/11/2014 after working 3 years which caused financial difficulties, reportedly stressed from work and also has a issues with relationship. Patient reported he has been suffering with multiple flareups of chronic disease of Crohn's disease. Patient is stressed about losing his health insurance and paying for his care now on the near future. Patient stated he was given Ativan which helped him to calm down without having any chest tightness. Patient endorses feeling depression, sad, loss of interest, feeling isolated and helpless with his current psychosocial situation and also has  disturbance of sleep and appetite. Patient reported he has anxiety which is causing chest tightness. Patient denied previous history of acute psychiatric hospitalization or outpatient treatment. Patient has denied current suicidal, homicidal ideation, intention or plans. Patient has no evidence of psychosis. He is scheduled psychiatric evaluation as outpatient on 05/10/2014. Patient is willing to take medication for helping his depression, anxiety and disturbance of sleep and appetite with the low motivation.  Interval note: Patient complained of increased appetite but has stomach pain when he eats. He also complained of increased anxiety and stress of psychosocial and financial issues. Reportedly he has disturbed sleep last night and staying in his bed. Patient was informed that he needs to notify to his staff RN if he feels need to walk in hallway. He has been compliant with medication as prescribed, says helpful to him and has no side effects. Patient has plans of advancing his diet soon. Patient complaints of stomach pain when he eats solid food and want to fix his pain and Crohn's flare up prior to be discharged. Patient has agenda to stay in hospital until the end of week and seems like has secondary gains at this time.   Medical history: Patient with past medical history of Crohn's disease recently discharged from the hospital for Crohn's exacerbation on 04/16/2014 on a steroid taper, he was tapered pretty quickly he went to his GI doctor complaining of lower extremity weakness abdominal pain and diarrhea bloody diarrhea so he was directly admitted to [facility]. He relates that since he was discharged she is at ongoing diarrhea, when he finishes steroid treatment he started having more perfuse diarrhea with some blood on it. He's also complaining of full body tingling and somebody grabbing onto his  ankles. He relates pain diffusely throughout his body nothing makes it better or worse.   Past  Medical History:  Past Medical History  Diagnosis Date  . Crohn disease   . Anemia     required transfusion of bood in 2008 or 2009  . 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   . Internal hemorrhoids     Past Surgical History  Procedure Laterality Date  . Neg hx     Family History:  Family History  Problem Relation Age of Onset  . Diabetes      Both sides  . Heart disease      both sides  . Breast cancer Maternal Grandmother   . Prostate cancer Paternal Grandfather   . Colon cancer Neg Hx   . Diabetes Mellitus II Mother   . Hypertension Father   . Diabetes Mellitus II Father    Social History:  History  Alcohol Use  . Yes    Comment: rarely     History  Drug Use  . Yes  . Special: Marijuana    History   Social History  . Marital Status: Single    Spouse Name: N/A  . Number of Children: 0  . Years of Education: N/A   Occupational History  . manager    Social History Main Topics  . Smoking status: Never Smoker   . Smokeless tobacco: Never Used  . Alcohol Use: Yes     Comment: rarely  . Drug Use: Yes    Special: Marijuana  . Sexual Activity: Yes   Other Topics Concern  . None   Social History Narrative   Additional Social History:       Allergies:   Allergies  Allergen Reactions  . Humira [Adalimumab] Anaphylaxis  . Remicade [Infliximab] Anaphylaxis and Swelling    Vitals: Blood pressure 150/86, pulse 107, temperature 98.3 F (36.8 C), temperature source Oral, resp. rate 20, SpO2 99 %.  Risk to Self: Is patient at risk for suicide?: No Risk to Others:   Prior Inpatient Therapy:   Prior Outpatient Therapy:    Current Facility-Administered Medications  Medication Dose Route Frequency Provider Last Rate Last Dose  . dicyclomine (BENTYL) capsule 10 mg  10 mg Oral TID AC Amy S Esterwood, PA-C   10 mg at 05/07/14 1143  . famotidine (PEPCID) tablet 20 mg  20 mg Oral BID PRN Leda Gauze, NP    20 mg at 05/06/14 1924  . feeding supplement (RESOURCE BREEZE) (RESOURCE BREEZE) liquid 1 Container  1 Container Oral TID BM Marinda Elk, MD   1 Container at 05/07/14 1017  . hydrALAZINE (APRESOLINE) tablet 25 mg  25 mg Oral Q6H PRN Nishant Dhungel, MD      . hydrochlorothiazide (MICROZIDE) capsule 12.5 mg  12.5 mg Oral Daily Nishant Dhungel, MD   12.5 mg at 05/07/14 1017  . HYDROmorphone (DILAUDID) injection 1 mg  1 mg Intravenous Q4H PRN Nishant Dhungel, MD   1 mg at 05/07/14 1143  . LORazepam (ATIVAN) tablet 1 mg  1 mg Oral TID PRN Marinda Elk, MD   1 mg at 05/07/14 0530  . methylPREDNISolone sodium succinate (SOLU-MEDROL) 125 mg/2 mL injection 60 mg  60 mg Intravenous Q24H Amy S Esterwood, PA-C   60 mg at 05/07/14 0513  . mirtazapine (REMERON) tablet 7.5 mg  7.5 mg Oral QHS Nehemiah Settle, MD   7.5 mg at 05/06/14  2238  . ondansetron (ZOFRAN) tablet 4 mg  4 mg Oral Q6H PRN Marinda Elk, MD       Or  . ondansetron [facility]) injection 4 mg  4 mg Intravenous Q6H PRN Marinda Elk, MD   4 mg at 05/03/14 0442  . oxyCODONE-acetaminophen (PERCOCET/ROXICET) 5-325 MG per tablet 2 tablet  2 tablet Oral Q4H PRN Eddie North, MD   2 tablet at 05/07/14 1349  . protein supplement (UNJURY CHOCOLATE CLASSIC) powder 2 oz  2 oz Oral TID Normand Sloop, RD   2 oz at 05/07/14 1200  . tuberculin injection 5 Units  5 Units Intradermal Once Amy S Esterwood, PA-C   5 Units at 05/06/14 1130  . venlafaxine XR (EFFEXOR-XR) 24 hr capsule 37.5 mg  37.5 mg Oral Q breakfast Nehemiah Settle, MD   37.5 mg at 05/07/14 1610    Musculoskeletal: Strength & Muscle Tone: within normal limits Gait & Station: normal Patient leans: N/A  Psychiatric Specialty Exam: Physical Exam as per history and physical   ROS depression anxious and stressed out   Blood pressure 150/86, pulse 107, temperature 98.3 F (36.8 C), temperature source Oral, resp. rate 20, SpO2 99 %.There is no  weight on file to calculate BMI.  General Appearance: Casual  Eye Contact::  Good  Speech:  Clear and Coherent  Volume:  Normal  Mood:  Anxious and Depressed  Affect:  Appropriate and Congruent  Thought Process:  Coherent and Goal Directed  Orientation:  Full (Time, Place, and Person)  Thought Content:  WDL  Suicidal Thoughts:  No  Homicidal Thoughts:  No  Memory:  Immediate;   Good Recent;   Good  Judgement:  Intact  Insight:  Good  Psychomotor Activity:  Restlessness  Concentration:  Good  Recall:  Good  Fund of Knowledge:Good  Language: Good  Akathisia:  NA  Handed:  Right  AIMS (if indicated):     Assets:  Communication Skills Desire for Improvement Housing Intimacy Leisure Time Resilience Social Support Talents/Skills Transportation  ADL's:  Intact  Cognition: WNL  Sleep:      Medical Decision Making: Review of Psycho-Social Stressors (1), Review or order clinical lab tests (1), Established Problem, Worsening (2), New Problem, with no additional work-up planned (3), Review or order medicine tests (1), Review of Medication Regimen & Side Effects (2) and Review of New Medication or Change in Dosage (2)  Treatment Plan Summary: Daily contact with patient to assess and evaluate symptoms and progress in treatment and Medication management  Plan:  Increase Effexor XL 75 mg daily for depression  Start hydroxyzine 50 mg Qhs and repeat x 1 PRN for insomnia Discontinue Remeron 7.5 mg at bedtime due to increased appetite but not able to eat due to stomach pain  Patient does not meet criteria for psychiatric inpatient admission. Supportive therapy provided about ongoing stressors.  Appreciate psychiatric consultation and will sign off at this time Please contact [phone] or [phone] if needs further assistance  Disposition: Patient will be referred to the outpatient psychiatric services when medically stable.   Kamryn Gauthier,JANARDHAHA R. 05/07/2014 2:42 PM

## 2014-05-07 NOTE — Progress Notes (Signed)
CARE MANAGEMENT NOTE 05/07/2014  Patient:  Troy Ramirez, Troy Ramirez   Account Number:  [id number]  Date Initiated:  04/30/2014  Documentation initiated by:  Ferdinand Cava  Subjective/Objective Assessment:   29 yo male admitted with Crohn's flare from home     Action/Plan:   discharge planning   Anticipated DC Date:  05/02/2014   Anticipated DC Plan:  HOME/SELF CARE      DC Planning Services  CM consult      Choice offered to / List presented to:             Status of service:   Medicare Important Message given?   (If response is "NO", the following Medicare IM given date fields will be blank) Date Medicare IM given:   Medicare IM given by:   Date Additional Medicare IM given:   Additional Medicare IM given by:    Discharge Disposition:  HOME/SELF CARE  Per UR Regulation:    If discussed at Long Length of Stay Meetings, dates discussed:    Comments:  05/07/14 Ferdinand Cava RN BSn CM [phone] Scheduled a follow up appointment for the patient at the [facility] on March 8th, the appointment was placed on the follow up instructions and written on a [facility] pamphlet and provided to the patient.  04/30/14 Ferdinand Cava RN BSn CM [phone] Patient stated that he will lose his insurance coverage in March. He stated that he has already been set up at [facility] and follows with Dr. Armen Pickup. Discussed the financial counselors at the [facility] and encouraged patient to follow up once he is without insurance to check availability of resources once he is uninsured. Also encouarged the patient to follow up with [facility] for affordability of medications.

## 2014-05-08 DIAGNOSIS — K50918 Crohn's disease, unspecified, with other complication: Secondary | ICD-10-CM

## 2014-05-08 MED ORDER — LORAZEPAM 1 MG PO TABS: 1.0000 mg | ORAL_TABLET | Freq: Three times a day (TID) | ORAL | Status: DC | PRN

## 2014-05-08 MED ORDER — FAMOTIDINE 20 MG PO TABS: 20.0000 mg | ORAL_TABLET | Freq: Two times a day (BID) | ORAL | Status: DC | PRN

## 2014-05-08 MED ORDER — SULFASALAZINE 500 MG PO TBEC: 1000.0000 mg | DELAYED_RELEASE_TABLET | Freq: Two times a day (BID) | ORAL | Status: DC

## 2014-05-08 MED ORDER — VENLAFAXINE HCL ER 75 MG PO CP24: 75.0000 mg | ORAL_CAPSULE | Freq: Every day | ORAL | Status: DC

## 2014-05-08 MED ORDER — HYDROXYZINE HCL 50 MG PO TABS: 50.0000 mg | ORAL_TABLET | Freq: Every evening | ORAL | Status: DC | PRN

## 2014-05-08 MED ORDER — HYDROCHLOROTHIAZIDE 12.5 MG PO CAPS: 12.5000 mg | ORAL_CAPSULE | Freq: Every day | ORAL | Status: DC

## 2014-05-08 MED ORDER — OXYCODONE-ACETAMINOPHEN 5-325 MG PO TABS: 2.0000 | ORAL_TABLET | ORAL | Status: DC | PRN

## 2014-05-08 MED ORDER — PREDNISONE 20 MG PO TABS: ORAL_TABLET | ORAL | Status: DC

## 2014-05-08 NOTE — Consult Note (Signed)
Psychiatry Consult follow-up note  Reason for Consult:  Depression and crohn's disease Referring Physician:  Dr. Waymon Amato  Patient Identification: Troy Ramirez MRN:  [id number] Principal Diagnosis: Depression Diagnosis:   Patient Active Problem List   Diagnosis Date Noted  . Depression [F32.9] 05/03/2014  . 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 Hematochezia [K92.1] 05/01/2014  . Abdominal pain, acute [R10.0]   . 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 Noncompliance with medication regimen [Z91.14] 04/29/2014  . Crohn's colitis [K50.10] 04/29/2014  . Syncope [R55] 04/20/2014  . Crohn's disease [K50.90] 04/19/2014  . Malnutrition of moderate degree [E44.0] 04/13/2014  . Hypokalemia [E87.6] 04/11/2014  . Exacerbation of Crohn's disease [K50.90] 04/11/2014  . Leukocytosis [D72.829]   . Anemia of chronic disease [D63.8]   . Abdominal pain [R10.9] 08/27/2013    Total Time spent with patient: 20 minutes  Subjective:   Troy Ramirez is a 29 y.o. male patient admitted with depression and crohn's disease.  HPI:  Troy Ramirez is a 29 y.o. Male seen, chart reviewed and case discussed with Unk Lightning, LCSW for psychiatric consultation and evaluation of depression secondary to chronic medical condition and recently increased psychosocial stresses. Patient reported he lost his job on 04/11/2014 after working 3 years which caused financial difficulties, reportedly stressed from work and also has a issues with relationship. Patient reported he has been suffering with multiple flareups of chronic disease of Crohn's disease. Patient is stressed about losing his health insurance and paying for his care now on the near future. Patient stated he was given Ativan which helped him to calm down without having any chest tightness. Patient endorses feeling depression, sad, loss of interest, feeling isolated and helpless with his current psychosocial situation and also has  disturbance of sleep and appetite. Patient reported he has anxiety which is causing chest tightness. Patient denied previous history of acute psychiatric hospitalization or outpatient treatment. Patient has denied current suicidal, homicidal ideation, intention or plans. Patient has no evidence of psychosis. He is scheduled psychiatric evaluation as outpatient on 05/10/2014. Patient is willing to take medication for helping his depression, anxiety and disturbance of sleep and appetite with the low motivation.  Interval note: Patient has been less anxious today and stated that he is able to tolerate his mediatrion adjustment without side effect. Reportedly he has disturbed sleep last night, woke up after two hours of sleeping and able to go back to sleep. Patient has plans of advancing his diet as much as he can tolerate. Patient has Crohn's flare up which is slowly getting better. His IV narcotics will be changed to oral analgesic today. Patient has agenda to stay in hospital few more days. He has schedule appointment with [facility] out patient.    Medical history: Patient with past medical history of Crohn's disease recently discharged from the hospital for Crohn's exacerbation on 04/16/2014 on a steroid taper, he was tapered pretty quickly he went to his GI doctor complaining of lower extremity weakness abdominal pain and diarrhea bloody diarrhea so he was directly admitted to [facility]. He relates that since he was discharged she is at ongoing diarrhea, when he finishes steroid treatment he started having more perfuse diarrhea with some blood on it. He's also complaining of full body tingling and somebody grabbing onto his ankles. He relates pain diffusely throughout his body nothing makes it better or worse.   Past Medical History:  Past Medical History  Diagnosis Date  . Crohn disease   . Anemia  required transfusion of bood in 2008 or 2009  . 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   . Internal hemorrhoids     Past Surgical History  Procedure Laterality Date  . Neg hx     Family History:  Family History  Problem Relation Age of Onset  . Diabetes      Both sides  . Heart disease      both sides  . Breast cancer Maternal Grandmother   . Prostate cancer Paternal Grandfather   . Colon cancer Neg Hx   . Diabetes Mellitus II Mother   . Hypertension Father   . Diabetes Mellitus II Father    Social History:  History  Alcohol Use  . Yes    Comment: rarely     History  Drug Use  . Yes  . Special: Marijuana    History   Social History  . Marital Status: Single    Spouse Name: N/A  . Number of Children: 0  . Years of Education: N/A   Occupational History  . manager    Social History Main Topics  . Smoking status: Never Smoker   . Smokeless tobacco: Never Used  . Alcohol Use: Yes     Comment: rarely  . Drug Use: Yes    Special: Marijuana  . Sexual Activity: Yes   Other Topics Concern  . None   Social History Narrative   Additional Social History:       Allergies:   Allergies  Allergen Reactions  . Humira [Adalimumab] Anaphylaxis  . Remicade [Infliximab] Anaphylaxis and Swelling    Vitals: Blood pressure 160/94, pulse 89, temperature 97.4 F (36.3 C), temperature source Oral, resp. rate 18, SpO2 100 %.  Risk to Self: Is patient at risk for suicide?: No Risk to Others:   Prior Inpatient Therapy:   Prior Outpatient Therapy:    Current Facility-Administered Medications  Medication Dose Route Frequency Provider Last Rate Last Dose  . dicyclomine (BENTYL) capsule 10 mg  10 mg Oral TID AC Amy S Esterwood, PA-C   10 mg at 05/08/14 0730  . famotidine (PEPCID) tablet 20 mg  20 mg Oral BID PRN Leda Gauze, NP   20 mg at 05/06/14 1924  . feeding supplement (RESOURCE BREEZE) (RESOURCE BREEZE) liquid 1 Container  1 Container Oral TID BM Marinda Elk, MD   1 Container at 05/08/14 1000   . hydrALAZINE (APRESOLINE) tablet 25 mg  25 mg Oral Q6H PRN Nishant Dhungel, MD      . hydrochlorothiazide (MICROZIDE) capsule 12.5 mg  12.5 mg Oral Daily Nishant Dhungel, MD   12.5 mg at 05/08/14 1047  . HYDROmorphone (DILAUDID) injection 1 mg  1 mg Intravenous Q4H PRN Nishant Dhungel, MD   1 mg at 05/08/14 0851  . hydrOXYzine (ATARAX/VISTARIL) tablet 50 mg  50 mg Oral QHS,MR X 1 Nehemiah Settle, MD   50 mg at 05/07/14 2133  . LORazepam (ATIVAN) tablet 1 mg  1 mg Oral TID PRN Marinda Elk, MD   1 mg at 05/08/14 0655  . methylPREDNISolone sodium succinate (SOLU-MEDROL) 125 mg/2 mL injection 60 mg  60 mg Intravenous Q24H Amy S Esterwood, PA-C   60 mg at 05/08/14 0620  . ondansetron (ZOFRAN) tablet 4 mg  4 mg Oral Q6H PRN Marinda Elk, MD       Or  . ondansetron [facility]) injection 4 mg  4 mg Intravenous  Q6H PRN Marinda Elk, MD   4 mg at 05/03/14 0442  . oxyCODONE-acetaminophen (PERCOCET/ROXICET) 5-325 MG per tablet 2 tablet  2 tablet Oral Q4H PRN Eddie North, MD   2 tablet at 05/08/14 0718  . protein supplement (UNJURY CHOCOLATE CLASSIC) powder 2 oz  2 oz Oral TID Normand Sloop, RD   2 oz at 05/08/14 1000  . tuberculin injection 5 Units  5 Units Intradermal Once Amy S Esterwood, PA-C   5 Units at 05/06/14 1130  . venlafaxine XR (EFFEXOR-XR) 24 hr capsule 75 mg  75 mg Oral Q breakfast Nehemiah Settle, MD   75 mg at 05/08/14 0730    Musculoskeletal: Strength & Muscle Tone: within normal limits Gait & Station: normal Patient leans: N/A  Psychiatric Specialty Exam: Physical Exam as per history and physical   ROS depression anxious and stressed out   Blood pressure 160/94, pulse 89, temperature 97.4 F (36.3 C), temperature source Oral, resp. rate 18, SpO2 100 %.There is no weight on file to calculate BMI.  General Appearance: Casual  Eye Contact::  Good  Speech:  Clear and Coherent  Volume:  Normal  Mood:  Anxious and Depressed  Affect:   Appropriate and Congruent  Thought Process:  Coherent and Goal Directed  Orientation:  Full (Time, Place, and Person)  Thought Content:  WDL  Suicidal Thoughts:  No  Homicidal Thoughts:  No  Memory:  Immediate;   Good Recent;   Good  Judgement:  Intact  Insight:  Good  Psychomotor Activity:  Restlessness  Concentration:  Good  Recall:  Good  Fund of Knowledge:Good  Language: Good  Akathisia:  NA  Handed:  Right  AIMS (if indicated):     Assets:  Communication Skills Desire for Improvement Housing Intimacy Leisure Time Resilience Social Support Talents/Skills Transportation  ADL's:  Intact  Cognition: WNL  Sleep:      Medical Decision Making: Review of Psycho-Social Stressors (1), Review or order clinical lab tests (1), Established Problem, Worsening (2), New Problem, with no additional work-up planned (3), Review or order medicine tests (1), Review of Medication Regimen & Side Effects (2) and Review of New Medication or Change in Dosage (2)  Treatment Plan Summary: Daily contact with patient to assess and evaluate symptoms and progress in treatment and Medication management  Plan:  Continue Effexor XL 75 mg daily for depression  Continue hydroxyzine 50 mg Qhs and repeat x 1 PRN for insomnia Discontinue Remeron 7.5 mg at bedtime due to increased appetite but not able to eat due to stomach pain  Patient does not meet criteria for psychiatric inpatient admission. Supportive therapy provided about ongoing stressors.  Appreciate psychiatric consultation and will sign off at this time Please contact [phone] or [phone] if needs further assistance  Disposition: Patient will be referred to the outpatient psychiatric services when medically stable.   Davisha Linthicum,JANARDHAHA R. 05/08/2014 11:13 AM

## 2014-05-08 NOTE — Progress Notes (Signed)
CSW met with Pt along with psych MD. Pt reports a decrease in anxiety and a more general feeling of being "at ease." Pt and psych MD discussed changes to Pt's medication that the MD made the previous day. Pt reports that he is agreeable to discharge today or tomorrow. He described being able to tolerate food better today.   Psych MD discussed with the Pt that he was not considering any further medication changes.   Peri Maris, [place] 05/08/2014 11:57 AM

## 2014-05-08 NOTE — Progress Notes (Signed)
Pt. Tolerated po dinner well last night, no nausea or vomiting noted. C/O pain rating a 6-8 to abdomen and was medicated q4h prn as ordered. Hydroxyzine given last night for C/O of insomnia, pt slept for about an hour but then set his cell phone to wake him up q4h to ask for pain meds.per pt that is the only way that his pain can be control. Pt currently in no distress will continue to monitor.

## 2014-05-08 NOTE — Progress Notes (Signed)
    Progress Note   Subjective  he actually feels better today. No rectal bleeding, stools forming. His pain is manageable on current regimen (combination of roxicet and IV dilaudid).    Objective   Vital signs in last 24 hours: Temp:  [97.4 F (36.3 C)-98.1 F (36.7 C)] 97.4 F (36.3 C) (03/02 0641) Pulse Rate:  [89-107] 89 (03/02 0641) Resp:  [18-20] 18 (03/02 0641) BP: (150-160)/(86-94) 160/94 mmHg (03/02 0641) SpO2:  [99 %-100 %] 100 % (03/02 0641) Last BM Date: 05/06/14 General:    White male in NAD Abdomen:  Soft, nontender and nondistended. Normal bowel sounds. Extremities:  Without edema. Neurologic:  Alert and oriented,  grossly normal neurologically. Psych:  Cooperative. Normal mood and affect.    Assessment / Plan:     29 year old male with Crohn's ileocolitis. He has improved on IV steroids. We do not plan to start biologics as patient's insurance is about to run out and we don't want to interrupt treatment. Our office has made him an appointment to see an IBD specialist at [facility] on 06/20/14 at 11:15am.  He is weaning off IV narcotics. Patient can be discharge home later today from a GI standpoint. He should go home on Prednisone 20mg  bid and start Azulfidine 1 gram  BiID.    LOS: 9 days   Willette Cluster  05/08/2014, 9:16 AM GI Attending Note  I have personally taken an interval history, reviewed the chart, and examined the patient.  I agree with the extender's note, impression and recommendations.  Barbette Hair. Arlyce Dice, MD, [facility] [phone]

## 2014-05-08 NOTE — Discharge Summary (Signed)
Physician Discharge Summary  Troy Ramirez YKZ:[id number] DOB: 1985/06/16 DOA: 04/29/2014  PCP: Minerva Ends, MD  Admit date: 04/29/2014 Discharge date: 05/08/2014  Time spent: 35 minutes  Recommendations for Outpatient Follow-up:  1. Needs close f/u GI [place] priort o OP follow up at [facility] 2. No taper given for prednisone 20 mg-21 tablets prescribed  3. Started sulfasalazine 1 g twice a day on discharge 4. Started Effexor at this hospital stay 75 mg 5. Started hydroxyzine 50 daily at bedtime X1 and when necessary repeat if unable to sleep 6. I've encouraged patient to follow-up with primary care physician regarding erectile dysfunction  7. Needs CBC and be met in about one week  Discharge Diagnoses:  Principal Problem:   Crohn's ileocolitis Active Problems:   Abdominal pain   Exacerbation of Crohn's disease   Malnutrition of moderate degree   Crohn's disease   Crohn's colitis   Abdominal pain, acute   Hematochezia   Depression   Discharge Condition: Stable  Diet recommendation: Heart healthy low-salt  There were no vitals filed for this visit.  History of present illness:  29 year old male with Crohn's ileocolitis, untreated over last 6 months, recent DC from hospital for Crohn's flare on 04/16/14 and now with worsening abdominal pain, hematochezia. CTscan suggested active disease in colon and thickening of TI. GI managing Crohn's flare.  Hospital Course:   Exacerbations of Crohn's disease Patient started on IV solu-Medrol , kept nothing by mouth and Entocort added. Abdominal pain slowly improving. Still feels gaseous distention periodically. Trying to wean narcotics-on day of discharge was taking mainly Roxicet and IV Dilaudid was discontinued -could not stay on humira or Cimzia due to cost. Gastroenterology elected to start sulfasalazine 1 g twice a day and discharge -Patient has appointment set up with [place] IBD specialist 06/20/14 at 11:15 AM -Gi working to arrange  assistance for humira as outpt. TB testing placed on 2/29. -Tolerated full diet on discharge -We'll need close follow-up as sent home on prednisone 20 mg no taper for 21 days  Chronic abdominal pain: Related to underlying Crohn's. Continue current pain regimen. continue bentyl.  Moderate protein calorie malnutrition: Secondary to underlying Crohn's. Added supplements.  Depression:  regarding multiple issues-losing job, pending loss of insurance ., worries that he will not have access to care for his health issues, sister committed suicide, one brother has CP and is ill. Psychiatry consulted and patient was started patient on Remeron [was discontinued ]  and Effexor75 mg , also started on hydroxyzine at night for sleep 50 mg plus repeat X one if needed . Symptoms much improved.  Anemia:  Iron deficiency. Currently stable  Essential HTN reports being on norvasc previously. BP elevated. Added low dose HCTZ12.5 this admission.  Leukocytosis: Secondary to steroids.   Consultations:  Gastroenterology   Discharge Exam: Filed Vitals:   05/08/14 1429  BP: 143/86  Pulse: 92  Temp: 97.8 F (36.6 C)  Resp: 18    General:   Pleasant oriented no apparent distress EOMI NCAT   Discharge Instructions   Discharge Instructions    Diet - low sodium heart healthy    Complete by:  As directed      Discharge instructions    Complete by:  As directed   I have precridbed all medicaitons as discussed You should taper only after you are seen at GI office off of the Prednisone I have started Azilfudine as well for you     Increase activity slowly  Complete by:  As directed           Current Discharge Medication List    START taking these medications   Details  famotidine (PEPCID) 20 MG tablet Take 1 tablet (20 mg total) by mouth 2 (two) times daily as needed for heartburn.    hydrochlorothiazide (MICROZIDE) 12.5 MG capsule Take 1 capsule (12.5 mg total) by mouth  daily. Qty: 30 capsule, Refills: 0    hydrOXYzine (ATARAX/VISTARIL) 50 MG tablet Take 1 tablet (50 mg total) by mouth at bedtime and may repeat dose one time if needed. Qty: 30 tablet, Refills: 0    oxyCODONE-acetaminophen (PERCOCET/ROXICET) 5-325 MG per tablet Take 2 tablets by mouth every 4 (four) hours as needed for moderate pain. Qty: 15 tablet, Refills: 0    sulfaSALAzine (AZULFIDINE) 500 MG EC tablet Take 2 tablets (1,000 mg total) by mouth 2 (two) times daily. Qty: 60 tablet, Refills: 0    venlafaxine XR (EFFEXOR-XR) 75 MG 24 hr capsule Take 1 capsule (75 mg total) by mouth daily with breakfast. Qty: 30 capsule, Refills: 0      CONTINUE these medications which have CHANGED   Details  LORazepam (ATIVAN) 1 MG tablet Take 1 tablet (1 mg total) by mouth 3 (three) times daily as needed for anxiety. Qty: 12 tablet, Refills: 0    predniSONE (DELTASONE) 20 MG tablet Finish all tablets and speak with GI about taper Qty: 21 tablet, Refills: 0      CONTINUE these medications which have NOT CHANGED   Details  iron polysaccharides (NIFEREX) 150 MG capsule Take 1 capsule daily. Qty: 30 capsule, Refills: 6      STOP taking these medications     acetaminophen (TYLENOL) 500 MG tablet      HYDROmorphone (DILAUDID) 2 MG tablet        Allergies  Allergen Reactions  . Humira [Adalimumab] Anaphylaxis  . Remicade [Infliximab] Anaphylaxis and Swelling   Follow-up Information    Follow up with [facility] On 05/14/2014.   Why:  appointment at 12:00 pm, arrive 15 minutes early and bring hospital discharge paperwork   Contact information:   [address] [place] [id number] [phone]       The results of significant diagnostics from this hospitalization (including imaging, microbiology, ancillary and laboratory) are listed below for reference.    Significant Diagnostic Studies: Ct Abdomen Pelvis W Contrast  04/30/2014    CLINICAL DATA:  Crohn's disease, lower extremity weakness, diffuse abdominal pain and diarrhea bloody diarrhea, Pt. Also complaining of full body tingling  EXAM: CT ABDOMEN AND PELVIS WITH CONTRAST  TECHNIQUE: Multidetector CT imaging of the abdomen and pelvis was performed using the standard protocol following bolus administration of intravenous contrast.  CONTRAST:  130m OMNIPAQUE IOHEXOL 300 MG/ML  SOLN  COMPARISON:  08/28/2013  FINDINGS: Visualized lung bases clear. Unremarkable liver, gallbladder, spleen, adrenal glands, kidneys, pancreas, aorta, portal vein. Stomach, small bowel, and colon are nondilated. Circumferential wall thickening in the terminal ileum. Mild circumferential wall thickening in the transverse colon, moderate circumferential thickening through the length of the descending colon with some surrounding mild inflammatory/edematous changes. No evidence of abscess. No free air. No ascites.  Urinary bladder incompletely distended. No adenopathy localized. Lumbar spine unremarkable. Bilateral sacroiliitis.  IMPRESSION: 1. Moderate descending and mild  transverse colitis. 2. Circumferential wall thickening of the terminal ileum without obstruction. 3. No evidence of abscess.   Electronically Signed   By: DLucrezia Europe  M.D.   On: 04/30/2014 12:48    Microbiology: No results found for this or any previous visit (from the past 240 hour(s)).   Labs: Basic Metabolic Panel:  Recent Labs Lab 05/02/14 0550 05/05/14 0556  NA 139 135  K 3.9 3.4*  CL 103 93*  CO2 29 30  GLUCOSE 113* 95  BUN 13 12  CREATININE 0.71 0.70  CALCIUM 9.1 9.3   Liver Function Tests: No results for input(s): AST, ALT, ALKPHOS, BILITOT, PROT, ALBUMIN in the last 168 hours. No results for input(s): LIPASE, AMYLASE in the last 168 hours. No results for input(s): AMMONIA in the last 168 hours. CBC:  Recent Labs Lab 05/02/14 0550 05/05/14 0556  WBC 12.6* 11.9*  HGB 10.6* 13.1  HCT 34.5* 41.9  MCV 72.8* 72.7*   PLT 532* 661*   Cardiac Enzymes: No results for input(s): CKTOTAL, CKMB, CKMBINDEX, TROPONINI in the last 168 hours. BNP: BNP (last 3 results) No results for input(s): BNP in the last 8760 hours.  ProBNP (last 3 results) No results for input(s): PROBNP in the last 8760 hours.  CBG: No results for input(s): GLUCAP in the last 168 hours.     SignedNita Sells  [facility] 05/08/2014, 3:24 PM

## 2014-05-08 NOTE — Progress Notes (Signed)
Discharge instructions given to pt, verbalized understanding. Left the unit in stable condition. 

## 2014-05-10 ENCOUNTER — Ambulatory Visit (INDEPENDENT_AMBULATORY_CARE_PROVIDER_SITE_OTHER): Payer: 59 | Admitting: Psychiatry

## 2014-05-10 ENCOUNTER — Encounter (HOSPITAL_COMMUNITY): Payer: Self-pay | Admitting: Psychiatry

## 2014-05-10 VITALS — BP 134/90 | HR 105 | Ht 75.0 in | Wt 153.0 lb

## 2014-05-10 DIAGNOSIS — F411 Generalized anxiety disorder: Secondary | ICD-10-CM

## 2014-05-10 DIAGNOSIS — F331 Major depressive disorder, recurrent, moderate: Secondary | ICD-10-CM

## 2014-05-10 MED ORDER — CLONAZEPAM 1 MG PO TABS: 1.0000 mg | ORAL_TABLET | Freq: Two times a day (BID) | ORAL | Status: DC

## 2014-05-10 MED ORDER — VENLAFAXINE HCL ER 75 MG PO CP24: 75.0000 mg | ORAL_CAPSULE | Freq: Two times a day (BID) | ORAL | Status: DC

## 2014-05-10 MED ORDER — HYDROXYZINE HCL 50 MG PO TABS: 50.0000 mg | ORAL_TABLET | Freq: Every evening | ORAL | Status: DC | PRN

## 2014-05-10 NOTE — Progress Notes (Signed)
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 Detected   CBC     Status: Abnormal   Collection Time: 04/30/14  5:45 AM  Result Value Ref Range   WBC 11.3 (H) 4.0 - 10.5 K/uL   RBC 4.73 4.22 - 5.81 MIL/uL   Hemoglobin 10.4 (L) 13.0 - 17.0 g/dL   HCT 34.1 (L) 39.0 - 52.0 %   MCV 72.1 (L) 78.0 - 100.0 fL   MCH 22.0 (L) 26.0 - 34.0 pg   MCHC 30.5 30.0 - 36.0 g/dL   RDW 15.1 11.5 - 15.5 %   Platelets 473 (H) 150 - 400 K/uL  Basic metabolic panel     Status: Abnormal   Collection Time: 04/30/14  5:45 AM  Result Value Ref Range   Sodium 138 135 - 145 mmol/L   Potassium 4.1 3.5 - 5.1 mmol/L   Chloride 105 96 - 112 mmol/L   CO2 22 19 - 32 mmol/L  Glucose, Bld 129 (H) 70 - 99 mg/dL   BUN 9 6 - 23 mg/dL    Creatinine, Ser 0.65 0.50 - 1.35 mg/dL   Calcium 9.2 8.4 - 10.5 mg/dL   GFR calc non Af Amer >90 >90 mL/min   GFR calc Af Amer >90 >90 mL/min    Comment: (NOTE) The eGFR has been calculated using the CKD EPI equation. This calculation has not been validated in all clinical situations. eGFR's persistently <90 mL/min signify possible Chronic Kidney Disease.    Anion gap 11 5 - 15  CBC     Status: Abnormal   Collection Time: 05/02/14  5:50 AM  Result Value Ref Range   WBC 12.6 (H) 4.0 - 10.5 K/uL   RBC 4.74 4.22 - 5.81 MIL/uL   Hemoglobin 10.6 (L) 13.0 - 17.0 g/dL   HCT 34.5 (L) 39.0 - 52.0 %   MCV 72.8 (L) 78.0 - 100.0 fL   MCH 22.4 (L) 26.0 - 34.0 pg   MCHC 30.7 30.0 - 36.0 g/dL   RDW 15.1 11.5 - 15.5 %   Platelets 532 (H) 150 - 400 K/uL  Basic metabolic panel     Status: Abnormal   Collection Time: 05/02/14  5:50 AM  Result Value Ref Range   Sodium 139 135 - 145 mmol/L   Potassium 3.9 3.5 - 5.1 mmol/L   Chloride 103 96 - 112 mmol/L   CO2 29 19 - 32 mmol/L   Glucose, Bld 113 (H) 70 - 99 mg/dL   BUN 13 6 - 23 mg/dL   Creatinine, Ser 0.71 0.50 - 1.35 mg/dL   Calcium 9.1 8.4 - 10.5 mg/dL   GFR calc non Af Amer >90 >90 mL/min   GFR calc Af Amer >90 >90 mL/min    Comment: (NOTE) The eGFR has been calculated using the CKD EPI equation. This calculation has not been validated in all clinical situations. eGFR's persistently <90 mL/min signify possible Chronic Kidney Disease.    Anion gap 7 5 - 15  CBC     Status: Abnormal   Collection Time: 05/05/14  5:56 AM  Result Value Ref Range   WBC 11.9 (H) 4.0 - 10.5 K/uL   RBC 5.76 4.22 - 5.81 MIL/uL   Hemoglobin 13.1 13.0 - 17.0 g/dL   HCT 41.9 39.0 - 52.0 %   MCV 72.7 (L) 78.0 - 100.0 fL   MCH 22.7 (L) 26.0 - 34.0 pg   MCHC 31.3 30.0 - 36.0 g/dL   RDW 15.2 11.5 - 15.5 %   Platelets 661 (H) 150 - 400 K/uL  Basic metabolic panel     Status: Abnormal   Collection Time: 05/05/14  5:56 AM  Result Value Ref Range   Sodium 135 135  - 145 mmol/L   Potassium 3.4 (L) 3.5 - 5.1 mmol/L   Chloride 93 (L) 96 - 112 mmol/L   CO2 30 19 - 32 mmol/L   Glucose, Bld 95 70 - 99 mg/dL   BUN 12 6 - 23 mg/dL   Creatinine, Ser 0.70 0.50 - 1.35 mg/dL   Calcium 9.3 8.4 - 10.5 mg/dL   GFR calc non Af Amer >90 >90 mL/min   GFR calc Af Amer >90 >90 mL/min    Comment: (NOTE) The eGFR has been calculated using the CKD EPI equation. This calculation has not been validated in all clinical situations. eGFR's persistently <90 mL/min signify possible Chronic Kidney Disease.    Anion gap 12 5 - 15  C-reactive  protein     Status: Abnormal   Collection Time: 05/05/14  5:56 AM  Result Value Ref Range   CRP <0.5 (L) <0.60 mg/dL    Comment: Performed at [facility]      Constitutional:  BP 134/90 mmHg  Pulse 105  Ht 6' 3" (1.905 m)  Wt 153 lb (69.4 kg)  BMI 19.12 kg/m2   Mental Status Examination;  Patient is casually dressed and fairly groomed.  He appears thin and lean and he endorse weight loss in past few months.  He appears very anxious and nervous but cooperative.  His speech is clear and coherent.  He described his mood anxious depressed and his affect is constricted.  His thought process logical and goal-directed.  He denies any paranoia, hallucination or any homicidal thoughts.  He endorsed passive and fleeting suicidal thoughts but denies any plan or any intent.  His psychomotor activity is slightly increased.  He appears restless.  His fund of knowledge is good.  There were no delusions present. His attention and concentration is fair.  His cognition is intact.  He is alert and oriented 3.  There were no EPS, tremors or shakes.  His insight judgment and impulse control is okay.  New problem, with additional work up planned, Review of Psycho-Social Stressors (1), Review or order clinical lab tests (1), Decision to obtain old records (1), Review and summation of old records (2), Established Problem, Worsening (2), New  Problem, with no additional work-up planned (3), Review of Medication Regimen & Side Effects (2) and Review of New Medication or Change in Dosage (2)  Assessment: Axis I:  Depression due to general medical condition, rule out major depressive disorder recurrent moderate, generalized anxiety disorder  Axis II:  Deferred  Axis III:  Past Medical History  Diagnosis Date  . Crohn disease   . Anemia     required transfusion of bood in 2008 or 2009  . 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   . Internal hemorrhoids      Plan:  I review his symptoms, history, current medication and blood work.  He is taking Effexor 75 mg daily, Vistaril 50 mg at bedtime and Ativan 1 mg 3 times a day but patient is still has symptoms of anxiety, nervousness and depression.  I recommended to increase Effexor 75 mg twice a day and discontinue Ativan.  In the past he has used Ativan up to 4-5 milligram a day, we will switch to Klonopin 1 mg twice a day to help his anxiety.  I had a long discussion about benzodiazepine dependence, tolerance, withdrawal symptoms.  For now he will continue Vistaril as needed until Effexor dose may help his insomnia.  Also encouraged to see a therapist for counseling.  Patient is very concerned about his insurance which may ended in few days since patient is unemployed.  He is trying to get help from his mother and also applying for financial help.  Discussed medication side effects and benefits.  Recommended to call us back if he has any question, concern if he feel worsening of the symptom.  Follow-up in 3 weeks. Time spent 55 minutes.  More than 50% of the time spent in psychoeducation, counseling and coordination of care.  Discuss safety plan that anytime having active suicidal thoughts or homicidal thoughts then patient need to call 911 or go to the local emergency room.    Kym Scannell T., MD  05/10/2014

## 2014-05-14 ENCOUNTER — Inpatient Hospital Stay: Payer: Self-pay | Admitting: Family Medicine

## 2014-05-14 IMAGING — CT CT ABD-PELV W/ CM
1 of 2 series · 15 of 32 positions shown, 19 images · IV contrast (omnipaque)
Comparison: Report of prior CT scan dated 10/07/2011 and
radiographs dated 08/29/2012

CLINICAL DATA: Nausea and vomiting.  Abdominal pain.  Crohn's
disease.

CT ABDOMEN AND PELVIS WITH CONTRAST
TECHNIQUE: Multidetector CT imaging of the abdomen and pelvis was
performed following the standard protocol during bolus
administration of intravenous contrast.
Contrast: 50mL OMNIPAQUE IOHEXOL 300 MG/ML  SOLN, 100mL OMNIPAQUE
IOHEXOL 300 MG/ML  SOLN

[Series 2: abd/pel with · axial · 0.78mm/px · z∈[+1258,+1708]mm · 15 of 98 slices shown, 19 images]
[im 4/98  soft-tissue]
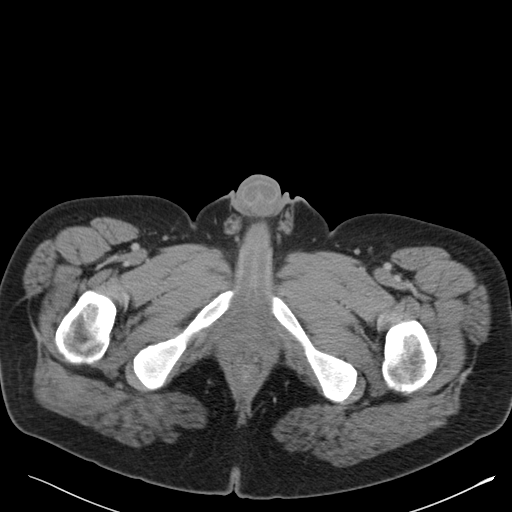
[im 4/98  bone]
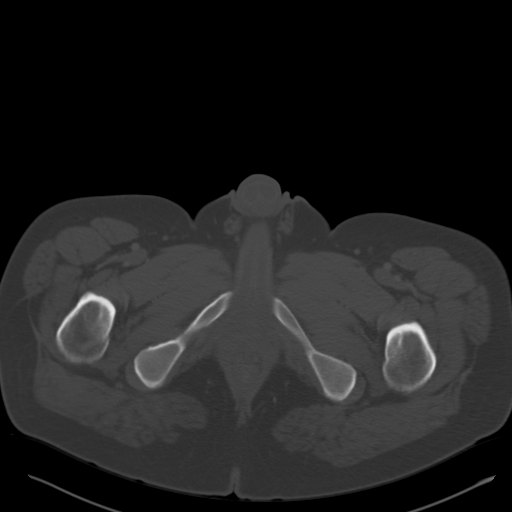
[im 12/98  soft-tissue]
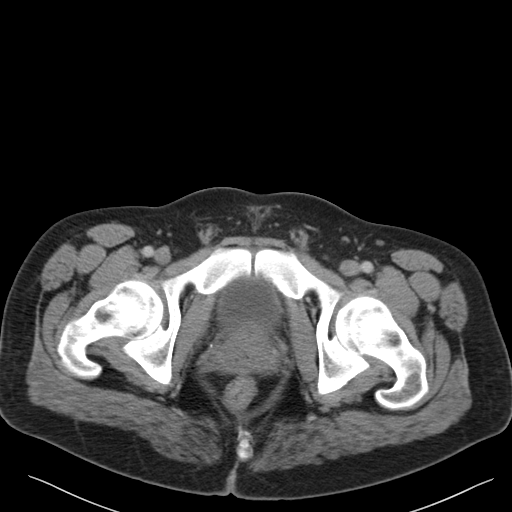
[im 20/98  soft-tissue]
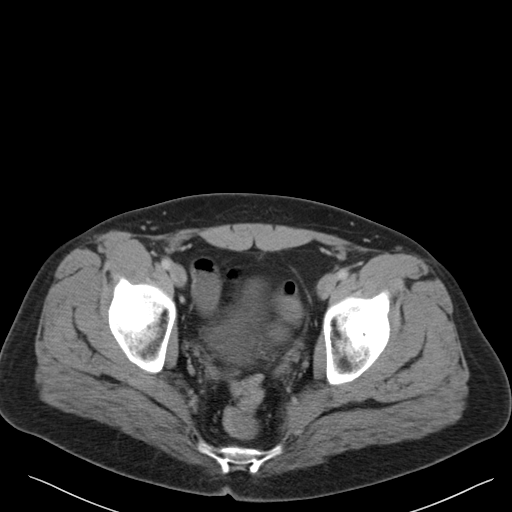
[im 28/98  soft-tissue]
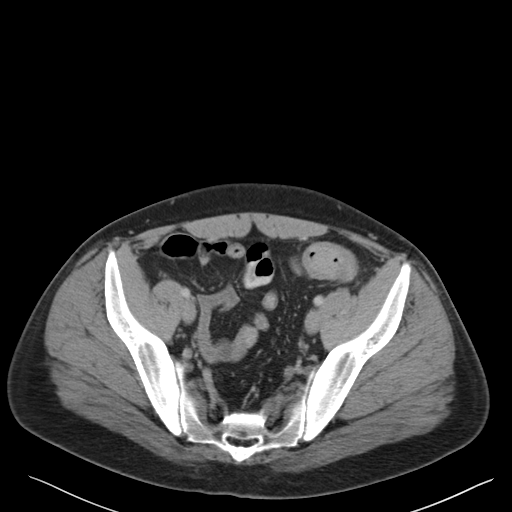
[im 35/98  soft-tissue]
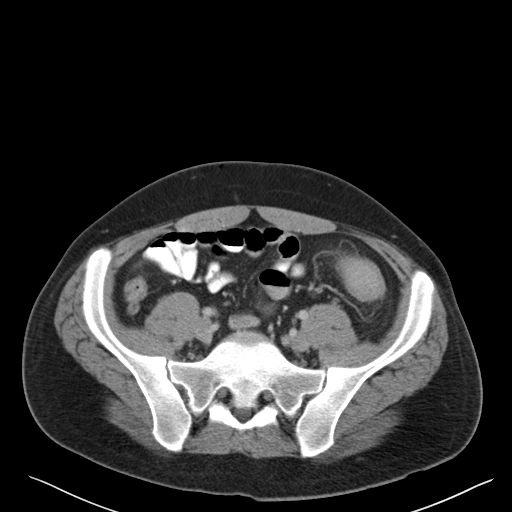
[im 43/98  soft-tissue]
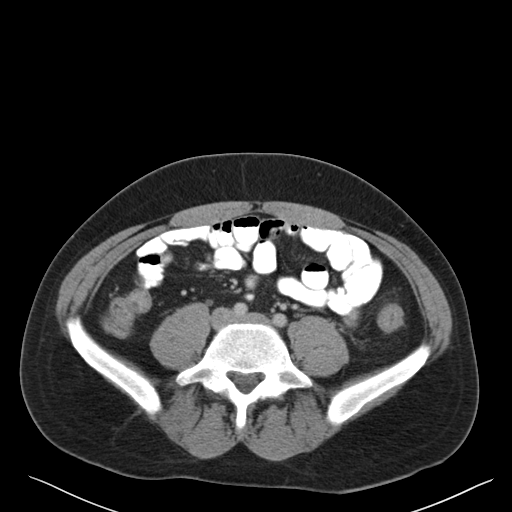
[im 51/98  soft-tissue]
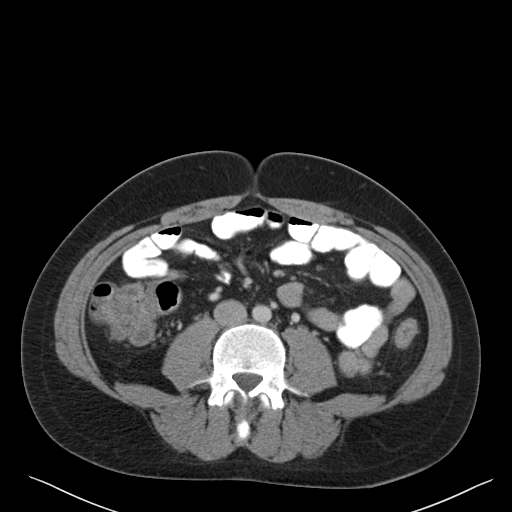
[im 55/98  soft-tissue]
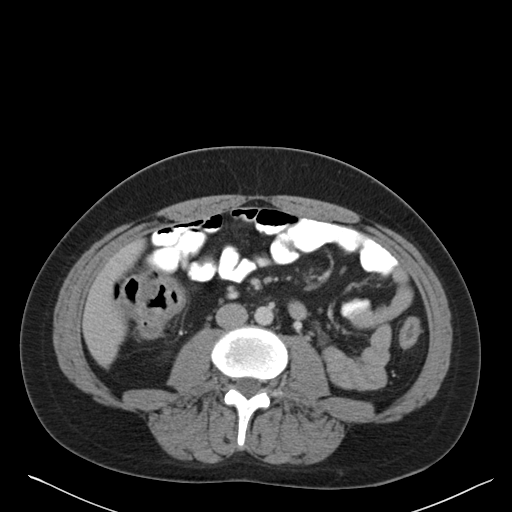
[im 63/98  soft-tissue]
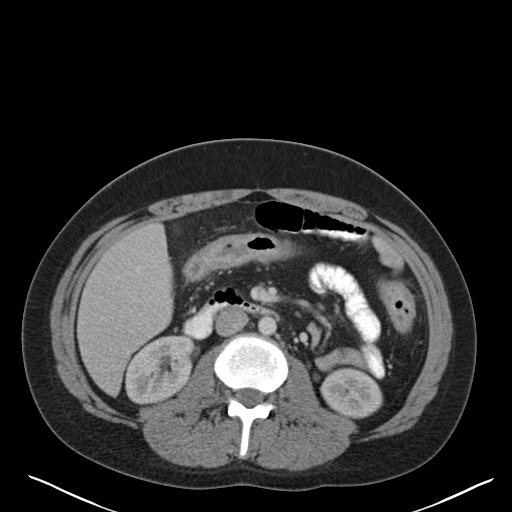
[im 63/98  bone]
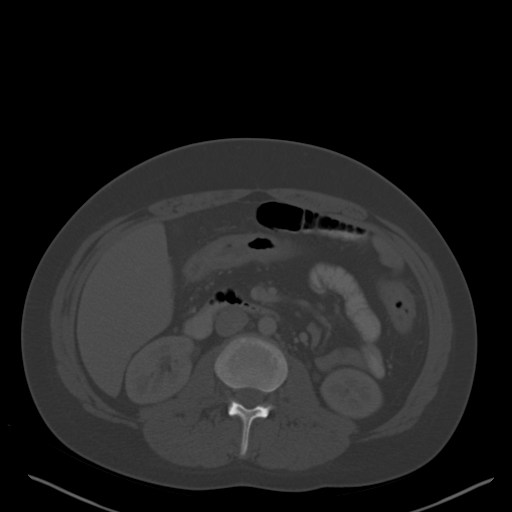
[im 70/98  soft-tissue]
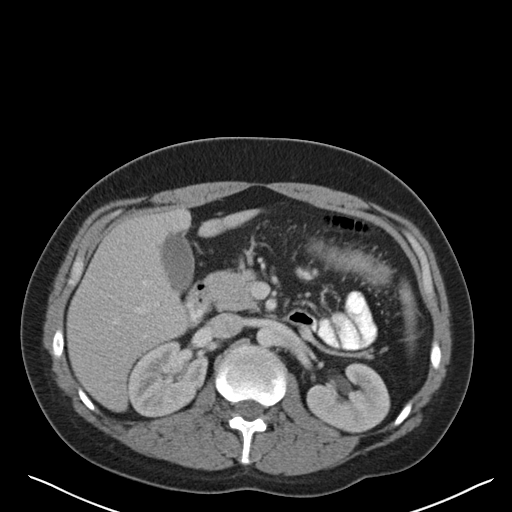
[im 78/98  soft-tissue]
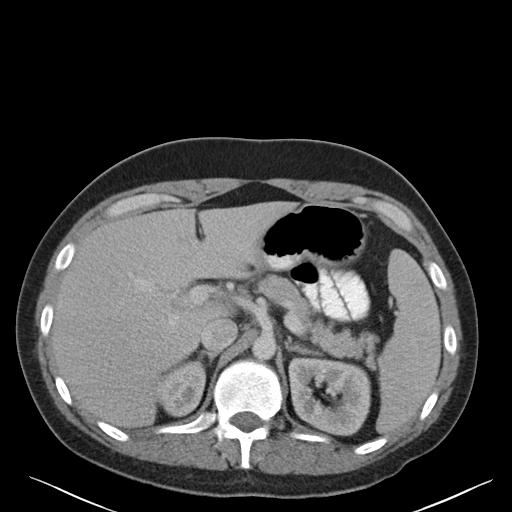
[im 82/98  lung]
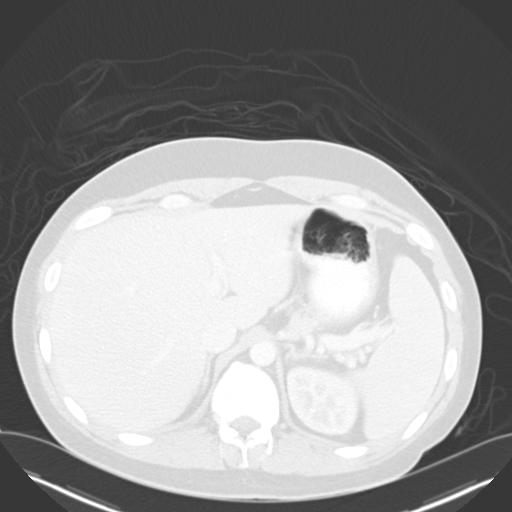
[im 86/98  soft-tissue]
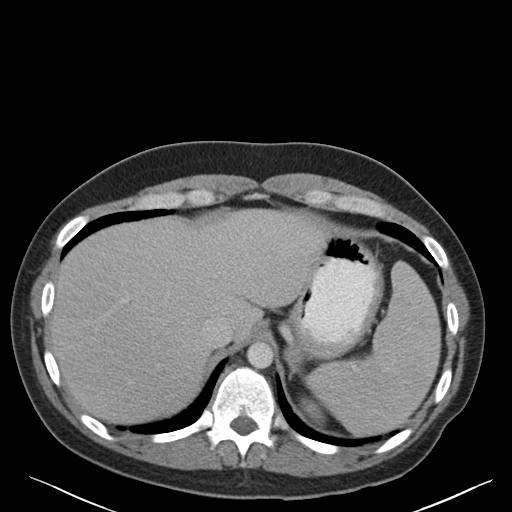
[im 86/98  lung]
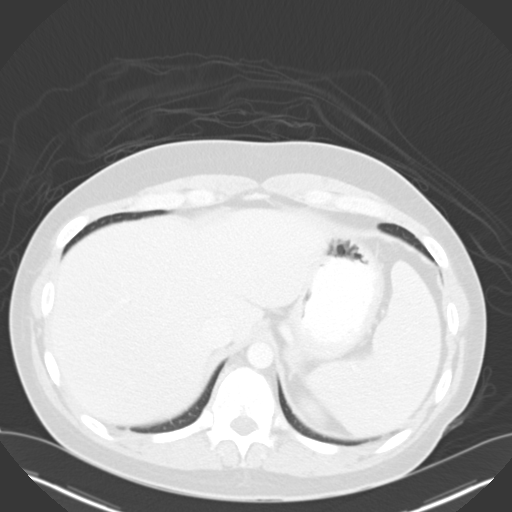
[im 90/98  lung]
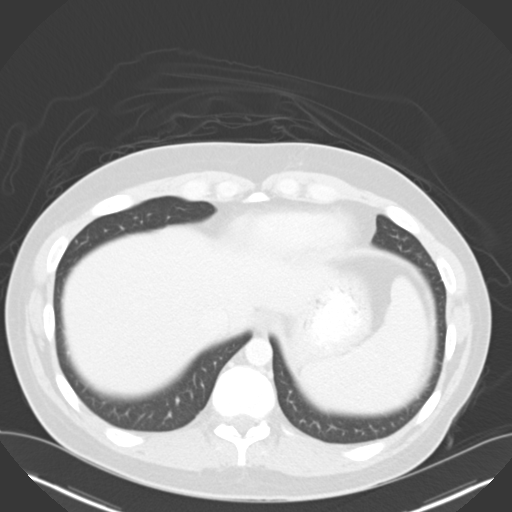
[im 94/98  soft-tissue]
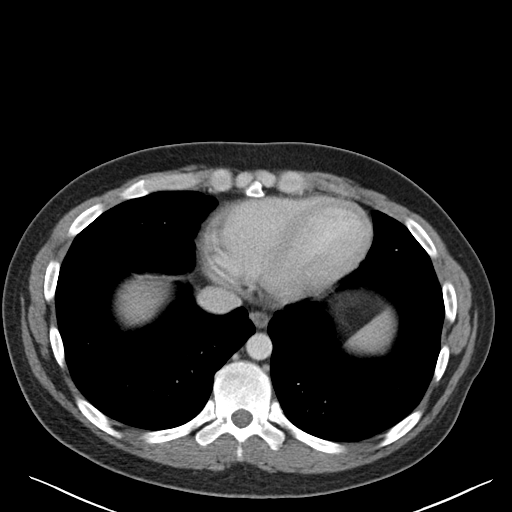
[im 94/98  lung]
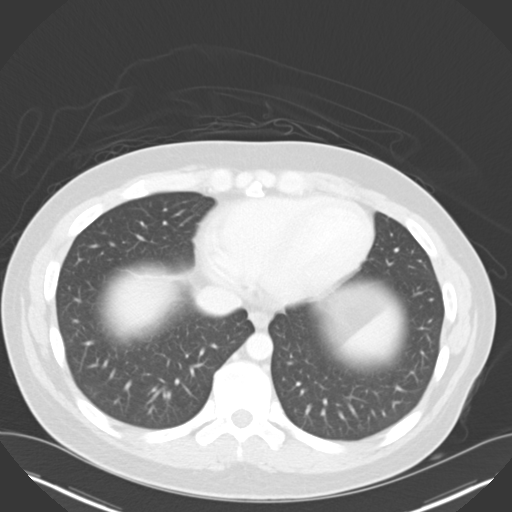

[15 of 32 positions shown; findings below may reference images not displayed]

FINDINGS: There is diffuse thickening of the mucosa of the
transverse and descending portions of the colon.  The mucosa of the
terminal ileum demonstrates slight fatty infiltration.  Appendix is
normal.  Stomach and small bowel are normal.

There is no free air or free fluid in the abdomen.

Liver, spleen, pancreas, adrenal glands, kidneys, and biliary tree
are normal except for slight focal fatty infiltration adjacent to
the falciform ligament in the liver.

No osseous abnormality.
IMPRESSION: Findings consistent with Crohn's colitis.  These findings were
reported on the prior exam.  I suspect the findings are chronic.

## 2014-05-14 IMAGING — CR DG ABDOMEN ACUTE W/ 1V CHEST
4 series · 4 of 4 positions shown · non-contrast
Comparison: None.

CLINICAL DATA: Abdominal pain.  Nausea and vomiting.  Current
history of Crohn's disease.

ACUTE ABDOMEN SERIES (ABDOMEN 2 VIEW & CHEST 1 VIEW)

[w chest pa]
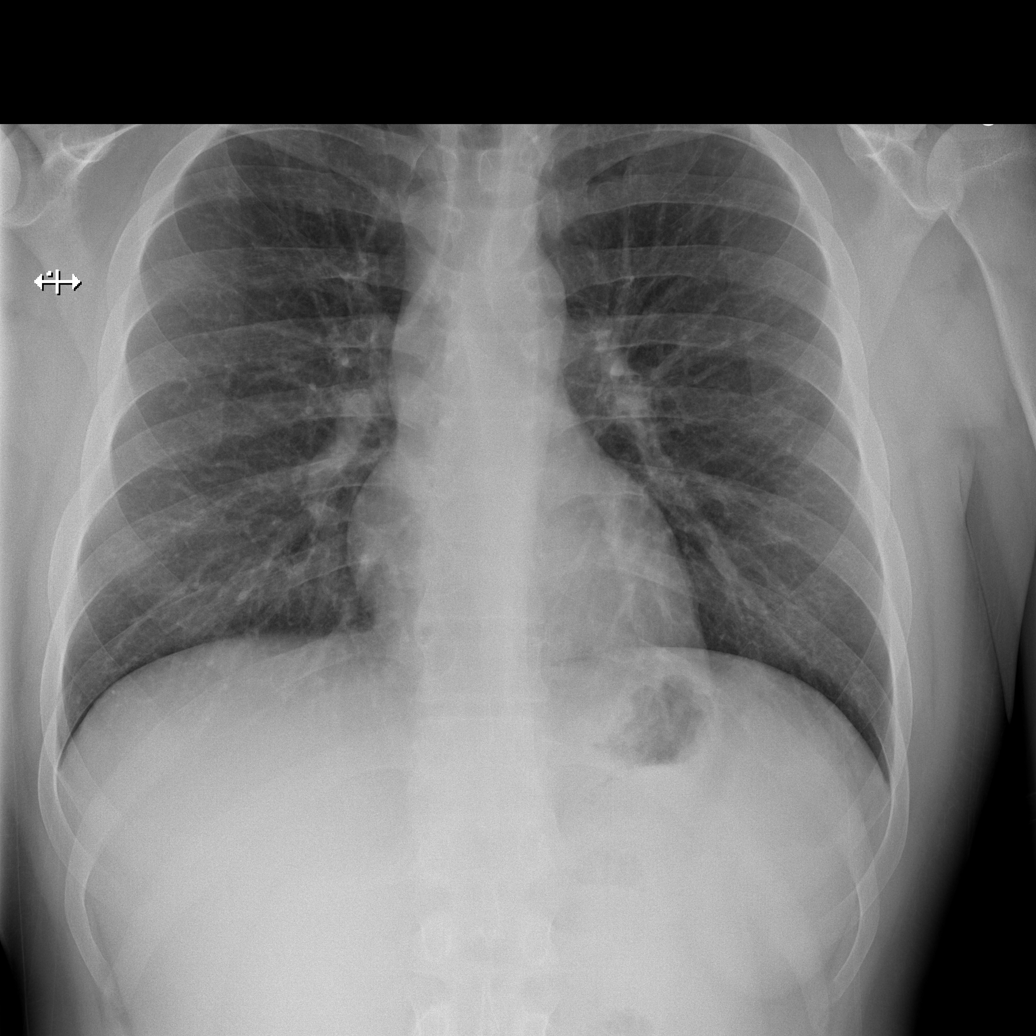

[w abdomen upright]
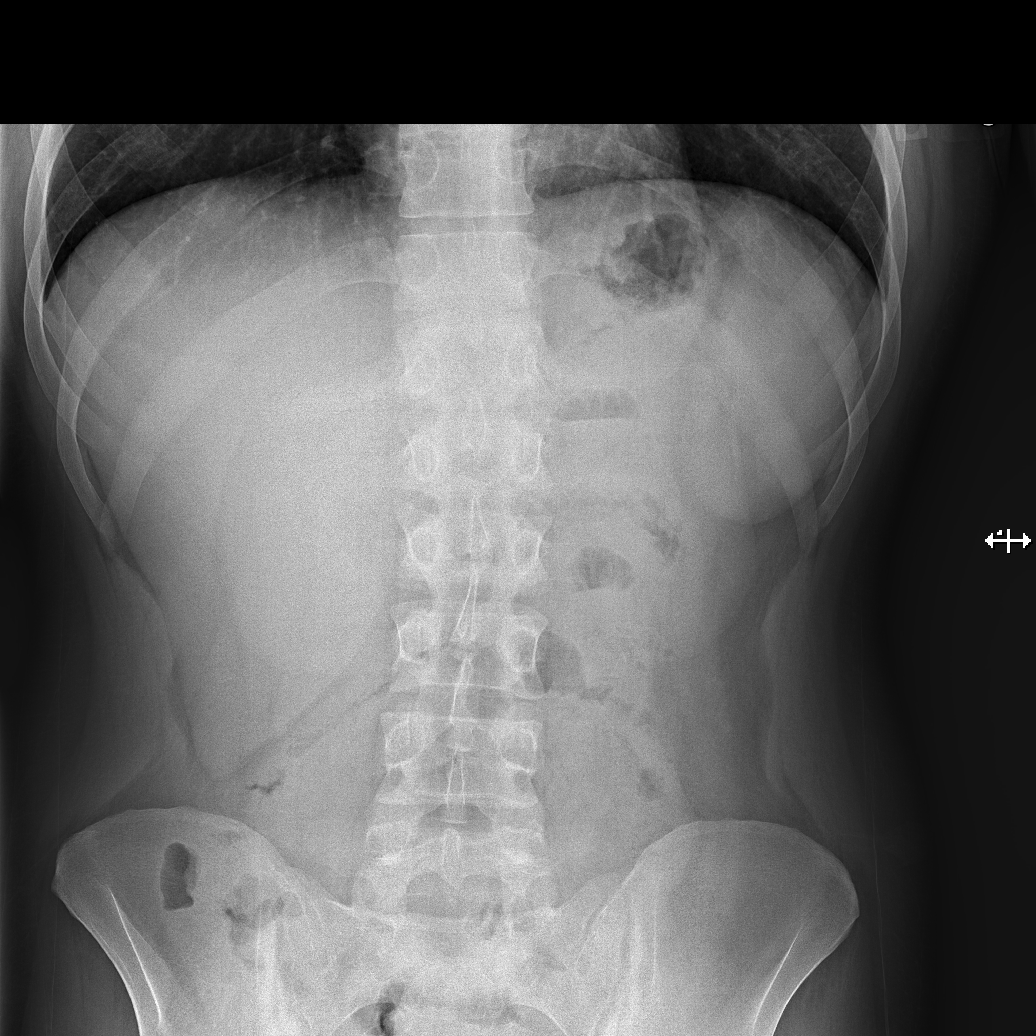

[t abdomen supine (1 of 2)]
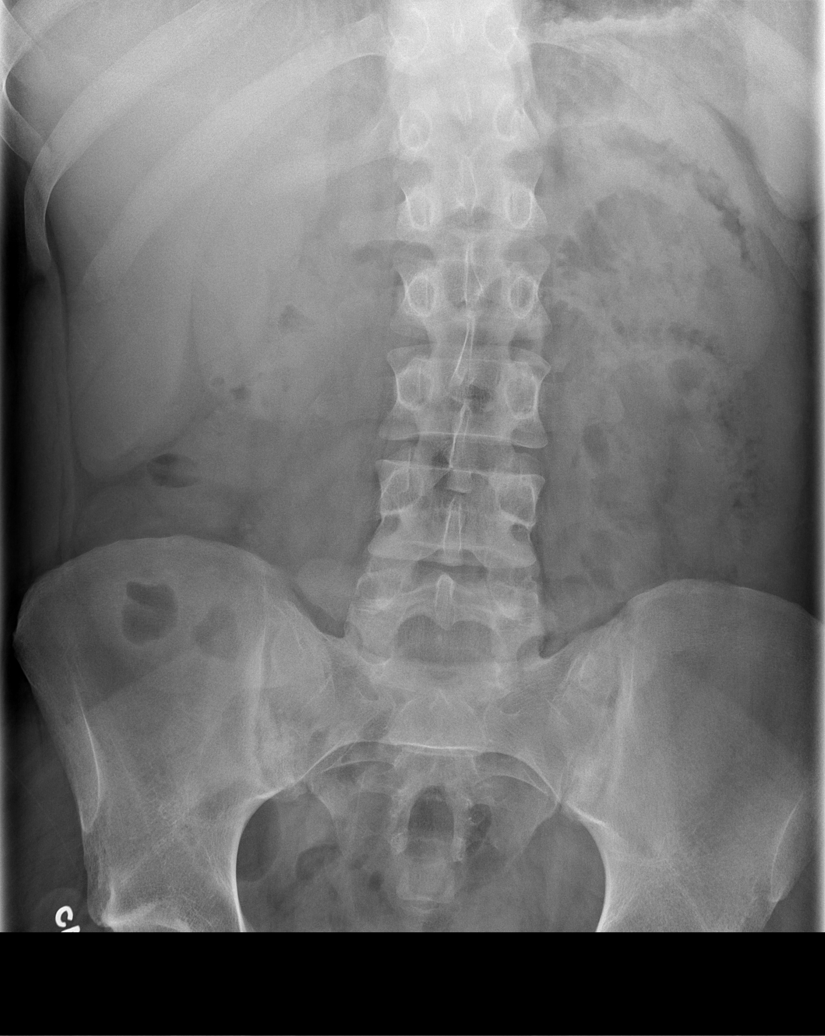

[t abdomen supine (2 of 2)]
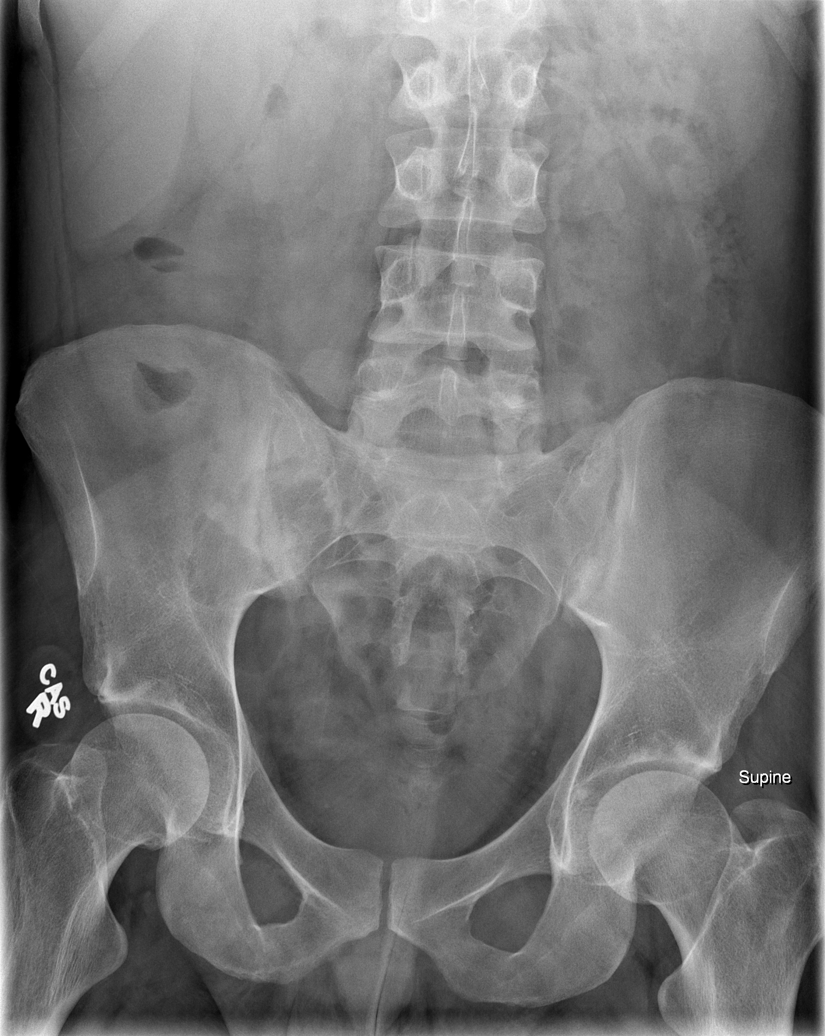

[4 of 4 positions shown; findings below may reference images not displayed]

FINDINGS: No evidence of bowel obstruction.  Abnormal loop of
jejunum in the left upper quadrant demonstrating marked wall
thickening.  Scattered air-fluid levels in the left upper quadrant.
No free intraperitoneal air on the erect image.  Phleboliths in
both sides of the pelvis.  No visible opaque urinary tract calculi.
Degenerative changes involving the sacroiliac joints.  Regional
skeleton otherwise unremarkable.

Cardiomediastinal silhouette unremarkable.   Lungs clear.
Bronchovascular markings normal.  Pulmonary vascularity normal.  No
pneumothorax.  No pleural effusions.
IMPRESSION: 1.  Abnormal loop of jejunum in the left upper quadrant
demonstrating marked wall thickening.  This is consistent with the
given history of Crohn's disease.
2.  No evidence of bowel obstruction.
3.  Scattered air-fluid levels in the left upper quadrant likely
due to localized reflex ileus.
4.  No acute cardiopulmonary disease.

## 2014-05-16 ENCOUNTER — Telehealth: Payer: Self-pay | Admitting: Family Medicine

## 2014-05-16 NOTE — Telephone Encounter (Signed)
Pt states that during 04/19/14 visit he was supposed to receive refill on several medications but because he ended up going to ED he forgot to follow up on refills during visit.  Please f/u with pt with more information.

## 2014-05-17 NOTE — Telephone Encounter (Signed)
Pt requesting Refill Tylenol #3 Has appointment on Wednesday

## 2014-05-22 ENCOUNTER — Ambulatory Visit: Payer: 59 | Attending: Family Medicine | Admitting: Family Medicine

## 2014-05-22 ENCOUNTER — Encounter: Payer: Self-pay | Admitting: Family Medicine

## 2014-05-22 VITALS — BP 138/74 | HR 118 | Temp 98.5°F | Resp 16 | Ht 75.0 in | Wt 177.0 lb

## 2014-05-22 DIAGNOSIS — Z114 Encounter for screening for human immunodeficiency virus [HIV]: Secondary | ICD-10-CM | POA: Insufficient documentation

## 2014-05-22 DIAGNOSIS — F329 Major depressive disorder, single episode, unspecified: Secondary | ICD-10-CM | POA: Diagnosis not present

## 2014-05-22 DIAGNOSIS — K5090 Crohn's disease, unspecified, without complications: Secondary | ICD-10-CM | POA: Diagnosis not present

## 2014-05-22 DIAGNOSIS — F32A Depression, unspecified: Secondary | ICD-10-CM

## 2014-05-22 DIAGNOSIS — M1990 Unspecified osteoarthritis, unspecified site: Secondary | ICD-10-CM | POA: Insufficient documentation

## 2014-05-22 DIAGNOSIS — E876 Hypokalemia: Secondary | ICD-10-CM

## 2014-05-22 LAB — CBC
HEMATOCRIT: 36.6 % — AB (ref 39.0–52.0)
HEMOGLOBIN: 11.6 g/dL — AB (ref 13.0–17.0)
MCH: 22.7 pg — ABNORMAL LOW (ref 26.0–34.0)
MCHC: 31.7 g/dL (ref 30.0–36.0)
MCV: 71.5 fL — ABNORMAL LOW (ref 78.0–100.0)
MPV: 11.4 fL (ref 8.6–12.4)
PLATELETS: 425 10*3/uL — AB (ref 150–400)
RBC: 5.12 MIL/uL (ref 4.22–5.81)
RDW: 18.3 % — ABNORMAL HIGH (ref 11.5–15.5)
WBC: 13 10*3/uL — ABNORMAL HIGH (ref 4.0–10.5)

## 2014-05-22 LAB — BASIC METABOLIC PANEL
BUN: 14 mg/dL (ref 6–23)
CO2: 31 mEq/L (ref 19–32)
Calcium: 9.4 mg/dL (ref 8.4–10.5)
Chloride: 97 mEq/L (ref 96–112)
Creat: 0.67 mg/dL (ref 0.50–1.35)
Glucose, Bld: 91 mg/dL (ref 70–99)
Potassium: 5 mEq/L (ref 3.5–5.3)
SODIUM: 137 meq/L (ref 135–145)

## 2014-05-22 MED ORDER — SULFASALAZINE 500 MG PO TBEC: 1000.0000 mg | DELAYED_RELEASE_TABLET | Freq: Two times a day (BID) | ORAL | Status: DC

## 2014-05-22 MED ORDER — HYDROXYZINE HCL 50 MG PO TABS: 25.0000 mg | ORAL_TABLET | Freq: Every evening | ORAL | Status: DC | PRN

## 2014-05-22 MED ORDER — ACETAMINOPHEN-CODEINE #3 300-30 MG PO TABS: 1.0000 | ORAL_TABLET | Freq: Three times a day (TID) | ORAL | Status: DC | PRN

## 2014-05-22 MED ORDER — VENLAFAXINE HCL ER 75 MG PO CP24: 75.0000 mg | ORAL_CAPSULE | Freq: Two times a day (BID) | ORAL | Status: DC

## 2014-05-22 MED ORDER — PREDNISONE 20 MG PO TABS: 20.0000 mg | ORAL_TABLET | Freq: Every day | ORAL | Status: DC

## 2014-05-22 MED ORDER — HYDROXYZINE HCL 50 MG PO TABS
25.0000 mg | ORAL_TABLET | Freq: Every evening | ORAL | Status: DC | PRN
Start: 2014-05-22 — End: 2014-05-22

## 2014-05-22 MED ORDER — PREDNISONE 20 MG PO TABS: ORAL_TABLET | ORAL | Status: DC

## 2014-05-22 NOTE — Assessment & Plan Note (Signed)
A: persistent P: tylenol #3 for pain control

## 2014-05-22 NOTE — Patient Instructions (Addendum)
Troy Ramirez,  Checking CBC and potassium today.  Refilled effexor, atarax, prednisone, sulfasalazine   New- tylenol #3  Stop at pharmacy for meds and PASS application (medication assistance).   Please apply for [place] discount and orange card, you can also inquire if any of your medications are on the PASS (medications assistance) list.   F/u in 2 months for Crohn's   Dr. Armen Pickup

## 2014-05-22 NOTE — Progress Notes (Signed)
F/U [facility]  Complaining pain Sx coming back

## 2014-05-22 NOTE — Assessment & Plan Note (Signed)
A: stable on current regimen P: Refilled prednisone and sulfasalazine Patient to f.u with [facility] locally while awaiting GI at [facility]

## 2014-05-22 NOTE — Assessment & Plan Note (Signed)
A: improved P: refilled effexor

## 2014-05-22 NOTE — Assessment & Plan Note (Signed)
Screening HIV  

## 2014-05-22 NOTE — Progress Notes (Signed)
   Subjective:    Patient ID: Troy Ramirez, male    DOB: November 12, 1985, 29 y.o.   MRN: [id number] CC: HFU for Crohn's flare, med refills  HPI 29 yo M  1. Crohn's: patient currently stable on prednisone 20 mg daily and sulfasalazine 500 mg BID. Planning to go to [facility] to see an IBD specialist as he does not want to return to [facility]. No fever , emesis or chills.   2. Arthritis: daily pain in knees. Also some in low back. Prednisone seems to make pain worse. Finished percocet from hospital.   3. Depression: some improvement with effexor. He is afraid of running out.   Soc hx: non smoker  Review of Systems Neg for fever Positive for gas, pain in knees, restless legs     Objective:   Physical Exam BP 138/74 mmHg  Pulse 118  Temp(Src) 98.5 F (36.9 C) (Oral)  Resp 16  Ht  (1.905 m)  Wt 177 lb (80.287 kg)  BMI 22.12 kg/m2  SpO2 98% General appearance: alert, cooperative and no distress Lungs: normal WOB  Psych: calm, gets a bit agitated when talking about his medical problems, thought content normal.      Assessment & Plan:

## 2014-05-22 NOTE — Assessment & Plan Note (Signed)
>>  ASSESSMENT AND PLAN FOR CROHN'S DISEASE WRITTEN ON 05/22/2014  4:20 PM BY Dessa Phi, MD  A: stable on current regimen P: Refilled prednisone and sulfasalazine Patient to f.u with [facility] gi locally while awaiting GI at [facility]

## 2014-05-23 ENCOUNTER — Telehealth: Payer: Self-pay | Admitting: *Deleted

## 2014-05-23 LAB — HIV ANTIBODY (ROUTINE TESTING W REFLEX): HIV: NONREACTIVE

## 2014-05-23 MED ORDER — SULFASALAZINE 500 MG PO TBEC: 1000.0000 mg | DELAYED_RELEASE_TABLET | Freq: Two times a day (BID) | ORAL | Status: DC

## 2014-05-23 NOTE — Telephone Encounter (Signed)
-----   Message from Dessa Phi, MD sent at 05/23/2014  9:20 AM EDT ----- Elevated WBC on steroids, not too high.  Hgb 11.6.  Normal BMP- low potassium has resolved.  Screening HIV neg

## 2014-05-23 NOTE — Telephone Encounter (Signed)
Left voice message with lab results If any question return call

## 2014-05-23 NOTE — Addendum Note (Signed)
Addended by: Dessa Phi on: 05/23/2014 02:34 PM   Modules accepted: Orders

## 2014-06-05 ENCOUNTER — Ambulatory Visit (HOSPITAL_COMMUNITY): Payer: Self-pay | Admitting: Psychiatry

## 2014-06-17 ENCOUNTER — Telehealth: Payer: Self-pay | Admitting: Family Medicine

## 2014-06-17 ENCOUNTER — Ambulatory Visit (HOSPITAL_COMMUNITY): Payer: Self-pay | Admitting: Psychiatry

## 2014-06-17 NOTE — Telephone Encounter (Signed)
Patient has called in today to see if he can receive refills on these medications; please f/u with patient about this request so patient knows if he needs to make an appointment or not

## 2014-06-19 ENCOUNTER — Ambulatory Visit (HOSPITAL_COMMUNITY): Payer: Self-pay | Admitting: Clinical

## 2014-06-19 ENCOUNTER — Telehealth: Payer: Self-pay | Admitting: *Deleted

## 2014-06-19 MED ORDER — POLYSACCHARIDE IRON COMPLEX 150 MG PO CAPS: 150.0000 mg | ORAL_CAPSULE | Freq: Every day | ORAL | Status: DC

## 2014-06-19 MED ORDER — HYDROCHLOROTHIAZIDE 12.5 MG PO CAPS: 12.5000 mg | ORAL_CAPSULE | Freq: Every day | ORAL | Status: DC

## 2014-06-19 NOTE — Telephone Encounter (Signed)
Mental health needed for klonopin.  I have refilled microzide and niferex.

## 2014-06-21 ENCOUNTER — Ambulatory Visit (HOSPITAL_COMMUNITY): Payer: Self-pay | Admitting: Psychiatry

## 2014-06-24 ENCOUNTER — Other Ambulatory Visit: Payer: Self-pay | Admitting: *Deleted

## 2014-06-24 MED ORDER — HYDROCHLOROTHIAZIDE 12.5 MG PO CAPS: 12.5000 mg | ORAL_CAPSULE | Freq: Every day | ORAL | Status: DC

## 2014-06-26 ENCOUNTER — Telehealth: Payer: Self-pay | Admitting: *Deleted

## 2014-06-26 NOTE — Telephone Encounter (Signed)
Left voice message to return call    Expand All Collapse All    Mental health needed for klonopin.  I have refilled microzide and niferex.

## 2015-04-02 MED FILL — sulfaSALAzine 500 MG TABS: 500 | 30 days supply | Qty: 240 | Fill #3

## 2015-04-02 MED FILL — HYDROCHLOROTHIAZIDE 12.5 MG: 12.5 | 30 days supply | Qty: 30 | Fill #2

## 2015-05-07 ENCOUNTER — Other Ambulatory Visit: Payer: Self-pay | Admitting: Family Medicine

## 2015-05-19 ENCOUNTER — Ambulatory Visit: Payer: 59 | Attending: Family Medicine | Admitting: Family Medicine

## 2015-05-19 ENCOUNTER — Encounter: Payer: Self-pay | Admitting: Family Medicine

## 2015-05-19 VITALS — BP 144/72 | HR 89 | Temp 98.2°F | Resp 16 | Ht 74.0 in | Wt 182.0 lb

## 2015-05-19 DIAGNOSIS — Z0000 Encounter for general adult medical examination without abnormal findings: Secondary | ICD-10-CM

## 2015-05-19 DIAGNOSIS — Z760 Encounter for issue of repeat prescription: Secondary | ICD-10-CM | POA: Insufficient documentation

## 2015-05-19 DIAGNOSIS — K5090 Crohn's disease, unspecified, without complications: Secondary | ICD-10-CM | POA: Diagnosis not present

## 2015-05-19 DIAGNOSIS — Z79899 Other long term (current) drug therapy: Secondary | ICD-10-CM | POA: Insufficient documentation

## 2015-05-19 DIAGNOSIS — I10 Essential (primary) hypertension: Secondary | ICD-10-CM | POA: Insufficient documentation

## 2015-05-19 DIAGNOSIS — K921 Melena: Secondary | ICD-10-CM | POA: Insufficient documentation

## 2015-05-19 LAB — COMPLETE METABOLIC PANEL WITH GFR
ALBUMIN: 4.2 g/dL (ref 3.6–5.1)
ALT: 8 U/L — ABNORMAL LOW (ref 9–46)
AST: 14 U/L (ref 10–40)
Alkaline Phosphatase: 36 U/L — ABNORMAL LOW (ref 40–115)
BILIRUBIN TOTAL: 0.3 mg/dL (ref 0.2–1.2)
BUN: 6 mg/dL — ABNORMAL LOW (ref 7–25)
CHLORIDE: 104 mmol/L (ref 98–110)
CO2: 28 mmol/L (ref 20–31)
Calcium: 9.7 mg/dL (ref 8.6–10.3)
Creat: 0.61 mg/dL (ref 0.60–1.35)
GFR, Est Non African American: >89 mL/min (ref >=60)
Glucose, Bld: 65 mg/dL (ref 65–99)
POTASSIUM: 4.4 mmol/L (ref 3.5–5.3)
Sodium: 140 mmol/L (ref 135–146)
TOTAL PROTEIN: 7.3 g/dL (ref 6.1–8.1)

## 2015-05-19 LAB — CBC
HEMATOCRIT: 36.9 % — AB (ref 39.0–52.0)
Hemoglobin: 11.7 g/dL — ABNORMAL LOW (ref 13.0–17.0)
MCH: 24.2 pg — ABNORMAL LOW (ref 26.0–34.0)
MCHC: 31.7 g/dL (ref 30.0–36.0)
MCV: 76.2 fL — ABNORMAL LOW (ref 78.0–100.0)
MPV: 12 fL (ref 8.6–12.4)
PLATELETS: 445 10*3/uL — AB (ref 150–400)
RBC: 4.84 MIL/uL (ref 4.22–5.81)
RDW: 15.9 % — ABNORMAL HIGH (ref 11.5–15.5)
WBC: 9.3 10*3/uL (ref 4.0–10.5)

## 2015-05-19 LAB — POCT GLYCOSYLATED HEMOGLOBIN (HGB A1C): Hemoglobin A1C: 5.1

## 2015-05-19 MED ORDER — HYDROCHLOROTHIAZIDE 12.5 MG PO CAPS: 12.5000 mg | ORAL_CAPSULE | Freq: Every day | ORAL | Status: DC

## 2015-05-19 MED ORDER — SIMETHICONE 125 MG PO CHEW: 125.0000 mg | CHEWABLE_TABLET | Freq: Four times a day (QID) | ORAL | Status: DC | PRN

## 2015-05-19 MED ORDER — PANTOPRAZOLE SODIUM 40 MG PO TBEC: 40.0000 mg | DELAYED_RELEASE_TABLET | Freq: Every day | ORAL | Status: DC

## 2015-05-19 MED ORDER — SULFASALAZINE 500 MG PO TABS: 1000.0000 mg | ORAL_TABLET | Freq: Two times a day (BID) | ORAL | Status: DC

## 2015-05-19 MED FILL — PANTOPRAZOLE SOD DR 40 MG T: 40 | 30 days supply | Qty: 30 | Fill #0

## 2015-05-19 MED FILL — ?HYDROCHLOROTHIAZIDE 12.5MG: 12.5 | 30 days supply | Qty: 30 | Fill #0

## 2015-05-19 MED FILL — sulfaSALAzine 500 MG TABS: 500 | 30 days supply | Qty: 120 | Fill #0

## 2015-05-19 NOTE — Addendum Note (Signed)
Addended by: Dyann Kief on: 05/19/2015 04:27 PM   Modules accepted: Orders

## 2015-05-19 NOTE — Assessment & Plan Note (Signed)
A; HTN, elevated BP  Meds: non compliant P: Refilled HCTZ

## 2015-05-19 NOTE — Patient Instructions (Addendum)
Troy Ramirez was seen today for medication refill.  Diagnoses and all orders for this visit:  Essential hypertension -     hydrochlorothiazide (MICROZIDE) 12.5 MG capsule; Take 1 capsule (12.5 mg total) by mouth daily. Reported on 05/19/2015 -     COMPLETE METABOLIC PANEL WITH GFR  Crohn's disease without complication, unspecified gastrointestinal tract location (HCC) -     pantoprazole (PROTONIX) 40 MG tablet; Take 1 tablet (40 mg total) by mouth daily. Reported on 05/19/2015 -     simethicone (MYLICON) 125 MG chewable tablet; Chew 1 tablet (125 mg total) by mouth every 6 (six) hours as needed for flatulence. Reported on 05/19/2015 -     sulfaSALAzine (AZULFIDINE) 500 MG tablet; Take 2 tablets (1,000 mg total) by mouth 2 (two) times daily. -     CBC    The 1000 mg sulfasalazine is on national back order, unavailable Call to schedule follow up with Dr. Shirlee Limerick your GI doctor.   F/u in 2 months for HTN  Dr. Armen Pickup

## 2015-05-19 NOTE — Progress Notes (Signed)
Subjective:  Patient ID: Troy Ramirez, male    DOB: 1985-08-13  Age: 30 y.o. MRN: [id number]  CC: Medication Refill   HPI Troy Ramirez presents for    1. Chron's disease: taking humira and sulfasalazine. Has established care with GI at [facility] but has not been seen since 08/2014. Admits to blood in stool and arthralgias. He has been tapered off prednisone.   2. HTN: not taking HCTZ. No HA, CP, SOB or leg swelling.  Social History  Substance Use Topics  . Smoking status: Never Smoker   . Smokeless tobacco: Never Used  . Alcohol Use: 0.0 oz/week    0 Standard drinks or equivalent per week     Comment: rarely    Outpatient Prescriptions Prior to Visit  Medication Sig Dispense Refill  . acetaminophen-codeine (TYLENOL #3) 300-30 MG per tablet Take 1 tablet by mouth every 8 (eight) hours as needed for moderate pain (can take two at once, but no more than 3 pills per day). 30 tablet 0  . clonazePAM (KLONOPIN) 1 MG tablet Take 1 tablet (1 mg total) by mouth 2 (two) times daily. 60 tablet 0  . famotidine (PEPCID) 20 MG tablet Take 1 tablet (20 mg total) by mouth 2 (two) times daily as needed for heartburn. (Patient not taking: Reported on 05/22/2014)    . hydrochlorothiazide (MICROZIDE) 12.5 MG capsule TAKE 1 CAPSULE BY MOUTH DAILY. 30 capsule 0  . hydrOXYzine (ATARAX/VISTARIL) 50 MG tablet Take 0.5-1 tablets (25-50 mg total) by mouth at bedtime as needed for anxiety. 30 tablet 2  . iron polysaccharides (NIFEREX) 150 MG capsule Take 1 capsule (150 mg total) by mouth daily. Take 1 capsule daily. 30 capsule 2  . predniSONE (DELTASONE) 20 MG tablet Take 1 tablet (20 mg total) by mouth daily with breakfast. 60 tablet 2  . simethicone (MYLICON) 125 MG chewable tablet Chew 125 mg by mouth every 6 (six) hours as needed for flatulence.    . sulfaSALAzine (AZULFIDINE) 500 MG EC tablet Take 2 tablets (1,000 mg total) by mouth 2 (two) times daily. 120 tablet 2  . venlafaxine XR (EFFEXOR-XR) 75 MG 24  hr capsule Take 1 capsule (75 mg total) by mouth 2 (two) times daily. 60 capsule 3   No facility-administered medications prior to visit.    ROS Review of Systems  Constitutional: Negative for fever, chills, fatigue and unexpected weight change.  Eyes: Negative for visual disturbance.  Respiratory: Negative for cough and shortness of breath.   Cardiovascular: Negative for chest pain, palpitations and leg swelling.  Gastrointestinal: Positive for blood in stool. Negative for nausea, vomiting, abdominal pain, diarrhea and constipation.  Endocrine: Negative for polydipsia, polyphagia and polyuria.  Musculoskeletal: Positive for arthralgias. Negative for myalgias, back pain, gait problem and neck pain.  Skin: Negative for rash.  Allergic/Immunologic: Negative for immunocompromised state.  Hematological: Negative for adenopathy. Does not bruise/bleed easily.  Psychiatric/Behavioral: Negative for suicidal ideas, sleep disturbance and dysphoric mood. The patient is not nervous/anxious.     Objective:  BP 144/72 mmHg  Pulse 89  Temp(Src) 98.2 F (36.8 C) (Oral)  Resp 16  Ht  (1.88 m)  Wt 182 lb (82.555 kg)  BMI 23.36 kg/m2  SpO2 99%  BP/Weight 05/19/2015 05/22/2014 05/08/2014  Systolic BP 144 138 143  Diastolic BP 72 74 86  Wt. (Lbs) 182 177 -  BMI 23.36 22.12 -  Some encounter information is confidential and restricted. Go to Review Flowsheets activity to see all data.  Physical Exam  Constitutional: He appears well-developed and well-nourished. No distress.  HENT:  Head: Normocephalic and atraumatic.  Neck: Normal range of motion. Neck supple.  Cardiovascular: Normal rate, regular rhythm, normal heart sounds and intact distal pulses.   Pulmonary/Chest: Effort normal and breath sounds normal.  Musculoskeletal: He exhibits no edema.  Neurological: He is alert.  Skin: Skin is warm and dry. No rash noted. No erythema.  Psychiatric: He has a normal mood and affect.    Depression screen [facility] 2/9 05/19/2015 05/22/2014 04/19/2014  Decreased Interest 2 1 3   Down, Depressed, Hopeless 2 1 3   PHQ - 2 Score 4 2 6   Altered sleeping 3 1 3   Tired, decreased energy 3 1 3   Change in appetite 3 1 3   Feeling bad or failure about yourself  2 1 3   Trouble concentrating 1 1 3   Moving slowly or fidgety/restless 1 1 3   Suicidal thoughts 0 1 2  PHQ-9 Score 17 9 26     GAD 7 : Generalized Anxiety Score 05/19/2015  Nervous, Anxious, on Edge 2  Control/stop worrying 3  Worry too much - different things 3  Trouble relaxing 3  Restless 2  Easily annoyed or irritable 3  Afraid - awful might happen 1  Total GAD 7 Score 17       Assessment & Plan:   There are no diagnoses linked to this encounter. Hatim was seen today for medication refill.  Diagnoses and all orders for this visit:  Essential hypertension -     hydrochlorothiazide (MICROZIDE) 12.5 MG capsule; Take 1 capsule (12.5 mg total) by mouth daily. Reported on 05/19/2015 -     COMPLETE METABOLIC PANEL WITH GFR  Crohn's disease without complication, unspecified gastrointestinal tract location (HCC) -     pantoprazole (PROTONIX) 40 MG tablet; Take 1 tablet (40 mg total) by mouth daily. Reported on 05/19/2015 -     simethicone (MYLICON) 125 MG chewable tablet; Chew 1 tablet (125 mg total) by mouth every 6 (six) hours as needed for flatulence. Reported on 05/19/2015 -     sulfaSALAzine (AZULFIDINE) 500 MG tablet; Take 2 tablets (1,000 mg total) by mouth 2 (two) times daily. -     CBC   Meds ordered this encounter  Medications  . hydrochlorothiazide (MICROZIDE) 12.5 MG capsule    Sig: Take 1 capsule (12.5 mg total) by mouth daily. Reported on 05/19/2015    Dispense:  30 capsule    Refill:  5  . pantoprazole (PROTONIX) 40 MG tablet    Sig: Take 1 tablet (40 mg total) by mouth daily. Reported on 05/19/2015    Dispense:  30 tablet    Refill:  5  . simethicone (MYLICON) 125 MG chewable tablet    Sig: Chew 1  tablet (125 mg total) by mouth every 6 (six) hours as needed for flatulence. Reported on 05/19/2015    Dispense:  30 tablet    Refill:  5  . sulfaSALAzine (AZULFIDINE) 500 MG tablet    Sig: Take 2 tablets (1,000 mg total) by mouth 2 (two) times daily.    Dispense:  120 tablet    Refill:  5    Follow-up: No Follow-up on file.   Dessa Phi MD

## 2015-05-19 NOTE — Assessment & Plan Note (Signed)
A; chronic without acute flare P: Refilled sulfasalazine Patient to continue humira per GI F/u with GI CBC  Today

## 2015-05-19 NOTE — Progress Notes (Signed)
Medication refills  No medication x 1 year  Muscle and join pain  Blood in the stool  Pain scale # 7 No tobacco user  No suicidal thoughts in the past two weeks

## 2015-05-19 NOTE — Assessment & Plan Note (Signed)
>>  ASSESSMENT AND PLAN FOR CROHN'S DISEASE WRITTEN ON 05/19/2015  3:55 PM BY Dessa PhiFUNCHES, JOSALYN, MD  A; chronic without acute flare P: Refilled sulfasalazine Patient to continue humira per GI F/u with GI CBC  Today

## 2015-05-20 ENCOUNTER — Encounter: Payer: Self-pay | Admitting: Clinical

## 2015-05-20 NOTE — Progress Notes (Signed)
Depression screen [facility] 2/9 05/19/2015 05/22/2014 04/19/2014  Decreased Interest 2 1 3   Down, Depressed, Hopeless 2 1 3   PHQ - 2 Score 4 2 6   Altered sleeping 3 1 3   Tired, decreased energy 3 1 3   Change in appetite 3 1 3   Feeling bad or failure about yourself  2 1 3   Trouble concentrating 1 1 3   Moving slowly or fidgety/restless 1 1 3   Suicidal thoughts 0 1 2  PHQ-9 Score 17 9 26    GAD 7 : Generalized Anxiety Score 05/19/2015  Nervous, Anxious, on Edge 2  Control/stop worrying 3  Worry too much - different things 3  Trouble relaxing 3  Restless 2  Easily annoyed or irritable 3  Afraid - awful might happen 1  Total GAD 7 Score 17

## 2015-05-22 ENCOUNTER — Telehealth: Payer: Self-pay | Admitting: *Deleted

## 2015-05-22 NOTE — Telephone Encounter (Signed)
LVM to return call.

## 2015-05-22 NOTE — Telephone Encounter (Signed)
-----   Message from Dessa Phi, MD sent at 05/20/2015  8:37 AM EDT ----- Normal BMP Slight anemia on CBC Continue current care plan including GI follow up

## 2015-05-22 NOTE — Telephone Encounter (Signed)
Returning call from nurse....please follow up

## 2015-05-26 NOTE — Telephone Encounter (Signed)
Date of Birth verified by pt  Lab results given  Advised to keep GI appointments  Continue taking medication as prescribed  Pt verbalized understanding

## 2015-07-07 MED FILL — sulfaSALAzine 500 MG TABS: 500 | 30 days supply | Qty: 120 | Fill #1

## 2015-07-07 MED FILL — ?HYDROCHLOROTHIAZIDE 12.5MG: 12.5 | 30 days supply | Qty: 30 | Fill #1

## 2015-07-07 MED FILL — ?PANTOPRAZOLE SOD DR 40MG: 40 MG | 30 days supply | Qty: 30 | Fill #1

## 2015-07-09 ENCOUNTER — Encounter (HOSPITAL_COMMUNITY): Payer: Self-pay | Admitting: Emergency Medicine

## 2015-07-09 ENCOUNTER — Emergency Department (HOSPITAL_COMMUNITY): Payer: Self-pay

## 2015-07-09 ENCOUNTER — Inpatient Hospital Stay (HOSPITAL_COMMUNITY)
Admission: EM | Admit: 2015-07-09 | Discharge: 2015-07-12 | DRG: 387 | Disposition: A | Discharge status: Home / Self Care | Payer: Self-pay | Attending: Internal Medicine | Admitting: Internal Medicine

## 2015-07-09 DIAGNOSIS — Z833 Family history of diabetes mellitus: Secondary | ICD-10-CM

## 2015-07-09 DIAGNOSIS — Z79899 Other long term (current) drug therapy: Secondary | ICD-10-CM

## 2015-07-09 DIAGNOSIS — Z8249 Family history of ischemic heart disease and other diseases of the circulatory system: Secondary | ICD-10-CM

## 2015-07-09 DIAGNOSIS — D72829 Elevated white blood cell count, unspecified: Secondary | ICD-10-CM

## 2015-07-09 DIAGNOSIS — Z8042 Family history of malignant neoplasm of prostate: Secondary | ICD-10-CM

## 2015-07-09 DIAGNOSIS — K219 Gastro-esophageal reflux disease without esophagitis: Secondary | ICD-10-CM | POA: Diagnosis present

## 2015-07-09 DIAGNOSIS — D638 Anemia in other chronic diseases classified elsewhere: Secondary | ICD-10-CM | POA: Diagnosis present

## 2015-07-09 DIAGNOSIS — K50819 Crohn's disease of both small and large intestine with unspecified complications: Secondary | ICD-10-CM | POA: Diagnosis present

## 2015-07-09 DIAGNOSIS — Z9119 Patient's noncompliance with other medical treatment and regimen: Secondary | ICD-10-CM

## 2015-07-09 DIAGNOSIS — R1084 Generalized abdominal pain: Secondary | ICD-10-CM

## 2015-07-09 DIAGNOSIS — I10 Essential (primary) hypertension: Secondary | ICD-10-CM | POA: Diagnosis present

## 2015-07-09 DIAGNOSIS — E860 Dehydration: Secondary | ICD-10-CM | POA: Diagnosis present

## 2015-07-09 DIAGNOSIS — K921 Melena: Secondary | ICD-10-CM | POA: Diagnosis present

## 2015-07-09 DIAGNOSIS — K5090 Crohn's disease, unspecified, without complications: Secondary | ICD-10-CM | POA: Diagnosis present

## 2015-07-09 DIAGNOSIS — D509 Iron deficiency anemia, unspecified: Secondary | ICD-10-CM | POA: Insufficient documentation

## 2015-07-09 DIAGNOSIS — R630 Anorexia: Secondary | ICD-10-CM | POA: Diagnosis present

## 2015-07-09 DIAGNOSIS — K5080 Crohn's disease of both small and large intestine without complications: Principal | ICD-10-CM | POA: Diagnosis present

## 2015-07-09 DIAGNOSIS — M129 Arthropathy, unspecified: Secondary | ICD-10-CM | POA: Diagnosis present

## 2015-07-09 DIAGNOSIS — R1030 Lower abdominal pain, unspecified: Secondary | ICD-10-CM

## 2015-07-09 DIAGNOSIS — K50918 Crohn's disease, unspecified, with other complication: Secondary | ICD-10-CM

## 2015-07-09 LAB — COMPREHENSIVE METABOLIC PANEL
ALBUMIN: 4.7 g/dL (ref 3.5–5.0)
ALT: 12 U/L — ABNORMAL LOW (ref 17–63)
ANION GAP: 13 (ref 5–15)
AST: 20 U/L (ref 15–41)
Alkaline Phosphatase: 39 U/L (ref 38–126)
BUN: 8 mg/dL (ref 6–20)
CHLORIDE: 103 mmol/L (ref 101–111)
CO2: 24 mmol/L (ref 22–32)
Calcium: 10.3 mg/dL (ref 8.9–10.3)
Creatinine, Ser: 0.83 mg/dL (ref 0.61–1.24)
GFR calc Af Amer: >60 mL/min (ref >60)
GLUCOSE: 92 mg/dL (ref 65–99)
POTASSIUM: 3.7 mmol/L (ref 3.5–5.1)
Sodium: 140 mmol/L (ref 135–145)
Total Bilirubin: 0.8 mg/dL (ref 0.3–1.2)
Total Protein: 8.9 g/dL — ABNORMAL HIGH (ref 6.5–8.1)

## 2015-07-09 LAB — CBC WITH DIFFERENTIAL/PLATELET
BASOS ABS: 0 10*3/uL (ref 0.0–0.1)
Basophils Relative: 0 %
Eosinophils Absolute: 0.3 10*3/uL (ref 0.0–0.7)
Eosinophils Relative: 2 %
HCT: 35.5 % — ABNORMAL LOW (ref 39.0–52.0)
Hemoglobin: 11.6 g/dL — ABNORMAL LOW (ref 13.0–17.0)
LYMPHS ABS: 1.2 10*3/uL (ref 0.7–4.0)
Lymphocytes Relative: 9 %
MCH: 23 pg — ABNORMAL LOW (ref 26.0–34.0)
MCHC: 32.7 g/dL (ref 30.0–36.0)
MCV: 70.3 fL — ABNORMAL LOW (ref 78.0–100.0)
MONO ABS: 0.8 10*3/uL (ref 0.1–1.0)
Monocytes Relative: 6 %
Neutro Abs: 10.8 10*3/uL — ABNORMAL HIGH (ref 1.7–7.7)
Neutrophils Relative %: 83 %
PLATELETS: 460 10*3/uL — AB (ref 150–400)
RBC: 5.05 MIL/uL (ref 4.22–5.81)
RDW: 14.7 % (ref 11.5–15.5)
WBC: 13.1 10*3/uL — AB (ref 4.0–10.5)

## 2015-07-09 LAB — SEDIMENTATION RATE: SED RATE: 50 mm/h — AB (ref 0–16)

## 2015-07-09 LAB — LIPASE, BLOOD: LIPASE: 28 U/L (ref 11–51)

## 2015-07-09 LAB — C DIFFICILE QUICK SCREEN W PCR REFLEX
C DIFFICILE (CDIFF) INTERP: NEGATIVE
C DIFFICILE (CDIFF) TOXIN: NEGATIVE
C Diff antigen: NEGATIVE

## 2015-07-09 LAB — C-REACTIVE PROTEIN: CRP: 3.9 mg/dL — ABNORMAL HIGH (ref <1.0)

## 2015-07-09 MED ORDER — METHYLPREDNISOLONE SODIUM SUCC 125 MG IJ SOLR
125.0000 mg | Freq: Once | INTRAMUSCULAR | Status: AC
  Administered 2015-07-09: 125 mg via INTRAVENOUS
  Filled 2015-07-09: qty 2

## 2015-07-09 MED ORDER — HYDROMORPHONE HCL 1 MG/ML IJ SOLN
1.0000 mg | Freq: Once | INTRAMUSCULAR | Status: AC
  Administered 2015-07-09: 1 mg via INTRAVENOUS
  Filled 2015-07-09: qty 1

## 2015-07-09 MED ORDER — METHYLPREDNISOLONE SODIUM SUCC 40 MG IJ SOLR
40.0000 mg | Freq: Every day | INTRAMUSCULAR | Status: DC
  Administered 2015-07-10: 40 mg via INTRAVENOUS
  Filled 2015-07-09: qty 1

## 2015-07-09 MED ORDER — DICYCLOMINE HCL 10 MG PO CAPS
10.0000 mg | ORAL_CAPSULE | Freq: Three times a day (TID) | ORAL | Status: DC
  Administered 2015-07-10 – 2015-07-12 (×8): 10 mg via ORAL
  Filled 2015-07-09 (×10): qty 1

## 2015-07-09 MED ORDER — AMLODIPINE BESYLATE 5 MG PO TABS
5.0000 mg | ORAL_TABLET | Freq: Every day | ORAL | Status: DC
  Administered 2015-07-09 – 2015-07-12 (×4): 5 mg via ORAL
  Filled 2015-07-09 (×4): qty 1

## 2015-07-09 MED ORDER — SODIUM CHLORIDE 0.9 % IV BOLUS (SEPSIS)
1000.0000 mL | Freq: Once | INTRAVENOUS | Status: AC
  Administered 2015-07-09: 1000 mL via INTRAVENOUS

## 2015-07-09 MED ORDER — ACETAMINOPHEN 325 MG PO TABS: 650.0000 mg | ORAL_TABLET | Freq: Four times a day (QID) | ORAL | Status: DC | PRN

## 2015-07-09 MED ORDER — DIATRIZOATE MEGLUMINE & SODIUM 66-10 % PO SOLN
30.0000 mL | Freq: Once | ORAL | Status: AC
  Administered 2015-07-09: 30 mL via ORAL

## 2015-07-09 MED ORDER — ONDANSETRON HCL 4 MG/2ML IJ SOLN
4.0000 mg | Freq: Four times a day (QID) | INTRAMUSCULAR | Status: DC | PRN
Start: 2015-07-09 — End: 2015-07-12

## 2015-07-09 MED ORDER — OXYCODONE HCL 5 MG PO TABS
5.0000 mg | ORAL_TABLET | ORAL | Status: DC | PRN
  Administered 2015-07-09 – 2015-07-12 (×10): 10 mg via ORAL
  Filled 2015-07-09 (×10): qty 2

## 2015-07-09 MED ORDER — ONDANSETRON HCL 4 MG/2ML IJ SOLN
4.0000 mg | Freq: Once | INTRAMUSCULAR | Status: AC
  Administered 2015-07-09: 4 mg via INTRAVENOUS
  Filled 2015-07-09: qty 2

## 2015-07-09 MED ORDER — HYDRALAZINE HCL 20 MG/ML IJ SOLN: 10.0000 mg | Freq: Four times a day (QID) | INTRAMUSCULAR | Status: DC | PRN

## 2015-07-09 MED ORDER — SODIUM CHLORIDE 0.9 % IV SOLN
INTRAVENOUS | Status: DC
  Administered 2015-07-09 – 2015-07-12 (×8): via INTRAVENOUS

## 2015-07-09 MED ORDER — PANTOPRAZOLE SODIUM 40 MG PO TBEC
40.0000 mg | DELAYED_RELEASE_TABLET | Freq: Two times a day (BID) | ORAL | Status: DC
  Administered 2015-07-09 – 2015-07-12 (×6): 40 mg via ORAL
  Filled 2015-07-09 (×7): qty 1

## 2015-07-09 MED ORDER — ONDANSETRON HCL 4 MG PO TABS: 4.0000 mg | ORAL_TABLET | Freq: Four times a day (QID) | ORAL | Status: DC | PRN

## 2015-07-09 MED ORDER — IOPAMIDOL (ISOVUE-300) INJECTION 61%
100.0000 mL | Freq: Once | INTRAVENOUS | Status: AC | PRN
  Administered 2015-07-09: 100 mL via INTRAVENOUS

## 2015-07-09 MED ORDER — MORPHINE SULFATE (PF) 2 MG/ML IV SOLN
2.0000 mg | INTRAVENOUS | Status: DC | PRN
  Administered 2015-07-09 – 2015-07-12 (×9): 2 mg via INTRAVENOUS
  Filled 2015-07-09 (×9): qty 1

## 2015-07-09 NOTE — ED Provider Notes (Signed)
CSN: [id number]     Arrival date & time 07/09/15  1057 History   First MD Initiated Contact with Patient 07/09/15 1150     Chief Complaint  Patient presents with  . Abdominal Pain     (Consider location/radiation/quality/duration/timing/severity/associated sxs/prior Treatment) HPI Comments: Pt using his humira and azulfidine w/o relief Endorses increased weakness  Patient is a 30 y.o. male presenting with abdominal pain. The history is provided by the patient.  Abdominal Pain Pain location:  LLQ Pain quality: sharp   Pain severity:  Moderate Onset quality:  Sudden Duration:  4 weeks Timing:  Constant Progression:  Worsening Relieved by:  Nothing Worsened by:  Nothing tried Ineffective treatments:  None tried Associated symptoms: diarrhea and melena   Associated symptoms: no fever and no vomiting     Past Medical History  Diagnosis Date  . Crohn disease (HCC)   . Anemia     required transfusion of bood in 2008 or 2009  . 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   . Internal hemorrhoids   . Hypertension    Past Surgical History  Procedure Laterality Date  . Neg hx     Family History  Problem Relation Age of Onset  . Diabetes      Both sides  . Heart disease      both sides  . Breast cancer Maternal Grandmother   . Prostate cancer Paternal Grandfather   . Colon cancer Neg Hx   . Diabetes Mellitus II Mother   . Hypertension Father   . Diabetes Mellitus II Father    Social History  Substance Use Topics  . Smoking status: Never Smoker   . Smokeless tobacco: Never Used  . Alcohol Use: 0.0 oz/week    0 Standard drinks or equivalent per week     Comment: rarely    Review of Systems  Constitutional: Negative for fever.  Gastrointestinal: Positive for abdominal pain, diarrhea and melena. Negative for vomiting.  All other systems reviewed and are negative.     Allergies  Remicade  Home Medications   Prior  to Admission medications   Medication Sig Start Date End Date Taking? Authorizing Provider  acetaminophen (TYLENOL) 325 MG tablet Take 650 mg by mouth daily as needed for moderate pain or headache.   Yes Historical Provider, MD  Adalimumab (HUMIRA) 40 MG/0.8ML PSKT Inject 40 mg into the skin every 14 (fourteen) days.   Yes Historical Provider, MD  calcium carbonate (TUMS EX) 750 MG chewable tablet Chew 2-3 tablets by mouth 2 (two) times daily as needed for heartburn.   Yes Historical Provider, MD  hydrochlorothiazide (MICROZIDE) 12.5 MG capsule Take 1 capsule (12.5 mg total) by mouth daily. Reported on 05/19/2015 05/19/15  Yes Josalyn Funches, MD  hydrOXYzine (ATARAX/VISTARIL) 50 MG tablet Take 25-50 mg by mouth at bedtime as needed for anxiety.   Yes Historical Provider, MD  ibuprofen (ADVIL,MOTRIN) 200 MG tablet Take 200 mg by mouth daily as needed for moderate pain.   Yes Historical Provider, MD  pantoprazole (PROTONIX) 40 MG tablet Take 1 tablet (40 mg total) by mouth daily. Reported on 05/19/2015 05/19/15  Yes Josalyn Funches, MD  simethicone (MYLICON) 125 MG chewable tablet Chew 1 tablet (125 mg total) by mouth every 6 (six) hours as needed for flatulence. Reported on 05/19/2015 05/19/15  Yes Josalyn Funches, MD  sulfaSALAzine (AZULFIDINE) 500 MG tablet Take 2 tablets (1,000 mg total) by mouth 2 (  two) times daily. 05/19/15  Yes Josalyn Funches, MD   BP 147/115 mmHg  Pulse 88  Temp(Src) 98.1 F (36.7 C) (Oral)  Resp 16  Ht  (1.88 m)  Wt 77.111 kg  BMI 21.82 kg/m2  SpO2 100% Physical Exam  Constitutional: He is oriented to person, place, and time. He appears well-developed and well-nourished.  Non-toxic appearance. No distress.  HENT:  Head: Normocephalic and atraumatic.  Eyes: Conjunctivae, EOM and lids are normal. Pupils are equal, round, and reactive to light.  Neck: Normal range of motion. Neck supple. No tracheal deviation present. No thyroid mass present.  Cardiovascular: Normal  rate, regular rhythm and normal heart sounds.  Exam reveals no gallop.   No murmur heard. Pulmonary/Chest: Effort normal and breath sounds normal. No stridor. No respiratory distress. He has no decreased breath sounds. He has no wheezes. He has no rhonchi. He has no rales.  Abdominal: Soft. Normal appearance and bowel sounds are normal. He exhibits no distension. There is tenderness in the left lower quadrant. There is no rigidity, no rebound, no guarding and no CVA tenderness.    Musculoskeletal: Normal range of motion. He exhibits no edema or tenderness.  Neurological: He is alert and oriented to person, place, and time. He has normal strength. No cranial nerve deficit or sensory deficit. GCS eye subscore is 4. GCS verbal subscore is 5. GCS motor subscore is 6.  Skin: Skin is warm and dry. No abrasion and no rash noted.  Psychiatric: He has a normal mood and affect. His speech is normal and behavior is normal.  Nursing note and vitals reviewed.   ED Course  Procedures (including critical care time) Labs Review Labs Reviewed  CBC WITH DIFFERENTIAL/PLATELET  COMPREHENSIVE METABOLIC PANEL  LIPASE, BLOOD    Imaging Review No results found. I have personally reviewed and evaluated these images and lab results as part of my medical decision-making.   EKG Interpretation None      MDM   Final diagnoses:  None  Patient given dose of IV Solu-Medrol as well as pain medication. Continues to endorse pain after reassessment will be admitted for Crohn's exacerbation    Lorre Nick, MD 07/09/15 1534

## 2015-07-09 NOTE — Consult Note (Signed)
Referring Provider: Dr. Gwenlyn Perking Primary Care Physician:  Lora Paula, MD Primary Gastroenterologist:  ? Gentry Fitz; saw Dr. Marina Goodell in the past but now being followed at [facility] by Dr. Merri Brunette  Reason for Consultation:  Crohn's disease  HPI: Troy Ramirez is a 30 y.o. male known previously to Dr. Yancey Flemings with history of Crohn's ileo-colitis. He was initially diagnosed in 2006 and has been seen by several gastroenterologists over the years. He had been seen by Dr. Gwenith Spitz at [facility] and was cared for there prior to establishing with Dr. Marina Goodell. He had hospitalization in 2015 with an exacerbation of his Crohn's colitis. 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 He is also had prior issues with narcotic addiction. Been on methadone at one point which apparently worked well but he stopped this in the fall of 2014. He had not been to our office since July 2015 until 04/2014 when he was admitted to the hospital and decision was made for him to continue care at a tertiary care facility.  Since that time he has been seen at [facility] under the care of Dr. Merri Brunette?  Is on Humira again (? 60 mg every other week), but has not actually been seen since about August 2016 but says that he has continued his medication.  Patient has been through several medication regimens including infliximab to which he had an infusion reaction. He took Humira for short period of time in the past but discontinued it due to insurance issues. He also took Cimzia short term and apparently tolerated this but says that his insurance company denied payment after the initial induction phase. He had been started on methotrexate in 2013 per Dr. Nicola Police he says helped his arthralgias quite a bit but made him nauseated.   He has intentions to continue to follow at [facility].  Was apparently supposed to have a colonoscopy there about 6 months ago since his last procedure was 2012 or 2013 but says that he got  sick with the flu and did not have it performed.  Is here now with symptoms of Crohn's flare, mostly abdominal pain but some increased frequency of stooling as well.  Cdiff is negative.  CT scan of the abdomen and pelvis with contrast shows the following:  IMPRESSION: Mucosal thickening of the colon and terminal ileum consistent with Crohn's disease. This appears more prominent than in 2016 but unchanged as compared to 2015. No other acute abnormality. Chronic sacroiliitis consistent with inflammatory bowel disease.  Was given a dose of solumedrol 125 mg in the ED and has been started on 40 mg daily as inpatient.  Has anxiety and depression and apparently does not tolerate steroids well (has a lot of mood changes and interferes with ability to interact with other people).  Says that it seems like the first week after getting his dose of medication he does better and then it wears off and the second week he feels poorly (last dose was 4/42).   Past Medical History  Diagnosis Date  . Crohn disease (HCC)   . Anemia     required transfusion of bood in 2008 or 2009  . 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   . Internal hemorrhoids   . Hypertension     Past Surgical History  Procedure Laterality Date  . Neg hx      Prior to Admission medications  Medication Sig Start Date End Date Taking? Authorizing Provider  acetaminophen (TYLENOL) 325 MG tablet Take 650 mg by mouth daily as needed for moderate pain or headache.   Yes Historical Provider, MD  Adalimumab (HUMIRA) 40 MG/0.8ML PSKT Inject 40 mg into the skin every 14 (fourteen) days.   Yes Historical Provider, MD  calcium carbonate (TUMS EX) 750 MG chewable tablet Chew 2-3 tablets by mouth 2 (two) times daily as needed for heartburn.   Yes Historical Provider, MD  hydrochlorothiazide (MICROZIDE) 12.5 MG capsule Take 1 capsule (12.5 mg total) by mouth daily. Reported on 05/19/2015 05/19/15   Yes Josalyn Funches, MD  hydrOXYzine (ATARAX/VISTARIL) 50 MG tablet Take 25-50 mg by mouth at bedtime as needed for anxiety.   Yes Historical Provider, MD  ibuprofen (ADVIL,MOTRIN) 200 MG tablet Take 200 mg by mouth daily as needed for moderate pain.   Yes Historical Provider, MD  pantoprazole (PROTONIX) 40 MG tablet Take 1 tablet (40 mg total) by mouth daily. Reported on 05/19/2015 05/19/15  Yes Josalyn Funches, MD  simethicone (MYLICON) 125 MG chewable tablet Chew 1 tablet (125 mg total) by mouth every 6 (six) hours as needed for flatulence. Reported on 05/19/2015 05/19/15  Yes Josalyn Funches, MD  sulfaSALAzine (AZULFIDINE) 500 MG tablet Take 2 tablets (1,000 mg total) by mouth 2 (two) times daily. 05/19/15  Yes Dessa Phi, MD    Current Facility-Administered Medications  Medication Dose Route Frequency Provider Last Rate Last Dose  . 0.9 %  sodium chloride infusion   Intravenous Continuous Lorre Nick, MD 125 mL/hr at 07/09/15 1526     Current Outpatient Prescriptions  Medication Sig Dispense Refill  . acetaminophen (TYLENOL) 325 MG tablet Take 650 mg by mouth daily as needed for moderate pain or headache.    . Adalimumab (HUMIRA) 40 MG/0.8ML PSKT Inject 40 mg into the skin every 14 (fourteen) days.    . calcium carbonate (TUMS EX) 750 MG chewable tablet Chew 2-3 tablets by mouth 2 (two) times daily as needed for heartburn.    . hydrochlorothiazide (MICROZIDE) 12.5 MG capsule Take 1 capsule (12.5 mg total) by mouth daily. Reported on 05/19/2015 30 capsule 5  . hydrOXYzine (ATARAX/VISTARIL) 50 MG tablet Take 25-50 mg by mouth at bedtime as needed for anxiety.    Marland Kitchen ibuprofen (ADVIL,MOTRIN) 200 MG tablet Take 200 mg by mouth daily as needed for moderate pain.    . pantoprazole (PROTONIX) 40 MG tablet Take 1 tablet (40 mg total) by mouth daily. Reported on 05/19/2015 30 tablet 5  . simethicone (MYLICON) 125 MG chewable tablet Chew 1 tablet (125 mg total) by mouth every 6 (six) hours as needed  for flatulence. Reported on 05/19/2015 30 tablet 5  . sulfaSALAzine (AZULFIDINE) 500 MG tablet Take 2 tablets (1,000 mg total) by mouth 2 (two) times daily. 120 tablet 5    Allergies as of 07/09/2015 - Review Complete 07/09/2015  Allergen Reaction Noted  . Remicade [infliximab] Anaphylaxis and Swelling 08/29/2012    Family History  Problem Relation Age of Onset  . Diabetes      Both sides  . Heart disease      both sides  . Breast cancer Maternal Grandmother   . Prostate cancer Paternal Grandfather   . Colon cancer Neg Hx   . Diabetes Mellitus II Mother   . Hypertension Father   . Diabetes Mellitus II Father     Social History   Social History  . Marital Status: Single    Spouse Name:  N/A  . Number of Children: 0  . Years of Education: N/A   Occupational History  . manager    Social History Main Topics  . Smoking status: Never Smoker   . Smokeless tobacco: Never Used  . Alcohol Use: 0.0 oz/week    0 Standard drinks or equivalent per week     Comment: rarely  . Drug Use: Yes    Special: Marijuana     Comment: last used about two hr ago 05/19/2015  . Sexual Activity: Yes   Other Topics Concern  . Not on file   Social History Narrative    Review of Systems: Ten point ROS is O/W negative except as mentioned in HPI.  Physical Exam: Vital signs in last 24 hours: Temp:  [98 F (36.7 C)-98.1 F (36.7 C)] 98 F (36.7 C) (05/03 1556) Pulse Rate:  [75-88] 86 (05/03 1600) Resp:  [16-18] 18 (05/03 1556) BP: (143-162)/(88-115) 160/106 mmHg (05/03 1600) SpO2:  [95 %-100 %] 99 % (05/03 1600) Weight:  [170 lb (77.111 kg)] 170 lb (77.111 kg) (05/03 1101)   General:  Alert, Well-developed, well-nourished, pleasant and cooperative in NAD Head:  Normocephalic and atraumatic. Eyes:  Sclera clear, no icterus.  Conjunctiva pink. Ears:  Normal auditory acuity. Mouth:  No deformity or lesions.   Lungs:  Clear throughout to auscultation.  No wheezes, crackles, or rhonchi.    Heart:  Regular rate and rhythm; no murmurs, clicks, rubs, or gallops. Abdomen:  Soft, non-distended.  BS present.  Mild TTP.   Rectal:  Deferred  Msk:  Symmetrical without gross deformities.  Pulses:  Normal pulses noted. Extremities:  Without clubbing or edema. Neurologic:  Alert and oriented x 4;  grossly normal neurologically. Skin:  Intact without significant lesions or rashes. Psych:  Alert and cooperative. Normal mood and affect.  Lab Results:  Recent Labs  07/09/15 1150  WBC 13.1*  HGB 11.6*  HCT 35.5*  PLT 460*   BMET  Recent Labs  07/09/15 1150  NA 140  K 3.7  CL 103  CO2 24  GLUCOSE 92  BUN 8  CREATININE 0.83  CALCIUM 10.3   LFT  Recent Labs  07/09/15 1150  PROT 8.9*  ALBUMIN 4.7  AST 20  ALT 12*  ALKPHOS 39  BILITOT 0.8   Studies/Results: Ct Abdomen Pelvis W Contrast  07/09/2015  CLINICAL DATA:  Left lower quadrant pain.  Crohn's disease. EXAM: CT ABDOMEN AND PELVIS WITH CONTRAST TECHNIQUE: Multidetector CT imaging of the abdomen and pelvis was performed using the standard protocol following bolus administration of intravenous contrast. CONTRAST:  ISOVUE-300 IOPAMIDOL (ISOVUE-300) INJECTION 61% COMPARISON:  04/30/2014 AND 08/28/2013 FINDINGS: Lower chest:  Negative. Hepatobiliary: Normal. Pancreas: Normal. Spleen: Normal. Adrenals/Urinary Tract: Normal. Stomach/Bowel: There is prominent mucosal thickening in the colon particularly in the hepatic flexure and in the distal descending and proximal sigmoid portions of the colon. This appears to have increased since the study of 2016 but appears similar to the study of 2015. There is also chronic mucosal thickening of the terminal ileum. No dilated loops of large or small bowel. Vascular/Lymphatic: Vessels appear normal. Multiple small lymph nodes in the mesenteries in the right mid abdomen, unchanged. Reproductive: Normal. Other: No free air or free fluid. Musculoskeletal: No acute abnormality. Sclerosis  of the sacroiliac joints consistent with the history of inflammatory bowel disease. IMPRESSION: Mucosal thickening of the colon and terminal ileum consistent with Crohn's disease. This appears more prominent than in 2016 but unchanged as compared  to 2015. No other acute abnormality. Chronic sacroiliitis consistent with inflammatory bowel disease. Electronically Signed   By: Francene Boyers M.D.   On: 07/09/2015 15:06   IMPRESSION:  #1 30 yo male with Crohns Ileocolitis dx 2006 -now back on Humira by physicians at [facility]. 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 #4 poor tolerance of steroids with mood changes #5 anxiety/depression  PLAN: -Continue IV steroids; will need discharged with steroid taper. -Will check CMV PCR. -Continue full liquid diet for now.  ZEHR, JESSICA D.  07/09/2015, 4:32 PM  Pager number [phone]    Attending physician's note   I have taken a history, examined the patient and reviewed the chart. I agree with the Advanced Practitioner's note, impression and recommendations. 30 year old male with history of Crohn's disease diagnosed in 2006 currently following with a gastroenterologist at [facility] on Humira presented with abdominal pain. Reviewed the findings on CT abdomen and pelvis. He has seen multiple gastroenterologists in the region in the past and has been noncompliant. He was scheduled to undergo colonoscopy at [facility] in September but was canceled per patient as he was sick. He reports having symptoms of week after his Humira dose, he may be losing efficacy and developing antibodies against adalimumab. He has follow-up appointment with GI at [facility] on Monday. Currently has no evidence of intra-abdominal abscesses. He is doing better on prednisone 40 mg daily. Can discharge on prednisone taper. Advance diet as tolerated   K Scherry Ran, MD [phone] Mon-Fri  8a-5p [phone] after 5p, weekends, holidays

## 2015-07-09 NOTE — ED Notes (Signed)
Patient here from home with complaints abd pain. Hx of Crohns. States that this is a "flare up". No insurance, unable to see primary. Currently take Humira.

## 2015-07-09 NOTE — H&P (Signed)
History and Physical    Troy Ramirez YIR:[id number] DOB: 1985/07/10 DOA: 07/09/2015  Referring Provider: Dr. [name] PCP: Minerva Ends, MD   Outpatient Specialists:  GI (At [facility])   Patient coming from: Home  Chief Complaint: abd pain, hematochezia, anorexia and diarrhea  HPI: Troy Ramirez is a 30 y.o. male with PMH significant for essential HTN, AOCD and crohn's disease; who presented to ED secondary to worsening abd pain and hematochezia. Patient reports symptoms has been present for the last 4 weeks or so; but has worsen steadily over the last 10 days. His pain is mainly in bilateral lower quadrants, crampy, 10/10 in intensity, essentially constant, but with cramps wavy patterns; endorses associated nausea and 2 isolated episodes of non-bloody vomiting. Patient episodes of hematochezia are intermittent. Also endorses diarrhea and anorexia. Patient denies CP, SOB, dysuria, hematemesis, HA's, focal weakness or any other acute complaints.   ED Course: patient had CT abd and pelvis that suggested ongoing crohn's ileitis/colitis; positive elevation of WBC's and signs of dehydration on physical exam. Patient was initiated on IVF's, PRN analgesics and one dose of solumedrol given. TR[facility] called to admit patient for further management of crohn's exacerbation.  Review of Systems:  All other systems reviewed and apart from HPI, are negative.  Past Medical History  Diagnosis Date  . Crohn disease ([place])   . Anemia     required transfusion of bood in 2008 or 2009  . 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   . Internal hemorrhoids   . Hypertension     Past Surgical History  Procedure Laterality Date  . Neg hx       reports that he has never smoked. He has never used smokeless tobacco. He reports that he drinks alcohol (socially). He reports that he has used illicit drugs (Marijuana; none recently).  Allergies  Allergen Reactions  .  Remicade [Infliximab] Anaphylaxis and Swelling    Family History  Problem Relation Age of Onset  . Diabetes      Both sides  . Heart disease      both sides  . Breast cancer Maternal Grandmother   . Prostate cancer Paternal Grandfather   . Colon cancer Neg Hx   . Diabetes Mellitus II Mother   . Hypertension Father   . Diabetes Mellitus II Father     Prior to Admission medications   Medication Sig Start Date End Date Taking? Authorizing Provider  acetaminophen (TYLENOL) 325 MG tablet Take 650 mg by mouth daily as needed for moderate pain or headache.   Yes Historical Provider, MD  Adalimumab (HUMIRA) 40 MG/0.8ML PSKT Inject 40 mg into the skin every 14 (fourteen) days.   Yes Historical Provider, MD  calcium carbonate (TUMS EX) 750 MG chewable tablet Chew 2-3 tablets by mouth 2 (two) times daily as needed for heartburn.   Yes Historical Provider, MD  hydrochlorothiazide (MICROZIDE) 12.5 MG capsule Take 1 capsule (12.5 mg total) by mouth daily. Reported on 05/19/2015 05/19/15  Yes Josalyn Funches, MD  hydrOXYzine (ATARAX/VISTARIL) 50 MG tablet Take 25-50 mg by mouth at bedtime as needed for anxiety.   Yes Historical Provider, MD  ibuprofen (ADVIL,MOTRIN) 200 MG tablet Take 200 mg by mouth daily as needed for moderate pain.   Yes Historical Provider, MD  pantoprazole (PROTONIX) 40 MG tablet Take 1 tablet (40 mg total) by mouth daily. Reported on 05/19/2015 05/19/15  Yes Boykin Nearing, MD  simethicone [facility])  125 MG chewable tablet Chew 1 tablet (125 mg total) by mouth every 6 (six) hours as needed for flatulence. Reported on 05/19/2015 05/19/15  Yes Josalyn Funches, MD  sulfaSALAzine (AZULFIDINE) 500 MG tablet Take 2 tablets (1,000 mg total) by mouth 2 (two) times daily. 05/19/15  Yes Boykin Nearing, MD    Physical Exam: Filed Vitals:   07/09/15 1330 07/09/15 1400 07/09/15 1556 07/09/15 1600  BP: 146/88 148/96 148/108 160/106  Pulse: 75 87 85 86  Temp:   98 F (36.7 C)   TempSrc:    Oral   Resp:   18   Height:      Weight:      SpO2: 99% 98% 100% 99%    Constitutional: mild distress due to ongoing pain in his abdomen. No fever, no CP and currently no complaining of nausea or vomiting.  Eyes: PERRLA, lids and conjunctivae normal, no icterus or nystagmus  ENMT: Mucous membranes are dry. Posterior pharynx clear of any exudate or lesions. Normal dentition. No Thrush  Neck: normal, supple, no masses, no thyromegaly, no JVD Respiratory: clear to auscultation bilaterally, no wheezing, no crackles. Normal respiratory effort. No accessory muscle use.  Cardiovascular: S1 & S2 heard, regular rate and rhythm, no murmurs / rubs / gallops. 2+ pedal pulses. No carotid bruits.  Abdomen: No distension, mild guarding; tenderness to palpation of bilateral lower quadrants and epigastric region. No masses palpated. Positive Bowel sounds normal.  Musculoskeletal: no clubbing / cyanosis, no edema. No joint deformity upper and lower extremities. Good ROM, no contractures. Normal muscle tone.  Skin: no rashes, lesions, ulcers. No induration appreciated Neurologic: CN 2-12 grossly intact. Sensation intact, DTR normal. Strength 5/5 in all 4 limbs.  Psychiatric: Normal judgment and insight. Alert and oriented x 3. Normal mood.    Labs on Admission: I have personally reviewed following labs and imaging studies  CBC:  Recent Labs Lab 07/09/15 1150  WBC 13.1*  NEUTROABS 10.8*  HGB 11.6*  HCT 35.5*  MCV 70.3*  PLT 353*   Basic Metabolic Panel:  Recent Labs Lab 07/09/15 1150  NA 140  K 3.7  CL 103  CO2 24  GLUCOSE 92  BUN 8  CREATININE 0.83  CALCIUM 10.3   GFR: Estimated Creatinine Clearance: 141.9 mL/min (by C-G formula based on Cr of 0.83).   Liver Function Tests:  Recent Labs Lab 07/09/15 1150  AST 20  ALT 12*  ALKPHOS 39  BILITOT 0.8  PROT 8.9*  ALBUMIN 4.7    Recent Labs Lab 07/09/15 1150  LIPASE 28   Urine analysis:    Component Value Date/Time    COLORURINE YELLOW 04/11/2014 1659   APPEARANCEUR CLEAR 04/11/2014 1659   LABSPEC 1.013 04/11/2014 1659   PHURINE 6.5 04/11/2014 1659   GLUCOSEU NEGATIVE 04/11/2014 1659   HGBUR TRACE* 04/11/2014 1659   BILIRUBINUR NEGATIVE 04/11/2014 1659   KETONESUR 40* 04/11/2014 1659   PROTEINUR NEGATIVE 04/11/2014 1659   UROBILINOGEN 0.2 04/11/2014 1659   NITRITE NEGATIVE 04/11/2014 1659   LEUKOCYTESUR NEGATIVE 04/11/2014 1659    Radiological Exams on Admission: Ct Abdomen Pelvis W Contrast  07/09/2015  CLINICAL DATA:  Left lower quadrant pain.  Crohn's disease. EXAM: CT ABDOMEN AND PELVIS WITH CONTRAST TECHNIQUE: Multidetector CT imaging of the abdomen and pelvis was performed using the standard protocol following bolus administration of intravenous contrast. CONTRAST:  173m ISOVUE-300 IOPAMIDOL (ISOVUE-300) INJECTION 61% COMPARISON:  04/30/2014 AND 08/28/2013 FINDINGS: Lower chest:  Negative. Hepatobiliary: Normal. Pancreas: Normal. Spleen: Normal. Adrenals/Urinary Tract:  Normal. Stomach/Bowel: There is prominent mucosal thickening in the colon particularly in the hepatic flexure and in the distal descending and proximal sigmoid portions of the colon. This appears to have increased since the study of 2016 but appears similar to the study of 2015. There is also chronic mucosal thickening of the terminal ileum. No dilated loops of large or small bowel. Vascular/Lymphatic: Vessels appear normal. Multiple small lymph nodes in the mesenteries in the right mid abdomen, unchanged. Reproductive: Normal. Other: No free air or free fluid. Musculoskeletal: No acute abnormality. Sclerosis of the sacroiliac joints consistent with the history of inflammatory bowel disease. IMPRESSION: Mucosal thickening of the colon and terminal ileum consistent with Crohn's disease. This appears more prominent than in 2016 but unchanged as compared to 2015. No other acute abnormality. Chronic sacroiliitis consistent with inflammatory bowel  disease. Electronically Signed   By: Lorriane Shire M.D.   On: 07/09/2015 15:06    EKG:  None  Assessment/Plan 1-abd pain, nausea, bloody diarrhea: secondary to Exacerbation of Crohn's disease ([place]) -CT scan revealing inflammatory changes suggestive of crohn's ileitis and colitis -will admit for IVF's, IV steroids, PRN analgesics and will have GI consulted for assistance treating his exacerbation. -patient will need as much assistance and help/resources at discharge -will allow Full liquid diet -will check ESR and CRP -empiric initiation of Bentyl, to assist with spasm/pain -will Check C. Diff  2-Anemia of chronic disease:  -Hgb 11.6 -but patient dehydrated and with active hematochezia -will follow Hgb trend and transfuse as needed -will avoid heparin products for now  3-dehydration: -Due to poor PO intake and ongoing diarrhea -will provide IVF's -holding HCTZ for now -replete electrolytes as needed   4-Hematochezia: -most likely associated with exacerbation of his crohn's -r/o C. Diff infection as well -will follow Hgb trend -GI consulted (doubt needs for colonoscopy at this time)  5-HTN (hypertension): slightly out of control -pain most likely contributing -HCTZ on hold due to ongoing dehydration picture -will use amlodipine and PRN hydralazine -will attempt to control pain  6-GERD (gastroesophageal reflux disease) -continue PPI  7-Leukocytosis: -will check C. Diff -patient afebrile and w/o complaints of any other symptoms that suggest source of infection -most likely demargination  -will provide IVF's and follow trend -might increase with use of steroids    DVT prophylaxis: SCD's Code Status: Full code Family Communication: girlfriend at bedside Disposition Plan: home when medically stable, I will anticipate 2-3 days Consults called: GI ([facility]) Admission status: inpatient, med-surg bed, LOS > 2 midnights   Time: 70 minutes   Barton Dubois  MD [facility] Pager [phone]  If 7PM-7AM, please contact night-coverage [email or web address] Password [email or web address]  07/09/2015, 8:30 PM

## 2015-07-10 LAB — COMPREHENSIVE METABOLIC PANEL
ALK PHOS: 32 U/L — AB (ref 38–126)
ALT: 11 U/L — AB (ref 17–63)
AST: 14 U/L — AB (ref 15–41)
Albumin: 3.8 g/dL (ref 3.5–5.0)
Anion gap: 10 (ref 5–15)
BUN: 7 mg/dL (ref 6–20)
CALCIUM: 8.9 mg/dL (ref 8.9–10.3)
CO2: 23 mmol/L (ref 22–32)
CREATININE: 0.56 mg/dL — AB (ref 0.61–1.24)
Chloride: 105 mmol/L (ref 101–111)
GFR calc Af Amer: >60 mL/min (ref >60)
GFR calc non Af Amer: >60 mL/min (ref >60)
GLUCOSE: 101 mg/dL — AB (ref 65–99)
Potassium: 3.9 mmol/L (ref 3.5–5.1)
Sodium: 138 mmol/L (ref 135–145)
Total Bilirubin: 0.8 mg/dL (ref 0.3–1.2)
Total Protein: 7.4 g/dL (ref 6.5–8.1)

## 2015-07-10 LAB — CBC
HCT: 32.1 % — ABNORMAL LOW (ref 39.0–52.0)
Hemoglobin: 10.3 g/dL — ABNORMAL LOW (ref 13.0–17.0)
MCH: 22.7 pg — AB (ref 26.0–34.0)
MCHC: 32.1 g/dL (ref 30.0–36.0)
MCV: 70.7 fL — AB (ref 78.0–100.0)
PLATELETS: 396 10*3/uL (ref 150–400)
RBC: 4.54 MIL/uL (ref 4.22–5.81)
RDW: 14.6 % (ref 11.5–15.5)
WBC: 14 10*3/uL — ABNORMAL HIGH (ref 4.0–10.5)

## 2015-07-10 LAB — MAGNESIUM: Magnesium: 1.8 mg/dL (ref 1.7–2.4)

## 2015-07-10 LAB — PHOSPHORUS: Phosphorus: 3.7 mg/dL (ref 2.5–4.6)

## 2015-07-10 MED ORDER — PREDNISONE 20 MG PO TABS
40.0000 mg | ORAL_TABLET | Freq: Every day | ORAL | Status: DC
  Administered 2015-07-11 – 2015-07-12 (×2): 40 mg via ORAL
  Filled 2015-07-10 (×3): qty 2

## 2015-07-10 MED ORDER — SIMETHICONE 80 MG PO CHEW
160.0000 mg | CHEWABLE_TABLET | Freq: Four times a day (QID) | ORAL | Status: DC | PRN
  Administered 2015-07-10 – 2015-07-11 (×4): 160 mg via ORAL
  Filled 2015-07-10 (×4): qty 2

## 2015-07-10 NOTE — Progress Notes (Signed)
PROGRESS NOTE    Troy Ramirez  ZOX:[id number] DOB: May 20, 1985 DOA: 07/09/2015  PCP: Lora Paula, MD  Outpatient Specialists:  GI [facility]    Brief Narrative:  Troy Ramirez is a 30 y.o. male with PMH significant for essential HTN, AOCD and crohn's disease; who presented to ED secondary to worsening abd pain and hematochezia. Patient reports symptoms has been present for the last 4 weeks or so; but has worsen steadily over the last 10 days. His pain is mainly in bilateral lower quadrants, crampy, 10/10 in intensity, essentially constant, but with cramps wavy patterns; endorses associated nausea and 2 isolated episodes of non-bloody vomiting. Patient episodes of hematochezia are intermittent  Assessment & Plan:   Principal Problem:   Exacerbation of Crohn's disease   - on Humira and Sulfasalazine at home- CT shows colonic and terminal ileal involvement- see CT below - has not followed up with his GI doctor at [facility] in 1 yr.  Colonoscopy was planned last year but he states to me that the Humira was making him sick and so he could not undergo the procedure - given IV Solumedrol - GI recommends d/c on Prednisone taper tomorrow - he has an appt on Monday with his GI doctor - cont IVF for today- follow stool output   Active Problems:   Anemia of chronic disease    HTN (hypertension) - Norvasc    GERD (gastroesophageal reflux disease) Protonix BID while on steroids    DVT prophylaxis: SCDs Code Status: full code Family Communication:  Disposition Plan: home when diarrhea improves   Consultants:   GI  Procedures:   none  Antimicrobials:  Anti-infectives    None      Subjective: Still having abdominal pain across lower abdomen and some nausea, no vomiting. Urinating well.   Objective: Filed Vitals:   07/09/15 2113 07/09/15 2155 07/10/15 0539 07/10/15 1352  BP: 159/87 150/79 143/91 134/70  Pulse: 96 78 79 75  Temp: 98.2 F (36.8 C) 98.4 F (36.9 C) 97.8 F  (36.6 C) 98.1 F (36.7 C)  TempSrc: Oral Oral Oral Oral  Resp: 18 18 18 18   Height:      Weight:      SpO2: 100% 98% 100% 100%    Intake/Output Summary (Last 24 hours) at 07/10/15 1635 Last data filed at 07/10/15 0900  Gross per 24 hour  Intake    240 ml  Output      0 ml  Net    240 ml   Filed Weights   07/09/15 1101  Weight: 77.111 kg (170 lb)    Examination: General exam: Appears comfortable  HEENT: PERRLA, oral mucosa moist, no sclera icterus or thrush Respiratory system: Clear to auscultation. Respiratory effort normal. Cardiovascular system: S1 & S2 heard, RRR.  No murmurs  Gastrointestinal system: Abdomen soft, tender diffusely, nondistended. Normal bowel sound. No organomegaly Central nervous system: Alert and oriented. No focal neurological deficits. Extremities: No cyanosis, clubbing or edema Skin: No rashes or ulcers Psychiatry:  Mood & affect appropriate.     Data Reviewed: I have personally reviewed following labs and imaging studies  CBC:  Recent Labs Lab 07/09/15 1150 07/10/15 0513  WBC 13.1* 14.0*  NEUTROABS 10.8*  --   HGB 11.6* 10.3*  HCT 35.5* 32.1*  MCV 70.3* 70.7*  PLT 460* 396   Basic Metabolic Panel:  Recent Labs Lab 07/09/15 1150 07/10/15 0513  NA 140 138  K 3.7 3.9  CL 103 105  CO2 24 23  GLUCOSE 92 101*  BUN 8 7  CREATININE 0.83 0.56*  CALCIUM 10.3 8.9  MG  --  1.8  PHOS  --  3.7   GFR: Estimated Creatinine Clearance: 147.2 mL/min (by C-G formula based on Cr of 0.56). Liver Function Tests:  Recent Labs Lab 07/09/15 1150 07/10/15 0513  AST 20 14*  ALT 12* 11*  ALKPHOS 39 32*  BILITOT 0.8 0.8  PROT 8.9* 7.4  ALBUMIN 4.7 3.8    Recent Labs Lab 07/09/15 1150  LIPASE 28   No results for input(s): AMMONIA in the last 168 hours. Coagulation Profile: No results for input(s): INR, PROTIME in the last 168 hours. Cardiac Enzymes: No results for input(s): CKTOTAL, CKMB, CKMBINDEX, TROPONINI in the last 168  hours. BNP (last 3 results) No results for input(s): PROBNP in the last 8760 hours. HbA1C: No results for input(s): HGBA1C in the last 72 hours. CBG: No results for input(s): GLUCAP in the last 168 hours. Lipid Profile: No results for input(s): CHOL, HDL, LDLCALC, TRIG, CHOLHDL, LDLDIRECT in the last 72 hours. Thyroid Function Tests: No results for input(s): TSH, T4TOTAL, FREET4, T3FREE, THYROIDAB in the last 72 hours. Anemia Panel: No results for input(s): VITAMINB12, FOLATE, FERRITIN, TIBC, IRON, RETICCTPCT in the last 72 hours. Urine analysis:    Component Value Date/Time   COLORURINE YELLOW 04/11/2014 1659   APPEARANCEUR CLEAR 04/11/2014 1659   LABSPEC 1.013 04/11/2014 1659   PHURINE 6.5 04/11/2014 1659   GLUCOSEU NEGATIVE 04/11/2014 1659   HGBUR TRACE* 04/11/2014 1659   BILIRUBINUR NEGATIVE 04/11/2014 1659   KETONESUR 40* 04/11/2014 1659   PROTEINUR NEGATIVE 04/11/2014 1659   UROBILINOGEN 0.2 04/11/2014 1659   NITRITE NEGATIVE 04/11/2014 1659   LEUKOCYTESUR NEGATIVE 04/11/2014 1659   Sepsis Labs: @LABRCNTIP (procalcitonin:4,lacticidven:4)  ) Recent Results (from the past 240 hour(s))  C difficile quick scan w PCR reflex     Status: None   Collection Time: 07/09/15  8:00 PM  Result Value Ref Range Status   C Diff antigen NEGATIVE NEGATIVE Final   C Diff toxin NEGATIVE NEGATIVE Final   C Diff interpretation Negative for toxigenic C. difficile  Final         Radiology Studies: Ct Abdomen Pelvis W Contrast  07/09/2015  CLINICAL DATA:  Left lower quadrant pain.  Crohn's disease. EXAM: CT ABDOMEN AND PELVIS WITH CONTRAST TECHNIQUE: Multidetector CT imaging of the abdomen and pelvis was performed using the standard protocol following bolus administration of intravenous contrast. CONTRAST:  ISOVUE-300 IOPAMIDOL (ISOVUE-300) INJECTION 61% COMPARISON:  04/30/2014 AND 08/28/2013 FINDINGS: Lower chest:  Negative. Hepatobiliary: Normal. Pancreas: Normal. Spleen: Normal.  Adrenals/Urinary Tract: Normal. Stomach/Bowel: There is prominent mucosal thickening in the colon particularly in the hepatic flexure and in the distal descending and proximal sigmoid portions of the colon. This appears to have increased since the study of 2016 but appears similar to the study of 2015. There is also chronic mucosal thickening of the terminal ileum. No dilated loops of large or small bowel. Vascular/Lymphatic: Vessels appear normal. Multiple small lymph nodes in the mesenteries in the right mid abdomen, unchanged. Reproductive: Normal. Other: No free air or free fluid. Musculoskeletal: No acute abnormality. Sclerosis of the sacroiliac joints consistent with the history of inflammatory bowel disease. IMPRESSION: Mucosal thickening of the colon and terminal ileum consistent with Crohn's disease. This appears more prominent than in 2016 but unchanged as compared to 2015. No other acute abnormality. Chronic sacroiliitis consistent with inflammatory bowel disease. Electronically Signed   By: Fayrene Fearing  Maxwell M.D.   On: 07/09/2015 15:06        Scheduled Meds: . amLODipine  5 mg Oral Daily  . dicyclomine  10 mg Oral TID AC  . pantoprazole  40 mg Oral BID  . [START ON 07/11/2015] predniSONE  40 mg Oral Q breakfast   Continuous Infusions: . sodium chloride 125 mL/hr at 07/10/15 0653     LOS: 1 day    Time spent in minutes: 35    Eldena Dede, MD [facility] Pager: [email or web address] Password [facility] 07/10/2015, 4:35 PM

## 2015-07-10 NOTE — Care Management Note (Signed)
Case Management Note  Patient Details  Name: Troy Ramirez MRN: [id number] Date of Birth: [birth year]  Subjective/Objective: 30 y/oi m admitted w/Crohns. From home. 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                   Action/Plan:d/c plan home.   Expected Discharge Date:   (unknown)               Expected Discharge Plan:  Home/Self Care  In-House Referral:     Discharge planning Services  CM Consult, Indigent [facility] Clinic  Post Acute Care Choice:    Choice offered to:     DME Arranged:    DME Agency:     HH Arranged:    HH Agency:     Status of Service:  In process, will continue to follow  Medicare Important Message Given:    Date Medicare IM Given:    Medicare IM give by:    Date Additional Medicare IM Given:    Additional Medicare Important Message give by:     If discussed at Long Length of Stay Meetings, dates discussed:    Additional Comments:  Lanier Clam, RN 07/10/2015, 3:59 PM

## 2015-07-10 NOTE — Progress Notes (Signed)
Initial Nutrition Assessment  INTERVENTION:   Provided diet education  Discussed with patient non-whey protein options for protein supplementation RD to continue to monitor  NUTRITION DIAGNOSIS:   Inadequate oral intake related to nausea, other (see comment), poor appetite (diarrhea) as evidenced by per patient/family report.  GOAL:   Patient will meet greater than or equal to 90% of their needs  MONITOR:   PO intake, Labs, Weight trends, I & O's  REASON FOR ASSESSMENT:   Consult Assessment of nutrition requirement/status  ASSESSMENT:   30 y.o. male with PMH significant for essential HTN, AOCD and crohn's disease; who presented to ED secondary to worsening abd pain and hematochezia. Patient reports symptoms has been present for the last 4 weeks or so; but has worsen steadily over the last 10 days. His pain is mainly in bilateral lower quadrants, crampy, 10/10 in intensity, essentially constant, but with cramps wavy patterns; endorses associated nausea and 2 isolated episodes of non-bloody vomiting. Patient episodes of hematochezia are intermittent. Also endorses diarrhea and anorexia.  Patient reports poor appetite, nausea and diarrhea that has worsened over the past 10 days, overall his symptoms began 1 month ago. Pt has been unable to eat much the past 10 days. He states he has had diet education in the past, he tries to avoid fibrous foods and dairy products. He states he cannot tolerate whey protein products. Provided patient with products that are plant based proteins. 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 Pt took notes with his phone. Pt states he has been having low energy, he added coffee back into his diet recently. RD discouraged the use of high caffeine drinks like coffee or energy drinks. Pt states he wants to stay away from artificial sweeteners. Provided suggestions of ways to flavor water with no sweeteners. Suggested patient try  probiotics and soy yogurt products.  Pt has been consuming 50% of his full liquid diet.   Per weight history, pt weighs 170 lb this admission. Pt states that he has not been weighed this admission and that is a guess. He has not weighed himself recently. States he will try and get someone to weigh him when he goes to walk. Cannot assess weight status at this time.  Medications reviewed. Labs reviewed: Mg/Phos WNL  Diet Order:  Diet full liquid Room service appropriate?: Yes; Fluid consistency:: Thin  Skin:  Reviewed, no issues  Last BM:  5/3  Height:   Ht Readings from Last 1 Encounters:  07/09/15 6\' 2"  (1.88 m)    Weight:   Wt Readings from Last 1 Encounters:  07/09/15 170 lb (77.111 kg)    Ideal Body Weight:  86.4 kg  BMI:  Body mass index is 21.82 kg/(m^2).  Estimated Nutritional Needs:   Kcal:  2100-2300  Protein:  105-115g  Fluid:  2.1L/day  EDUCATION NEEDS:   Education needs addressed  Tilda Franco, MS, RD, LDN Pager: [phone] After Hours Pager: [phone]

## 2015-07-10 NOTE — Progress Notes (Signed)
Chaplain following due to spiritual care consult for Adv Directive.    Provided education around Advance Directive and support around decisions.   Pt and s/o present.  Pt wishes to discuss Advance Directive with family and sign in AM.  Chaplain will follow up to complete.     This chaplain familiar with Troy Ramirez from prior admissions.  Provided follow up support around stress in family system - twin brother with cerebral palsy, death of sister.  [place] is wishes to be a source of stability for his family.  Is excited that brother has found a s/o and is hopeful that brother will find a sustainable living situation.  Kimberly is also still in court cases related to a previous job.  He is aware of this stress affecting illness.    Belva Crome MDiv

## 2015-07-11 LAB — VITAMIN B12: Vitamin B-12: 489 pg/mL (ref 180–914)

## 2015-07-11 LAB — BASIC METABOLIC PANEL
Anion gap: 8 (ref 5–15)
BUN: 10 mg/dL (ref 6–20)
CALCIUM: 8.8 mg/dL — AB (ref 8.9–10.3)
CO2: 26 mmol/L (ref 22–32)
CREATININE: 0.62 mg/dL (ref 0.61–1.24)
Chloride: 106 mmol/L (ref 101–111)
GFR calc Af Amer: >60 mL/min (ref >60)
GLUCOSE: 101 mg/dL — AB (ref 65–99)
Potassium: 3.5 mmol/L (ref 3.5–5.1)
SODIUM: 140 mmol/L (ref 135–145)

## 2015-07-11 LAB — FERRITIN: Ferritin: 5 ng/mL — ABNORMAL LOW (ref 24–336)

## 2015-07-11 LAB — FOLATE: FOLATE: 9.9 ng/mL (ref >5.9)

## 2015-07-11 LAB — IRON AND TIBC
Iron: 10 ug/dL — ABNORMAL LOW (ref 45–182)
Saturation Ratios: 3 % — ABNORMAL LOW (ref 17.9–39.5)
TIBC: 396 ug/dL (ref 250–450)
UIBC: 386 ug/dL

## 2015-07-11 LAB — CBC
HEMATOCRIT: 31.3 % — AB (ref 39.0–52.0)
Hemoglobin: 9.8 g/dL — ABNORMAL LOW (ref 13.0–17.0)
MCH: 23.2 pg — ABNORMAL LOW (ref 26.0–34.0)
MCHC: 31.3 g/dL (ref 30.0–36.0)
MCV: 74 fL — ABNORMAL LOW (ref 78.0–100.0)
PLATELETS: 374 10*3/uL (ref 150–400)
RBC: 4.23 MIL/uL (ref 4.22–5.81)
RDW: 15.1 % (ref 11.5–15.5)
WBC: 11 10*3/uL — ABNORMAL HIGH (ref 4.0–10.5)

## 2015-07-11 LAB — RETICULOCYTES
RBC.: 4.41 MIL/uL (ref 4.22–5.81)
RETIC COUNT ABSOLUTE: 44.1 10*3/uL (ref 19.0–186.0)
Retic Ct Pct: 1 % (ref 0.4–3.1)

## 2015-07-11 MED ORDER — SULFASALAZINE 500 MG PO TABS
1000.0000 mg | ORAL_TABLET | Freq: Two times a day (BID) | ORAL | Status: DC
  Administered 2015-07-11 – 2015-07-12 (×2): 1000 mg via ORAL
  Filled 2015-07-11 (×3): qty 2

## 2015-07-11 MED ORDER — LIP MEDEX EX OINT
TOPICAL_OINTMENT | CUTANEOUS | Status: AC
  Administered 2015-07-11: 02:00:00
  Filled 2015-07-11: qty 7

## 2015-07-11 NOTE — Progress Notes (Signed)
PROGRESS NOTE    Troy Ramirez  FIE:[id number] DOB: [birth year] DOA: 07/09/2015  PCP: Lora Paula, MD  Outpatient Specialists:  [facility]    Brief Narrative:  Troy Ramirez is a 30 y.o. male with PMH significant for essential HTN, AOCD and crohn's disease; who presented to ED secondary to worsening abd pain and hematochezia. Patient reports symptoms has been present for the last 4 weeks or so; but has worsen steadily over the last 10 days. His pain is mainly in bilateral lower quadrants, crampy, 10/10 in intensity, essentially constant, but with cramps wavy patterns; endorses associated nausea and 2 isolated episodes of non-bloody vomiting. Patient episodes of hematochezia are intermittent  Assessment & Plan:   Principal Problem:   Exacerbation of Crohn's disease   - on Humira and Sulfasalazine at home- CT shows colonic and terminal ileal involvement- see CT below - has not followed up with his GI doctor at [facility] in 1 yr.  Colonoscopy was planned last year but he states to me that the Humira was making him sick and so he could not undergo the procedure - given IV Solumedrol - GI recommends d/c on Prednisone taper tomorrow - he has an appt on Monday with his GI doctor - cont IVF as he states he has had 3 BMs already this AM- bleeding improving- pain is still bad   Active Problems:   Anemia of chronic disease - Hb dropped from 11.6 to 9.8 in 2 days possibly from acute blood loss and hydration- appears hemo concentrated on admission-  check anemia panel - likely has iron deficiency as well   Dehydration - stopped HCTZ and continue IVF    HTN (hypertension) - Norvasc- hold HCTZ    GERD (gastroesophageal reflux disease) - Protonix BID while on steroids    DVT prophylaxis: SCDs Code Status: full code Family Communication:  Disposition Plan: home when diarrhea improves   Consultants:   GI  Procedures:   none  Antimicrobials:  Anti-infectives    None       Subjective: 3 BMs this AM by the time I evaluated him at 10 AM. Still having abdominal pain- has a hot pack on his abdomen. Blood in stool is decreasing. Eating and drinking ok.  Objective: Filed Vitals:   07/10/15 0539 07/10/15 1352 07/10/15 2145 07/11/15 0623  BP: 143/91 134/70 132/70 143/89  Pulse: 79 75 88 82  Temp: 97.8 F (36.6 C) 98.1 F (36.7 C) 97.8 F (36.6 C) 97.4 F (36.3 C)  TempSrc: Oral Oral Oral Oral  Resp: 18 18 17 18   Height:      Weight:      SpO2: 100% 100% 99% 100%    Intake/Output Summary (Last 24 hours) at 07/11/15 1158 Last data filed at 07/11/15 1660  Gross per 24 hour  Intake    720 ml  Output      0 ml  Net    720 ml   Filed Weights   07/09/15 1101  Weight: 77.111 kg (170 lb)    Examination: General exam: Appears comfortable  HEENT: PERRLA, oral mucosa moist, no sclera icterus or thrush Respiratory system: Clear to auscultation. Respiratory effort normal. Cardiovascular system: S1 & S2 heard, RRR.  No murmurs  Gastrointestinal system: Abdomen soft, tender in RLQ, nondistended. Normal bowel sound. No organomegaly Central nervous system: Alert and oriented. No focal neurological deficits. Extremities: No cyanosis, clubbing or edema Skin: No rashes or ulcers Psychiatry:  Mood & affect appropriate.  Data Reviewed: I have personally reviewed following labs and imaging studies  CBC:  Recent Labs Lab 07/09/15 1150 07/10/15 0513 07/11/15 0801  WBC 13.1* 14.0* 11.0*  NEUTROABS 10.8*  --   --   HGB 11.6* 10.3* 9.8*  HCT 35.5* 32.1* 31.3*  MCV 70.3* 70.7* 74.0*  PLT 460* 396 374   Basic Metabolic Panel:  Recent Labs Lab 07/09/15 1150 07/10/15 0513 07/11/15 0801  NA 140 138 140  K 3.7 3.9 3.5  CL 103 105 106  CO2 GLUCOSE 92 101* 101*  BUN CREATININE 0.83 0.56* 0.62  CALCIUM 10.3 8.9 8.8*  MG  --  1.8  --   PHOS  --  3.7  --    GFR: Estimated Creatinine Clearance: 147.2 mL/min (by C-G formula  based on Cr of 0.62). Liver Function Tests:  Recent Labs Lab 07/09/15 1150 07/10/15 0513  AST 20 14*  ALT 12* 11*  ALKPHOS 39 32*  BILITOT 0.8 0.8  PROT 8.9* 7.4  ALBUMIN 4.7 3.8    Recent Labs Lab 07/09/15 1150  LIPASE 28   No results for input(s): AMMONIA in the last 168 hours. Coagulation Profile: No results for input(s): INR, PROTIME in the last 168 hours. Cardiac Enzymes: No results for input(s): CKTOTAL, CKMB, CKMBINDEX, TROPONINI in the last 168 hours. BNP (last 3 results) No results for input(s): PROBNP in the last 8760 hours. HbA1C: No results for input(s): HGBA1C in the last 72 hours. CBG: No results for input(s): GLUCAP in the last 168 hours. Lipid Profile: No results for input(s): CHOL, HDL, LDLCALC, TRIG, CHOLHDL, LDLDIRECT in the last 72 hours. Thyroid Function Tests: No results for input(s): TSH, T4TOTAL, FREET4, T3FREE, THYROIDAB in the last 72 hours. Anemia Panel: No results for input(s): VITAMINB12, FOLATE, FERRITIN, TIBC, IRON, RETICCTPCT in the last 72 hours. Urine analysis:    Component Value Date/Time   COLORURINE YELLOW 04/11/2014 1659   APPEARANCEUR CLEAR 04/11/2014 1659   LABSPEC 1.013 04/11/2014 1659   PHURINE 6.5 04/11/2014 1659   GLUCOSEU NEGATIVE 04/11/2014 1659   HGBUR TRACE* 04/11/2014 1659   BILIRUBINUR NEGATIVE 04/11/2014 1659   KETONESUR 40* 04/11/2014 1659   PROTEINUR NEGATIVE 04/11/2014 1659   UROBILINOGEN 0.2 04/11/2014 1659   NITRITE NEGATIVE 04/11/2014 1659   LEUKOCYTESUR NEGATIVE 04/11/2014 1659   Sepsis Labs: (procalcitonin:4,lacticidven:4)  ) Recent Results (from the past 240 hour(s))  C difficile quick scan w PCR reflex     Status: None   Collection Time: 07/09/15  8:00 PM  Result Value Ref Range Status   C Diff antigen NEGATIVE NEGATIVE Final   C Diff toxin NEGATIVE NEGATIVE Final   C Diff interpretation Negative for toxigenic C. difficile  Final         Radiology Studies: Ct Abdomen Pelvis W  Contrast  07/09/2015  CLINICAL DATA:  Left lower quadrant pain.  Crohn's disease. EXAM: CT ABDOMEN AND PELVIS WITH CONTRAST TECHNIQUE: Multidetector CT imaging of the abdomen and pelvis was performed using the standard protocol following bolus administration of intravenous contrast. CONTRAST:  ISOVUE-300 IOPAMIDOL (ISOVUE-300) INJECTION 61% COMPARISON:  04/30/2014 AND 08/28/2013 FINDINGS: Lower chest:  Negative. Hepatobiliary: Normal. Pancreas: Normal. Spleen: Normal. Adrenals/Urinary Tract: Normal. Stomach/Bowel: There is prominent mucosal thickening in the colon particularly in the hepatic flexure and in the distal descending and proximal sigmoid portions of the colon. This appears to have increased since the study of 2016 but appears similar to the study of 2015. There is  also chronic mucosal thickening of the terminal ileum. No dilated loops of large or small bowel. Vascular/Lymphatic: Vessels appear normal. Multiple small lymph nodes in the mesenteries in the right mid abdomen, unchanged. Reproductive: Normal. Other: No free air or free fluid. Musculoskeletal: No acute abnormality. Sclerosis of the sacroiliac joints consistent with the history of inflammatory bowel disease. IMPRESSION: Mucosal thickening of the colon and terminal ileum consistent with Crohn's disease. This appears more prominent than in 2016 but unchanged as compared to 2015. No other acute abnormality. Chronic sacroiliitis consistent with inflammatory bowel disease. Electronically Signed   By: Francene Boyers M.D.   On: 07/09/2015 15:06        Scheduled Meds: . amLODipine  5 mg Oral Daily  . dicyclomine  10 mg Oral TID AC  . pantoprazole  40 mg Oral BID  . predniSONE  40 mg Oral Q breakfast   Continuous Infusions: . sodium chloride 125 mL/hr at 07/11/15 0727     LOS: 2 days    Time spent in minutes: 35    Zamirah Denny, MD [facility] Pager: [email or web address] Password [email or web address] 07/11/2015, 11:58 AM

## 2015-07-12 DIAGNOSIS — D509 Iron deficiency anemia, unspecified: Secondary | ICD-10-CM

## 2015-07-12 LAB — CBC
HEMATOCRIT: 29.8 % — AB (ref 39.0–52.0)
HEMOGLOBIN: 9.4 g/dL — AB (ref 13.0–17.0)
MCH: 22.5 pg — ABNORMAL LOW (ref 26.0–34.0)
MCHC: 31.5 g/dL (ref 30.0–36.0)
MCV: 71.5 fL — ABNORMAL LOW (ref 78.0–100.0)
Platelets: 350 10*3/uL (ref 150–400)
RBC: 4.17 MIL/uL — AB (ref 4.22–5.81)
RDW: 14.7 % (ref 11.5–15.5)
WBC: 11 10*3/uL — ABNORMAL HIGH (ref 4.0–10.5)

## 2015-07-12 MED ORDER — SODIUM CHLORIDE 0.9 % IV SOLN
250.0000 mg | Freq: Once | INTRAVENOUS | Status: AC
  Administered 2015-07-12: 250 mg via INTRAVENOUS
  Filled 2015-07-12: qty 20

## 2015-07-12 MED ORDER — AMLODIPINE BESYLATE 5 MG PO TABS: 10.0000 mg | ORAL_TABLET | Freq: Every day | ORAL | Status: DC

## 2015-07-12 MED ORDER — FERROUS SULFATE 325 (65 FE) MG PO TABS: 325.0000 mg | ORAL_TABLET | Freq: Two times a day (BID) | ORAL | Status: DC

## 2015-07-12 MED ORDER — OXYCODONE HCL 5 MG PO TABS: 5.0000 mg | ORAL_TABLET | Freq: Four times a day (QID) | ORAL | Status: DC | PRN

## 2015-07-12 MED ORDER — PANTOPRAZOLE SODIUM 40 MG PO TBEC: 40.0000 mg | DELAYED_RELEASE_TABLET | Freq: Two times a day (BID) | ORAL | Status: DC

## 2015-07-12 MED ORDER — PREDNISONE 20 MG PO TABS: 40.0000 mg | ORAL_TABLET | Freq: Every day | ORAL | Status: DC

## 2015-07-12 NOTE — Progress Notes (Signed)
Patient discharged.  Leaving with 5 prescriptions.  Denies pain.  A&O x4.  Room air.  Reports understanding of discharge instructions.  Leaving with girlfriend.  Leaving with personal belongings.  No complaints.

## 2015-07-12 NOTE — Discharge Summary (Signed)
Physician Discharge Summary  Troy Ramirez WJX:[id number] DOB: [birth year] DOA: 07/09/2015  PCP: Lora Paula, MD  Admit date: 07/09/2015 Discharge date: 07/12/2015  Time spent: 50 minutes  Recommendations for Outpatient Follow-up:  1. F/u Hb and Iron levels    Discharge Condition: stable    Discharge Diagnoses:  Principal Problem:   Exacerbation of Crohn's disease (HCC) Active Problems:   Anemia of chronic disease   Crohn's disease (HCC)   Hematochezia   HTN (hypertension)   GERD (gastroesophageal reflux disease)   History of present illness:  Tarance Balan is a 30 y.o. male with PMH significant for essential HTN, AOCD and crohn's disease; who presented to ED secondary to worsening abd pain and hematochezia. Patient reports symptoms has been present for the last 4 weeks or so; but has worsen steadily over the last 10 days. His pain is mainly in bilateral lower quadrants, crampy, 10/10 in intensity, essentially constant, but with cramps wavy patterns; endorses associated nausea and 2 isolated episodes of non-bloody vomiting. Patient episodes of hematochezia are intermittent  Hospital Course:  Principal Problem:  Exacerbation of Crohn's disease  - on Humira and Sulfasalazine at home- CT shows colonic and terminal ileal involvement- see CT below - has not followed up with his GI doctor at [facility] in 1 yr. Colonoscopy was planned last year but he states to me that the Humira was making him sick and so he could not undergo the procedure - given IV Solumedrol - GI recommends d/c on Prednisone - he has an appt on Monday with his GI doctor- I have told him to discuss a taper with them    Active Problems:  Anemia of chronic disease - Hb dropped from 11.6 to 9.8 in 2 days possibly from acute blood loss and hydration- appears hemo concentrated on admission- checked anemia panel - has iron deficiency- ferreting is 5- given 500 mg of Nuclecit and placed on oral Iron  Dehydration -  stopped HCTZ    HTN (hypertension) - Norvasc-  Stop HCTZ   GERD (gastroesophageal reflux disease) - BID PPI while on Prednisone  Procedures:  none  Consultations:  GI  Discharge Exam: Filed Weights   07/09/15 1101  Weight: 77.111 kg (170 lb)   Filed Vitals:   07/11/15 2051 07/12/15 0521  BP: 151/95 143/94  Pulse: 77 62  Temp: 98.3 F (36.8 C) 98.2 F (36.8 C)  Resp: 18 18    General: AAO x 3, no distress Cardiovascular: RRR, no murmurs  Respiratory: clear to auscultation bilaterally GI: soft, non-tender, non-distended, bowel sound positive  Discharge Instructions You were cared for by a hospitalist during your hospital stay. If you have any questions about your discharge medications or the care you received while you were in the hospital after you are discharged, you can call the unit and asked to speak with the hospitalist on call if the hospitalist that took care of you is not available. Once you are discharged, your primary care physician will handle any further medical issues. Please note that NO REFILLS for any discharge medications will be authorized once you are discharged, as it is imperative that you return to your primary care physician (or establish a relationship with a primary care physician if you do not have one) for your aftercare needs so that they can reassess your need for medications and monitor your lab values.      Discharge Instructions    Diet - low sodium heart healthy    Complete by:  As directed      Increase activity slowly    Complete by:  As directed             Medication List    STOP taking these medications        hydrochlorothiazide 12.5 MG capsule  Commonly known as:  MICROZIDE      TAKE these medications        acetaminophen 325 MG tablet  Commonly known as:  TYLENOL  Take 650 mg by mouth daily as needed for moderate pain or headache.     amLODipine 5 MG tablet  Commonly known as:  NORVASC  Take 2 tablets (10 mg  total) by mouth daily.     calcium carbonate 750 MG chewable tablet  Commonly known as:  TUMS EX  Chew 2-3 tablets by mouth 2 (two) times daily as needed for heartburn.     ferrous sulfate 325 (65 FE) MG tablet  Take 1 tablet (325 mg total) by mouth 2 (two) times daily with a meal.     HUMIRA 40 MG/0.8ML Pskt  Generic drug:  Adalimumab  Inject 40 mg into the skin every 14 (fourteen) days.     hydrOXYzine 50 MG tablet  Commonly known as:  ATARAX/VISTARIL  Take 25-50 mg by mouth at bedtime as needed for anxiety.     ibuprofen 200 MG tablet  Commonly known as:  ADVIL,MOTRIN  Take 200 mg by mouth daily as needed for moderate pain.     oxyCODONE 5 MG immediate release tablet  Commonly known as:  Oxy IR/ROXICODONE  Take 1 tablet (5 mg total) by mouth every 6 (six) hours as needed for moderate pain.     pantoprazole 40 MG tablet  Commonly known as:  PROTONIX  Take 1 tablet (40 mg total) by mouth 2 (two) times daily before a meal. Reported on 05/19/2015     predniSONE 20 MG tablet  Commonly known as:  DELTASONE  Take 2 tablets (40 mg total) by mouth daily with breakfast.     simethicone 125 MG chewable tablet  Commonly known as:  MYLICON  Chew 1 tablet (125 mg total) by mouth every 6 (six) hours as needed for flatulence. Reported on 05/19/2015     sulfaSALAzine 500 MG tablet  Commonly known as:  AZULFIDINE  Take 2 tablets (1,000 mg total) by mouth 2 (two) times daily.       Allergies  Allergen Reactions  . Remicade [Infliximab] Anaphylaxis and Swelling      The results of significant diagnostics from this hospitalization (including imaging, microbiology, ancillary and laboratory) are listed below for reference.    Significant Diagnostic Studies: Ct Abdomen Pelvis W Contrast  07/09/2015  CLINICAL DATA:  Left lower quadrant pain.  Crohn's disease. EXAM: CT ABDOMEN AND PELVIS WITH CONTRAST TECHNIQUE: Multidetector CT imaging of the abdomen and pelvis was performed using the  standard protocol following bolus administration of intravenous contrast. CONTRAST:  ISOVUE-300 IOPAMIDOL (ISOVUE-300) INJECTION 61% COMPARISON:  04/30/2014 AND 08/28/2013 FINDINGS: Lower chest:  Negative. Hepatobiliary: Normal. Pancreas: Normal. Spleen: Normal. Adrenals/Urinary Tract: Normal. Stomach/Bowel: There is prominent mucosal thickening in the colon particularly in the hepatic flexure and in the distal descending and proximal sigmoid portions of the colon. This appears to have increased since the study of 2016 but appears similar to the study of 2015. There is also chronic mucosal thickening of the terminal ileum. No dilated loops of large or small bowel. Vascular/Lymphatic: Vessels appear normal. Multiple small lymph  nodes in the mesenteries in the right mid abdomen, unchanged. Reproductive: Normal. Other: No free air or free fluid. Musculoskeletal: No acute abnormality. Sclerosis of the sacroiliac joints consistent with the history of inflammatory bowel disease. IMPRESSION: Mucosal thickening of the colon and terminal ileum consistent with Crohn's disease. This appears more prominent than in 2016 but unchanged as compared to 2015. No other acute abnormality. Chronic sacroiliitis consistent with inflammatory bowel disease. Electronically Signed   By: Francene Boyers M.D.   On: 07/09/2015 15:06    Microbiology: Recent Results (from the past 240 hour(s))  C difficile quick scan w PCR reflex     Status: None   Collection Time: 07/09/15  8:00 PM  Result Value Ref Range Status   C Diff antigen NEGATIVE NEGATIVE Final   C Diff toxin NEGATIVE NEGATIVE Final   C Diff interpretation Negative for toxigenic C. difficile  Final     Labs: Basic Metabolic Panel:  Recent Labs Lab 07/09/15 1150 07/10/15 0513 07/11/15 0801  NA 140 138 140  K 3.7 3.9 3.5  CL 103 105 106  CO2 24 23 26   GLUCOSE 92 101* 101*  BUN 8 7 10   CREATININE 0.83 0.56* 0.62  CALCIUM 10.3 8.9 8.8*  MG  --  1.8  --    PHOS  --  3.7  --    Liver Function Tests:  Recent Labs Lab 07/09/15 1150 07/10/15 0513  AST 20 14*  ALT 12* 11*  ALKPHOS 39 32*  BILITOT 0.8 0.8  PROT 8.9* 7.4  ALBUMIN 4.7 3.8    Recent Labs Lab 07/09/15 1150  LIPASE 28   No results for input(s): AMMONIA in the last 168 hours. CBC:  Recent Labs Lab 07/09/15 1150 07/10/15 0513 07/11/15 0801 07/12/15 0542  WBC 13.1* 14.0* 11.0* 11.0*  NEUTROABS 10.8*  --   --   --   HGB 11.6* 10.3* 9.8* 9.4*  HCT 35.5* 32.1* 31.3* 29.8*  MCV 70.3* 70.7* 74.0* 71.5*  PLT 460* 396 374 350   Cardiac Enzymes: No results for input(s): CKTOTAL, CKMB, CKMBINDEX, TROPONINI in the last 168 hours. BNP: BNP (last 3 results) No results for input(s): BNP in the last 8760 hours.  ProBNP (last 3 results) No results for input(s): PROBNP in the last 8760 hours.  CBG: No results for input(s): GLUCAP in the last 168 hours.     SignedCalvert Cantor, MD [facility] 07/12/2015, 9:08 AM

## 2015-07-13 LAB — CMV DNA BY PCR, QUALITATIVE: CMV DNA QL PCR: NEGATIVE

## 2015-07-14 MED FILL — hydrOXYzine HCL 50 MG TABS: 50 | 30 days supply | Qty: 30 | Fill #0

## 2015-07-14 MED FILL — ?AMLODIPINE BESYLATE 5 MG T: 5 | 30 days supply | Qty: 60 | Fill #0

## 2015-07-16 MED FILL — predniSONE 10 MG TABS: 10 | 28 days supply | Qty: 70 | Fill #0

## 2015-08-14 MED FILL — ?AMLODIPINE BESYLATE 5 MG T: 5 | 30 days supply | Qty: 60 | Fill #1

## 2015-08-14 MED FILL — hydrOXYzine HCL 50 MG TABS: 50 | 30 days supply | Qty: 30 | Fill #1

## 2015-08-14 MED FILL — sulfaSALAzine 500 MG TABS: 500 | 30 days supply | Qty: 120 | Fill #2

## 2015-08-14 MED FILL — ?PANTOPRAZOLE SOD DR 40MG: 40 MG | 30 days supply | Qty: 30 | Fill #2

## 2015-09-17 ENCOUNTER — Encounter (HOSPITAL_BASED_OUTPATIENT_CLINIC_OR_DEPARTMENT_OTHER): Payer: Self-pay | Admitting: *Deleted

## 2015-09-17 ENCOUNTER — Emergency Department (HOSPITAL_BASED_OUTPATIENT_CLINIC_OR_DEPARTMENT_OTHER): Payer: Self-pay

## 2015-09-17 ENCOUNTER — Emergency Department (HOSPITAL_BASED_OUTPATIENT_CLINIC_OR_DEPARTMENT_OTHER)
Admission: EM | Admit: 2015-09-17 | Discharge: 2015-09-18 | Disposition: A | Discharge status: Home / Self Care | Payer: Self-pay | Attending: Emergency Medicine | Admitting: Emergency Medicine

## 2015-09-17 DIAGNOSIS — I10 Essential (primary) hypertension: Secondary | ICD-10-CM | POA: Insufficient documentation

## 2015-09-17 DIAGNOSIS — J40 Bronchitis, not specified as acute or chronic: Secondary | ICD-10-CM | POA: Insufficient documentation

## 2015-09-17 DIAGNOSIS — M1990 Unspecified osteoarthritis, unspecified site: Secondary | ICD-10-CM | POA: Insufficient documentation

## 2015-09-17 DIAGNOSIS — M25512 Pain in left shoulder: Secondary | ICD-10-CM | POA: Insufficient documentation

## 2015-09-17 DIAGNOSIS — R51 Headache: Secondary | ICD-10-CM | POA: Insufficient documentation

## 2015-09-17 DIAGNOSIS — M62830 Muscle spasm of back: Secondary | ICD-10-CM | POA: Insufficient documentation

## 2015-09-17 LAB — CBC WITH DIFFERENTIAL/PLATELET
Basophils Absolute: 0 10*3/uL (ref 0.0–0.1)
Basophils Relative: 0 %
Eosinophils Absolute: 0.2 10*3/uL (ref 0.0–0.7)
Eosinophils Relative: 1 %
HCT: 33.1 % — ABNORMAL LOW (ref 39.0–52.0)
Hemoglobin: 10.6 g/dL — ABNORMAL LOW (ref 13.0–17.0)
Lymphocytes Relative: 14 %
Lymphs Abs: 1.7 10*3/uL (ref 0.7–4.0)
MCH: 22.8 pg — ABNORMAL LOW (ref 26.0–34.0)
MCHC: 32 g/dL (ref 30.0–36.0)
MCV: 71.3 fL — ABNORMAL LOW (ref 78.0–100.0)
Monocytes Absolute: 0.9 10*3/uL (ref 0.1–1.0)
Monocytes Relative: 8 %
Neutro Abs: 9.3 10*3/uL — ABNORMAL HIGH (ref 1.7–7.7)
Neutrophils Relative %: 77 %
Platelets: 441 10*3/uL — ABNORMAL HIGH (ref 150–400)
RBC: 4.64 MIL/uL (ref 4.22–5.81)
RDW: 16.8 % — ABNORMAL HIGH (ref 11.5–15.5)
WBC: 12 10*3/uL — ABNORMAL HIGH (ref 4.0–10.5)

## 2015-09-17 LAB — BASIC METABOLIC PANEL
Anion gap: 9 (ref 5–15)
BUN: 10 mg/dL (ref 6–20)
CALCIUM: 9.4 mg/dL (ref 8.9–10.3)
CO2: 22 mmol/L (ref 22–32)
CREATININE: 0.8 mg/dL (ref 0.61–1.24)
Chloride: 105 mmol/L (ref 101–111)
GFR calc Af Amer: >60 mL/min (ref >60)
GLUCOSE: 90 mg/dL (ref 65–99)
Potassium: 3.1 mmol/L — ABNORMAL LOW (ref 3.5–5.1)
Sodium: 136 mmol/L (ref 135–145)

## 2015-09-17 LAB — TROPONIN I: Troponin I: <0.03 ng/mL (ref <0.03)

## 2015-09-17 MED ORDER — DIPHENHYDRAMINE HCL 50 MG/ML IJ SOLN
12.5000 mg | Freq: Once | INTRAMUSCULAR | Status: AC
  Administered 2015-09-18: 12.5 mg via INTRAVENOUS
  Filled 2015-09-17: qty 1

## 2015-09-17 MED ORDER — METOCLOPRAMIDE HCL 5 MG/ML IJ SOLN
10.0000 mg | Freq: Once | INTRAMUSCULAR | Status: AC
  Administered 2015-09-18: 10 mg via INTRAVENOUS
  Filled 2015-09-17: qty 2

## 2015-09-17 MED ORDER — SODIUM CHLORIDE 0.9 % IV BOLUS (SEPSIS)
1000.0000 mL | Freq: Once | INTRAVENOUS | Status: AC
  Administered 2015-09-18: 1000 mL via INTRAVENOUS

## 2015-09-17 MED ORDER — KETOROLAC TROMETHAMINE 30 MG/ML IJ SOLN
30.0000 mg | Freq: Once | INTRAMUSCULAR | Status: AC
  Administered 2015-09-18: 30 mg via INTRAVENOUS
  Filled 2015-09-17: qty 1

## 2015-09-17 NOTE — ED Notes (Signed)
Pt has multiple complaints.  He has been having intermittent headaches for two weeks, left shoulder blade pain, feels like he cannot take a deep breath, crohns pain, intermittent bilateral hand tingling and intermittent bilateral clamminess.

## 2015-09-17 NOTE — ED Notes (Signed)
Pt c/o Chest pain and upper back pain x 1 week , SOB

## 2015-09-17 NOTE — ED Provider Notes (Signed)
CSN: [id number]     Arrival date & time 09/17/15  2136 History  By signing my name below, I, Rosario Adie, attest that this documentation has been prepared under the direction and in the presence of Diontay Rosencrans, MD.  Electronically Signed: Rosario Adie, ED Scribe. 09/17/2015. 11:17 PM.   Chief Complaint  Patient presents with  . Chest Pain   Patient is a 30 y.o. male presenting with back pain. The history is provided by the patient. No language interpreter was used.  Back Pain Location:  Thoracic spine Quality:  Aching Radiates to:  Does not radiate Pain severity:  Moderate Pain is:  Same all the time Onset quality:  Gradual Duration:  2 weeks Timing:  Constant Progression:  Unchanged Chronicity:  New Context: not falling   Relieved by:  Nothing Worsened by:  Nothing tried Ineffective treatments:  None tried Associated symptoms: headaches   Associated symptoms: no chest pain, no numbness, no pelvic pain, no perianal numbness and no weakness  Fever: subjective.   Risk factors: no hx of cancer and no steroid use    HPI Comments: Troy Ramirez is a 30 y.o. male with a PMHx of Crohn's disease, Anemia, and HTN who presents to the Emergency Department with gradual onset, gradually worsening, constant left upper back pain onset 2 weeks ago. Pt has associated SOB, diaphoresis, subjective fever, slightly sore throat, mild productive cough with brown sputum, and intermittent headaches. He notes that he has a hx of headaches, but states that this episode since the onset of his other symptoms the headaches. His pain is worsened with sitting up and deep breaths.  He is taking 40mL daily of Humira for his hx of Chron's disease. No hx of PE/DVT, or recent long travel. He has been taking Tylenol, drinking more water, and resting more with no relief of his symptoms. He is complaining of any other symptoms or area of pain.   Past Medical History  Diagnosis Date  . Crohn disease (HCC)    . Anemia     required transfusion of bood in 2008 or 2009  . 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   . Internal hemorrhoids   . Hypertension    Past Surgical History  Procedure Laterality Date  . Neg hx     Family History  Problem Relation Age of Onset  . Diabetes      Both sides  . Heart disease      both sides  . Breast cancer Maternal Grandmother   . Prostate cancer Paternal Grandfather   . Colon cancer Neg Hx   . Diabetes Mellitus II Mother   . Hypertension Father   . Diabetes Mellitus II Father    Social History  Substance Use Topics  . Smoking status: Never Smoker   . Smokeless tobacco: Never Used  . Alcohol Use: 0.0 oz/week    0 Standard drinks or equivalent per week     Comment: rarely    Review of Systems  Constitutional: Negative for chills. Fever: subjective.  HENT: Positive for sore throat.   Respiratory: Positive for cough and shortness of breath. Negative for wheezing.   Cardiovascular: Negative for chest pain, palpitations and leg swelling.  Genitourinary: Negative for pelvic pain.  Musculoskeletal: Positive for back pain. Negative for myalgias, joint swelling and arthralgias.  Skin: Negative for rash and wound.  Neurological: Positive for headaches. Negative for facial asymmetry, speech difficulty,  weakness and numbness.  All other systems reviewed and are negative.  Allergies  Remicade  Home Medications   Prior to Admission medications   Medication Sig Start Date End Date Taking? Authorizing Provider  acetaminophen (TYLENOL) 325 MG tablet Take 650 mg by mouth daily as needed for moderate pain or headache.    Historical Provider, MD  Adalimumab (HUMIRA) 40 MG/0.8ML PSKT Inject 40 mg into the skin once a week.     Historical Provider, MD  amLODipine (NORVASC) 5 MG tablet Take 2 tablets (10 mg total) by mouth daily. 07/12/15   Calvert Cantor, MD  calcium carbonate (TUMS EX) 750 MG chewable tablet Chew  2-3 tablets by mouth 2 (two) times daily as needed for heartburn.    Historical Provider, MD  ferrous sulfate 325 (65 FE) MG tablet Take 1 tablet (325 mg total) by mouth 2 (two) times daily with a meal. 07/12/15   Calvert Cantor, MD  hydrOXYzine (ATARAX/VISTARIL) 50 MG tablet Take 25-50 mg by mouth at bedtime as needed for anxiety.    Historical Provider, MD  ibuprofen (ADVIL,MOTRIN) 200 MG tablet Take 200 mg by mouth daily as needed for moderate pain.    Historical Provider, MD  oxyCODONE (OXY IR/ROXICODONE) 5 MG immediate release tablet Take 1 tablet (5 mg total) by mouth every 6 (six) hours as needed for moderate pain. 07/12/15   Calvert Cantor, MD  pantoprazole (PROTONIX) 40 MG tablet Take 1 tablet (40 mg total) by mouth 2 (two) times daily before a meal. Reported on 05/19/2015 07/12/15   Calvert Cantor, MD  predniSONE (DELTASONE) 20 MG tablet Take 2 tablets (40 mg total) by mouth daily with breakfast. 07/12/15   Calvert Cantor, MD  simethicone (MYLICON) 125 MG chewable tablet Chew 1 tablet (125 mg total) by mouth every 6 (six) hours as needed for flatulence. Reported on 05/19/2015 05/19/15   Dessa Phi, MD  sulfaSALAzine (AZULFIDINE) 500 MG tablet Take 2 tablets (1,000 mg total) by mouth 2 (two) times daily. Patient taking differently: Take 2,000 mg by mouth 2 (two) times daily.  05/19/15   Josalyn Funches, MD   BP 165/91 mmHg  Pulse 91  Temp(Src) 99.1 F (37.3 C) (Oral)  Resp 22  Ht 6\' 2"  (1.88 m)  Wt 180 lb (81.647 kg)  BMI 23.10 kg/m2  SpO2 100%   Physical Exam  Constitutional: He appears well-developed and well-nourished.  HENT:  Head: Normocephalic.  Mouth/Throat: Oropharynx is clear and moist. No oropharyngeal exudate.  Eyes: Conjunctivae and EOM are normal. Pupils are equal, round, and reactive to light. Right eye exhibits no discharge. Left eye exhibits no discharge. No scleral icterus.  Neck: Normal range of motion. Neck supple. No JVD present. No tracheal deviation present.  Trachea is  midline.  Cardiovascular: Normal rate, regular rhythm, normal heart sounds and intact distal pulses.   No murmur heard. Pulmonary/Chest: Effort normal and breath sounds normal. No stridor. No respiratory distress. He has no wheezes. He has no rales.  Lungs CTA bilaterally.  Abdominal: Soft. Bowel sounds are normal. He exhibits no distension. There is no tenderness. There is no rebound and no guarding.  Musculoskeletal: Normal range of motion. He exhibits tenderness.  No step off or crepitus of the C, T, L, or S-spine. TTP to the T-spine and noted paraspinal T7 spasm.  Lymphadenopathy:    He has no cervical adenopathy.  Neurological: He is alert. He has normal reflexes.  Skin: Skin is warm.  No splinter hemorrhages no osler nodes no Creed Copper  lesions  Psychiatric: He has a normal mood and affect. His behavior is normal.  Nursing note and vitals reviewed.  ED Course  Procedures (including critical care time) DIAGNOSTIC STUDIES: Oxygen Saturation is 100% on RA, normal by my interpretation.   COORDINATION OF CARE: 11:17 PM-Discussed next steps with pt. Pt verbalized understanding and is agreeable with the plan.   Labs Review Labs Reviewed  BASIC METABOLIC PANEL - Abnormal; Notable for the following:    Potassium 3.1 (*)    All other components within normal limits  CBC WITH DIFFERENTIAL/PLATELET - Abnormal; Notable for the following:    WBC 12.0 (*)    Hemoglobin 10.6 (*)    HCT 33.1 (*)    MCV 71.3 (*)    MCH 22.8 (*)    RDW 16.8 (*)    Platelets 441 (*)    Neutro Abs 9.3 (*)    All other components within normal limits  TROPONIN I    Imaging Review Dg Chest 2 View  09/17/2015  CLINICAL DATA:  Intermittent headaches for 2 weeks. Pain in the left shoulder blade. Difficulty taking deep breath. Chest pain. EXAM: CHEST  2 VIEW COMPARISON:  12/28/2012 FINDINGS: The heart size and mediastinal contours are within normal limits. Both lungs are clear. The visualized skeletal  structures are unremarkable. IMPRESSION: No active cardiopulmonary disease. Electronically Signed   By: Burman Nieves M.D.   On: 09/17/2015 23:06   I have personally reviewed and evaluated these images and lab results as part of my medical decision-making.   EKG Interpretation   Date/Time:  Wednesday September 17 2015 21:41:43 EDT Ventricular Rate:  83 PR Interval:  90 QRS Duration: 86 QT Interval:  366 QTC Calculation: 430 R Axis:   41 Text Interpretation:  Sinus rhythm with short PR Confirmed by  [facility]  MD, Gionni Freese ([id number]) on 09/17/2015 11:06:27 PM      MDM   Final diagnoses:  None   Filed Vitals:   09/17/15 2142 09/18/15 0014  BP: 165/91 141/89  Pulse: 91 77  Temp: 99.1 F (37.3 C)   Resp: 22 13   Results for orders placed or performed during the hospital encounter of 09/17/15  Basic metabolic panel  Result Value Ref Range   Sodium 136 135 - 145 mmol/L   Potassium 3.1 (L) 3.5 - 5.1 mmol/L   Chloride 105 101 - 111 mmol/L   CO2 22 22 - 32 mmol/L   Glucose, Bld 90 65 - 99 mg/dL   BUN 10 6 - 20 mg/dL   Creatinine, Ser 4.78 0.61 - 1.24 mg/dL   Calcium 9.4 8.9 - 29.5 mg/dL   GFR calc non Af Amer >60 >60 mL/min   GFR calc Af Amer >60 >60 mL/min   Anion gap 9 5 - 15  CBC with Differential  Result Value Ref Range   WBC 12.0 (H) 4.0 - 10.5 K/uL   RBC 4.64 4.22 - 5.81 MIL/uL   Hemoglobin 10.6 (L) 13.0 - 17.0 g/dL   HCT 62.1 (L) 30.8 - 65.7 %   MCV 71.3 (L) 78.0 - 100.0 fL   MCH 22.8 (L) 26.0 - 34.0 pg   MCHC 32.0 30.0 - 36.0 g/dL   RDW 84.6 (H) 96.2 - 95.2 %   Platelets 441 (H) 150 - 400 K/uL   Neutrophils Relative % 77 %   Neutro Abs 9.3 (H) 1.7 - 7.7 K/uL   Lymphocytes Relative 14 %   Lymphs Abs 1.7 0.7 - 4.0 K/uL  Monocytes Relative 8 %   Monocytes Absolute 0.9 0.1 - 1.0 K/uL   Eosinophils Relative 1 %   Eosinophils Absolute 0.2 0.0 - 0.7 K/uL   Basophils Relative 0 %   Basophils Absolute 0.0 0.0 - 0.1 K/uL  Troponin I  Result Value Ref Range    Troponin I <0.03 <0.03 ng/mL   Dg Chest 2 View  09/17/2015  CLINICAL DATA:  Intermittent headaches for 2 weeks. Pain in the left shoulder blade. Difficulty taking deep breath. Chest pain. EXAM: CHEST  2 VIEW COMPARISON:  12/28/2012 FINDINGS: The heart size and mediastinal contours are within normal limits. Both lungs are clear. The visualized skeletal structures are unremarkable. IMPRESSION: No active cardiopulmonary disease. Electronically Signed   By: Burman Nieves M.D.   On: 09/17/2015 23:06   Ct Angio Chest Pe W/cm &/or Wo Cm  09/18/2015  CLINICAL DATA:  30 year old male with mid chest pain and upper back pain. EXAM: CT ANGIOGRAPHY CHEST WITH CONTRAST TECHNIQUE: Multidetector CT imaging of the chest was performed using the standard protocol during bolus administration of intravenous contrast. Multiplanar CT image reconstructions and MIPs were obtained to evaluate the vascular anatomy. CONTRAST:  100 cc Isovue 370 COMPARISON:  Chest radiograph dated 09/17/2015 FINDINGS: With the lungs are clear. There is no pleural effusion or pneumothorax. The central airways are patent. The thoracic aorta appears unremarkable. There is no evidence of dissection or aneurysm. The origins of the great vessels of the aortic arch appear patent. No CT evidence of pulmonary embolism. There is no cardiomegaly or pericardial effusion. Top-normal right hilar lymph node. No hilar or mediastinal adenopathy. The esophagus is grossly unremarkable. No thyroid nodules identified. There is no axillary or supraclavicular adenopathy. The chest wall soft tissues appear unremarkable. The osseous structures are intact. The visualized upper abdomen appears unremarkable. Review of the MIP images confirms the above findings. IMPRESSION: No acute intrathoracic pathology. No CT evidence of pulmonary embolism. Electronically Signed   By: Elgie Collard M.D.   On: 09/18/2015 00:31    Medications  potassium chloride SA (K-DUR,KLOR-CON) CR  tablet 40 mEq (not administered)  ketorolac (TORADOL) 30 MG/ML injection 30 mg (30 mg Intravenous Given 09/18/15 0019)  sodium chloride 0.9 % bolus 1,000 mL (0 mLs Intravenous Stopped 09/18/15 0053)  metoCLOPramide (REGLAN) injection 10 mg (10 mg Intravenous Given 09/18/15 0021)  diphenhydrAMINE (BENADRYL) injection 12.5 mg (12.5 mg Intravenous Given 09/18/15 0020)  iopamidol (ISOVUE-370) 76 % injection 100 mL (100 mLs Intravenous Contrast Given 09/18/15 0022)    Suspect back pain is a spasm as he has reproducible paraspinal spasm in the upper back.  No PNA. Patient actually denied chest pain to EDP. Ruled out for PE and MI in the ED.  Labs are similar to baseline.  Painfree in the ED. Post medication.   Will start doxycycline to treat for bronchitis as is a smoker and a muscle relaxant for spasm and have patient follow up with PMD and GI this week for recheck.  He has been informed that he must inform his doctors of his symptoms and may need additional specialized lab work.  Strict return precautions given.    I personally performed the services described in this documentation, which was scribed in my presence. The recorded information has been reviewed and is accurate.      Cy Blamer, MD 09/18/15 0111

## 2015-09-18 ENCOUNTER — Encounter (HOSPITAL_BASED_OUTPATIENT_CLINIC_OR_DEPARTMENT_OTHER): Payer: Self-pay | Admitting: Emergency Medicine

## 2015-09-18 MED ORDER — ALBUTEROL SULFATE HFA 108 (90 BASE) MCG/ACT IN AERS: 1.0000 | INHALATION_SPRAY | Freq: Four times a day (QID) | RESPIRATORY_TRACT | Status: DC | PRN

## 2015-09-18 MED ORDER — METHOCARBAMOL 500 MG PO TABS: 500.0000 mg | ORAL_TABLET | Freq: Two times a day (BID) | ORAL | Status: DC

## 2015-09-18 MED ORDER — POTASSIUM CHLORIDE CRYS ER 20 MEQ PO TBCR
40.0000 meq | EXTENDED_RELEASE_TABLET | Freq: Once | ORAL | Status: AC
  Administered 2015-09-18: 40 meq via ORAL
  Filled 2015-09-18: qty 2

## 2015-09-18 MED ORDER — DOXYCYCLINE HYCLATE 100 MG PO CAPS: 100.0000 mg | ORAL_CAPSULE | Freq: Two times a day (BID) | ORAL | Status: DC

## 2015-09-18 MED ORDER — IOPAMIDOL (ISOVUE-370) INJECTION 76%
100.0000 mL | Freq: Once | INTRAVENOUS | Status: AC | PRN
  Administered 2015-09-18: 100 mL via INTRAVENOUS

## 2015-09-18 NOTE — ED Notes (Signed)
Pt verbalizes understanding of d/c instructions and denies any further needs at this time. 

## 2015-09-18 NOTE — Discharge Instructions (Signed)
Heat Therapy  Heat therapy can help ease sore, stiff, injured, and tight muscles and joints. Heat relaxes your muscles, which may help ease your pain.   RISKS AND COMPLICATIONS  If you have any of the following conditions, do not use heat therapy unless your health care provider has approved:   Poor circulation.   Healing wounds or scarred skin in the area being treated.   Diabetes, heart disease, or high blood pressure.   Not being able to feel (numbness) the area being treated.   Unusual swelling of the area being treated.   Active infections.   Blood clots.   Cancer.   Inability to communicate pain. This may include young children and people who have problems with their brain function (dementia).   Pregnancy.  Heat therapy should only be used on old, pre-existing, or long-lasting (chronic) injuries. Do not use heat therapy on new injuries unless directed by your health care provider.  HOW TO USE HEAT THERAPY  There are several different kinds of heat therapy, including:   Moist heat pack.   Warm water bath.   Hot water bottle.   Electric heating pad.   Heated gel pack.   Heated wrap.   Electric heating pad.  Use the heat therapy method suggested by your health care provider. Follow your health care provider's instructions on when and how to use heat therapy.  GENERAL HEAT THERAPY RECOMMENDATIONS   Do not sleep while using heat therapy. Only use heat therapy while you are awake.   Your skin may turn pink while using heat therapy. Do not use heat therapy if your skin turns red.   Do not use heat therapy if you have new pain.   High heat or long exposure to heat can cause burns. Be careful when using heat therapy to avoid burning your skin.   Do not use heat therapy on areas of your skin that are already irritated, such as with a rash or sunburn.  SEEK MEDICAL CARE IF:   You have blisters, redness, swelling, or numbness.   You have new pain.   Your pain is worse.  MAKE SURE  YOU:   Understand these instructions.   Will watch your condition.   Will get help right away if you are not doing well or get worse.     This information is not intended to replace advice given to you by your health care provider. Make sure you discuss any questions you have with your health care provider.     Document Released: 05/17/2011 Document Revised: 03/15/2014 Document Reviewed: 04/17/2013  [employer] Interactive Patient Education 2016 [employer]

## 2015-12-08 MED FILL — AMLODIPINE BESYLATE 10 MG T: 10 | 30 days supply | Qty: 30 | Fill #0

## 2015-12-08 MED FILL — PANTOPRAZOLE SOD DR 40 MG T: 40 | 30 days supply | Qty: 30 | Fill #0

## 2015-12-08 MED FILL — FERROUS SULFATE 325 MG TAB: 325 (65 FE) | 30 days supply | Qty: 60 | Fill #0

## 2015-12-08 MED FILL — sulfaSALAzine 500 MG TABS: 500 | 30 days supply | Qty: 240 | Fill #0

## 2015-12-08 MED FILL — MIRTAZAPINE 15 MG TABLET: 15 | 30 days supply | Qty: 30 | Fill #0

## 2016-01-26 MED FILL — AMLODIPINE BESYLATE 10 MG T: 10 | 30 days supply | Qty: 30 | Fill #1

## 2016-01-26 MED FILL — ?PANTOPRAZOLE SOD DR 40MG: 40 MG | 30 days supply | Qty: 30 | Fill #1

## 2016-03-12 MED FILL — ?AMLODIPINE BESYLATE 10 MG: 10 | 30 days supply | Qty: 30 | Fill #2

## 2016-03-12 MED FILL — sulfaSALAzine 500 MG TABS: 500 | 30 days supply | Qty: 240 | Fill #1

## 2016-03-12 MED FILL — MIRTAZAPINE 15 MG TABLET: 15 | 30 days supply | Qty: 30 | Fill #1

## 2016-03-12 MED FILL — FERROUS SULFATE 325 MG TAB: 325 (65 FE) | 30 days supply | Qty: 60 | Fill #1

## 2016-03-12 MED FILL — ?PANTOPRAZOLE SOD DR 40MG: 40 MG | 30 days supply | Qty: 30 | Fill #2

## 2016-04-30 MED FILL — MIRTAZAPINE 15 MG TABLET: 15 | 30 days supply | Qty: 30 | Fill #2

## 2016-04-30 MED FILL — ?PANTOPRAZOLE SOD DR 40MG: 40 MG | 30 days supply | Qty: 30 | Fill #3

## 2016-04-30 MED FILL — AMLODIPINE BESYLATE 10 MG T: 10 | 30 days supply | Qty: 30 | Fill #3

## 2016-06-07 MED FILL — sulfaSALAzine 500 MG TABS: 500 | 30 days supply | Qty: 240 | Fill #2

## 2016-06-07 MED FILL — ?PANTOPRAZOLE SOD DR 40MG: 40 MG | 30 days supply | Qty: 30 | Fill #4

## 2016-06-07 MED FILL — FERROUS SULFATE 325 MG TAB: 325 (65 FE) | 30 days supply | Qty: 60 | Fill #2

## 2016-06-07 MED FILL — AMLODIPINE BESYLATE 10 MG T: 10 | 30 days supply | Qty: 30 | Fill #4

## 2016-07-22 MED FILL — PANTOPRAZOLE SOD DR 40 MG T: 40 | 30 days supply | Qty: 30 | Fill #5

## 2016-07-22 MED FILL — FERROUS SULFATE 325 MG TAB: 325 (65 FE) | 30 days supply | Qty: 60 | Fill #3

## 2016-07-22 MED FILL — ?AMLODIPINE BESYLATE 10 MG: 10 | 30 days supply | Qty: 30 | Fill #5

## 2016-08-30 MED FILL — AMLODIPINE BESYLATE 10 MG T: 10 | 30 days supply | Qty: 30 | Fill #6

## 2016-08-30 MED FILL — ?PANTOPRAZOLE SOD DR 40MG: 40 MG | 30 days supply | Qty: 30 | Fill #6

## 2016-10-11 MED FILL — ?PANTOPRAZOLE SOD DR 40MG: 40 MG | 30 days supply | Qty: 30 | Fill #7

## 2016-10-11 MED FILL — sulfaSALAzine 500 MG TABS: 500 | 30 days supply | Qty: 240 | Fill #3

## 2016-10-11 MED FILL — AMLODIPINE BESYLATE 10 MG T: 10 | 30 days supply | Qty: 30 | Fill #7

## 2016-11-09 MED FILL — AMLODIPINE BESYLATE 10 MG T: 10 | 30 days supply | Qty: 30 | Fill #8

## 2016-11-09 MED FILL — ?PANTOPRAZOLE SOD DR 40MG: 40 MG | 30 days supply | Qty: 30 | Fill #8

## 2016-12-20 MED FILL — AMLODIPINE BESYLATE 10 MG T: 10 | 30 days supply | Qty: 30 | Fill #0

## 2016-12-20 MED FILL — sulfaSALAzine 500 MG TABS: 500 | 30 days supply | Qty: 240 | Fill #0

## 2016-12-20 MED FILL — ?PANTOPRAZOLE SOD DR 40MG: 40 MG | 30 days supply | Qty: 30 | Fill #0

## 2016-12-21 MED FILL — FERROUS SULFATE 325 MG TAB: 325 (65 FE) | 30 days supply | Qty: 60 | Fill #0

## 2017-01-19 MED FILL — ?PANTOPRAZOLE SOD DR 40MG: 40 MG | 30 days supply | Qty: 30 | Fill #1

## 2017-01-19 MED FILL — AMLODIPINE BESYLATE 10 MG T: 10 | 30 days supply | Qty: 30 | Fill #1

## 2017-02-23 MED FILL — AMLODIPINE BESYLATE 10 MG T: 10 | 30 days supply | Qty: 30 | Fill #2

## 2017-02-23 MED FILL — sulfaSALAzine 500 MG TABS: 500 | 30 days supply | Qty: 240 | Fill #1

## 2017-02-23 MED FILL — ?PANTOPRAZOLE SOD DR 40MG: 40 MG | 30 days supply | Qty: 30 | Fill #2

## 2017-03-30 MED FILL — AMLODIPINE BESYLATE 10 MG T: 10 | 30 days supply | Qty: 30 | Fill #3

## 2017-03-30 MED FILL — ?PANTOPRAZOLE SOD DR 40MG: 40 MG | 30 days supply | Qty: 30 | Fill #3

## 2017-05-02 MED FILL — ?PANTOPRAZOLE SOD DR 40MG T: 40 | 30 days supply | Qty: 30 | Fill #4

## 2017-05-02 MED FILL — AMLODIPINE BESYLATE 10 MG T: 10 | 30 days supply | Qty: 30 | Fill #4

## 2017-06-13 MED FILL — sulfaSALAzine 500 MG TABS: 500 | 30 days supply | Qty: 240 | Fill #2

## 2017-06-13 MED FILL — AMLODIPINE BESYLATE 10 MG T: 10 | 30 days supply | Qty: 30 | Fill #5

## 2017-06-13 MED FILL — FERROUS SULFATE 325 MG TAB: 325 (65 FE) | 30 days supply | Qty: 60 | Fill #1

## 2017-06-13 MED FILL — PANTOPRAZOLE SOD DR 40 MG T: 40 | 30 days supply | Qty: 30 | Fill #5

## 2017-08-18 MED FILL — PANTOPRAZOLE SOD DR 40 MG T: 40 | 30 days supply | Qty: 30 | Fill #6

## 2017-08-18 MED FILL — AMLODIPINE BESYLATE 10 MG T: 10 | 30 days supply | Qty: 30 | Fill #6

## 2017-10-05 MED FILL — AMLODIPINE BESYLATE 10 MG T: 10 | 30 days supply | Qty: 30 | Fill #7

## 2017-10-05 MED FILL — PANTOPRAZOLE SOD DR 40 MG T: 40 | 30 days supply | Qty: 30 | Fill #7

## 2017-11-14 MED FILL — PANTOPRAZOLE SOD DR 40 MG T: 40 | 30 days supply | Qty: 30 | Fill #8

## 2017-11-14 MED FILL — AMLODIPINE BESYLATE 10 MG T: 10 | 30 days supply | Qty: 30 | Fill #8

## 2017-11-14 MED FILL — sulfaSALAzine 500 MG TABS: 500 | 30 days supply | Qty: 240 | Fill #3

## 2017-11-30 MED FILL — cloNIDine HCL 0.1 MG TABS: 0.1 | 30 days supply | Qty: 30 | Fill #0

## 2017-11-30 MED FILL — FLUoxetine HCL 20 MG CAPS: 20 | 30 days supply | Qty: 30 | Fill #0

## 2018-08-13 LAB — CBC WITH AUTO DIFFERENTIAL
Absolute Baso #: 0.1 10*3/uL (ref 0.0–0.2)
Absolute Eos #: 0.2 10*3/uL (ref 0.0–0.5)
Absolute Lymph #: 1.4 10*3/uL (ref 1.0–3.2)
Absolute Mono #: 1.1 10*3/uL — ABNORMAL HIGH (ref 0.3–1.0)
Basophils %: 0.6 % (ref 0.0–2.0)
Eosinophils %: 1 % (ref 0.0–7.0)
Hematocrit: 26.7 % — ABNORMAL LOW (ref 38.0–52.0)
Hemoglobin: 7.1 g/dL — ABNORMAL LOW (ref 12.0–17.3)
Immature Grans (Abs): 0.1 10*3/uL
Immature Granulocytes: 0.5 %
Lymphocytes: 9.5 % — ABNORMAL LOW (ref 15.0–45.0)
MCH: 15.1 pg — ABNORMAL LOW (ref 27.0–34.5)
MCHC: 26.6 g/dL — ABNORMAL LOW (ref 32.0–36.0)
MCV: 56.7 fL — ABNORMAL LOW (ref 84.0–100.0)
MPV: 10.2 fL (ref 7.2–13.2)
Monocytes: 7.7 % (ref 4.0–12.0)
NRBC Absolute: 0 10*3/uL
NRBC Automated: 0 %
Neutrophils %: 80.7 % — ABNORMAL HIGH (ref 42.0–74.0)
Neutrophils Absolute: 11.9 10*3/uL — ABNORMAL HIGH (ref 1.6–7.3)
Platelets: 974 10*3/uL — ABNORMAL HIGH (ref 140–440)
RBC: 4.71 x10e6/mcL (ref 4.00–5.20)
RDW: 20.5 % — ABNORMAL HIGH (ref 11.0–16.0)
WBC: 14.8 10*3/uL — ABNORMAL HIGH (ref 3.8–10.6)

## 2018-08-13 LAB — BASIC METABOLIC PANEL
Anion Gap: 17 mmol/L (ref 2–17)
BUN: 7 mg/dL (ref 6–20)
CO2: 22 mmol/L (ref 22–29)
Calcium: 9.3 mg/dL (ref 8.6–10.0)
Chloride: 95 mmol/L — ABNORMAL LOW (ref 98–107)
Creatinine: 0.8 mg/dL (ref 0.7–1.3)
GFR African American: 136 mL/min/{1.73_m2} (ref >=90)
GFR Non-African American: 117 mL/min/{1.73_m2} (ref >=90)
Glucose: 112 mg/dL — ABNORMAL HIGH (ref 70–99)
OSMOLALITY CALCULATED: 267 mOsm/kg — ABNORMAL LOW (ref 270–287)
Potassium: 3.5 mmol/L (ref 3.5–5.3)
Sodium: 134 mmol/L — ABNORMAL LOW (ref 135–145)

## 2018-08-13 LAB — MORPHOLOGY CHECK
Platelet Estimate: INCREASED — AB
RBC Morphology: ABNORMAL — AB

## 2018-08-13 LAB — IMMATURE CELLS
IMMATURE PLT ABSOLUTE: 41.9 10*3/uL
IMMATURE PLT PERCENT: 4.3 %

## 2018-08-13 LAB — HEPATIC FUNCTION PANEL
ALT: 5 U/L (ref 0–41)
AST: 12 U/L (ref 0–40)
Albumin: 3.8 g/dL (ref 3.5–5.2)
Alk Phosphatase: 37 U/L — ABNORMAL LOW (ref 40–130)
Bilirubin, Direct: <0.2 mg/dL (ref 0.00–0.30)
Total Bilirubin: 0.4 mg/dL (ref 0.00–1.20)
Total Protein: 8.6 g/dL — ABNORMAL HIGH (ref 6.4–8.3)

## 2018-08-13 LAB — LIPASE: Lipase: 17 U/L (ref 13–60)

## 2018-08-13 NOTE — ED Notes (Signed)
 ED Patient Summary       ;       [facility]  [address], [place], [place] [address]  [phone]  Discharge Instructions (Patient)  _______________________________________     Name: Joe [name], Joe [name]  DOB:  08/24/1985                   MRN: [id number]                   FIN: [id number]  Reason For Visit: Abdominal pain; CROHNS SYMPTS  Final Diagnosis: Crohn's disease, acute     Visit Date: 08/13/2018 06:05:00  Address: [address] [place] [address]  Phone: [phone]     Primary Care Provider:      Name: PCP,  NONE      Phone:         Emergency Department Providers:         Primary Physician:   MARINE ELSIE ONEIDA Florie Lionel would like to thank you for allowing us  to assist you with your healthcare needs. The following includes patient education materials and information regarding your injury/illness.     Follow-up Instructions: You were treated today on an emergency basis, it may be wise to contact your primary care provider to notify them of your visit today. You may have been referred to your regular doctor or a specialist, please follow up as instructed. If your condition worsens or you can't get in to see the doctor, contact the Emergency Department.              With: Address: When:   [facility]  Within 1 week   Comments:   Please call [phone] for an appointment. Return for any new or worsening symptoms.              Printed Prescriptions:    Patient Education Materials:  Discharge Orders          Discharge Patient 08/13/18 7:50:00 EDT         Comment:      Crohn Disease     Crohn Disease    Crohn disease is a long-lasting (chronic) disease that affects your gastrointestinal (GI) tract. It often causes irritation and swelling (inflammation) in your small intestine and the beginning of your large intestine. However, it can affect any part of your GI tract. Crohn disease is part of a group of illnesses that are known as inflammatory bowel disease  (IBD).    Crohn disease may start slowly and get worse over time. Symptoms may come and go. They may also disappear for months or even years at a time (remission).    CAUSES    The exact cause of Crohn disease is not known. It may be a response that causes your body's defense system (immune system) to mistakenly attack healthy cells and tissues (autoimmune response). Your genes and your environment may also play a role.    RISK FACTORS    You may be at greater risk for Crohn disease if you:     Have other family members with Crohn disease or another IBD.     Use any tobacco products, including cigarettes, chewing tobacco, or electronic cigarettes.     Are in your 9s.     Have [place] European ancestry.    SIGNS AND SYMPTOMS    The main signs and symptoms of Crohn disease involve your GI tract. These include:  Diarrhea.     Rectal bleeding.     An urgent need to move your bowels.     The feeling that you are not finished having a bowel movement.     Abdominal pain or cramping.     Constipation.    General signs and symptoms of Crohn disease may also include:     Unexplained weight loss.     Fatigue.      Fever.     Nausea.     Loss of appetite.     Joint pain     Changes in vision.     Red bumps on your skin.    DIAGNOSIS    Your health care provider may suspect Crohn disease based on your symptoms and your medical history. Your health care provider will do a physical exam. You may need to see a health care provider who specializes in diseases of the digestive tract (gastroenterologist). You may also have tests to help your health care providers make a diagnosis. These may include:     Blood tests.     Stool sample tests.     Imaging tests, such as X-rays and CT scans.     Tests to examine the inside of your intestines using a long, flexible tube that has a light and a camera on the end (endoscopy or colonoscopy).     A procedure to take tissue samples from inside your bowel (biopsy) to be examined under a  microscope.    TREATMENT    There is no cure for Crohn disease. Treatment will focus on managing your symptoms. Crohn disease affects each person differently. Your treatment may include:     Resting your bowels. Drinking only clear liquids or getting nutrition through an IV for a period of time gives your bowels a chance to heal because they are not passing stools.     Medicines. These may be used alone or in combination (combination therapy). These may include antibiotic medicines. You may be given medicines that help to:    ? Reduce inflammation.    ? Control your immune system activity.    ? Fight infections.    ? Relieve cramps and prevent diarrhea.    ? Control your pain.     Surgery. You may need surgery if:    ? Medicines and other treatments are no longer working.    ? You develop complications from severe Crohn disease.    ? A section of your intestine becomes so damaged that it needs to be removed.    HOME CARE INSTRUCTIONS     Take medicines only as directed by your health care provider.     If you were prescribed an antibiotic medicine, finish it all even if you start to feel better.     Keep all follow-up visits as directed by your health care provider. This is important.     Talk with your health care provider about changing your diet. This may help your symptoms. Your health care provide may recommend changes, such as:    ? Drinking more fluids.    ? Avoiding milk and other foods that contain lactose.    ? Eating a low-fat diet.    ? Avoiding high-fiber foods, such as popcorn and nuts.    ? Avoiding carbonated beverages, such as soda.    ? Eating smaller meals more often rather than eating large meals.    ? Keeping a food diary to identify foods that make  your symptoms better or worse.     Do not use any tobacco products, including cigarettes, chewing tobacco, or electronic cigarettes. If you need help quitting, ask your health care provider.     Limit alcohol intake to no more than 1 drink per day  for nonpregnant women and 2 drinks per day for men. One drink equals 12 ounces of beer, 5 ounces of wine, or 1? ounces of hard liquor.     Exercise daily or as directed by your health care provider.    SEEK MEDICAL CARE IF:     You have diarrhea, abdominal cramps, and other gastrointestinal problems that are present almost all of the time.     Your symptoms do not improve with treatment.     You continue to lose weight.     You develop a rash or sores on your skin.     You develop eye problems.     You have a fever. ?     Your symptoms get worse.     You develop new symptoms.    SEEK IMMEDIATE MEDICAL CARE IF:     You have bloody diarrhea.     You develop severe abdominal pain.     You cannot pass stools.    This information is not intended to replace advice given to you by your health care provider. Make sure you discuss any questions you have with your health care provider.    Document Released: 12/02/2004 Document Revised: 03/15/2014 Document Reviewed: 10/10/2013  Elsevier Interactive Patient Education ?2016 Elsevier Inc.         Allergy Info: Remicade     Medication Information:  [facility] ED Physicians provided you with a complete list of medications post discharge, if you have been instructed to stop taking a medication please ensure you also follow up with this information to your Primary Care Physician.  Unless otherwise noted, patient will continue to take medications as prescribed prior to the Emergency Room visit.  Any specific questions regarding your chronic medications and dosages should be discussed with your physician(s) and pharmacist.          adalimumab (Humira Pre-Filled Syringe 40 mg/0.4 mL subcutaneous kit) Subcutaneous (under the skin).  FLUoxetine (PROzac 10 mg oral capsule) 1 Capsules Oral (given by mouth) every day.  Misc Rx Supply (Blood pressure medication)  predniSONE (predniSONE 20 mg oral tablet) 2 Tabs Oral (given by mouth) every day for 5 Days. Refills: 0.  sulfaSALAzine  (sulfaSALAzine 500 mg oral tablet) 2 Tabs Oral (given by mouth) 2 times a day.      Medications Administered During Visit:              Medication Dose Route   Sodium Chloride 0.9% 2000 mL IV Piggyback   morphine 4 mg IV Push   ondansetron 4 mg IV Push   iopamidol 100 mL IV Contrast   predniSONE 60 mg Oral          Major Tests and Procedures:  The following procedures and tests were performed during your ED visit.  COMMON PROCEDURES%>  COMMON PROCEDURES COMMENTS%>          Laboratory Orders  Name Status Details   .IPF Completed Blood, Stat, ST - Stat, 08/13/18 6:15:00 EDT, 08/13/18 6:15:00 EDT, Nurse collect, 08/13/18 6:21:00 US Robinette MARINE ELSIE ONEIDA, [id number]   BMP Completed Blood, Stat, ST - Stat, 08/13/18 6:15:00 EDT, 08/13/18 6:15:00 EDT, Nurse collect, RIVERS-MD,  ELSIE ONEIDA, Print label Y/N  CBCDIFF Completed Blood, Stat, ST - Stat, 08/13/18 6:15:00 EDT, 08/13/18 6:15:00 EDT, Nurse collect, RIVERS-MD,  ELSIE T, Print label Y/N   Hepatic Completed Blood, Stat, ST - Stat, 08/13/18 6:15:00 EDT, 08/13/18 6:15:00 EDT, Nurse collect, RIVERS-MD,  ELSIE T, Print label Y/N   Lipase Lvl Completed Blood, Stat, ST - Stat, 08/13/18 6:15:00 EDT, 08/13/18 6:15:00 EDT, Nurse collect, RIVERS-MD,  ELSIE DASEN, Print label Y/N   Morphology Review Completed Blood, Stat, ST - Stat, 08/13/18 6:15:00 EDT, 08/13/18 6:15:00 EDT, Nurse collect, 08/13/18 6:21:00 US Robinette MARINE ELSIE DASEN, [id number]               Radiology Orders  Name Status Details   CT Abdomen Pelvis w/ Contrast Completed 08/13/18 6:15:00 EDT, STAT 1 hour or less, Reason: Abdominal pain, unspecified, Reason: hx Crohn's, pain in LLQ, Transport Mode: STRETCHER, [email or web address]               Patient Care Orders  Name Status Details   Discharge Patient Ordered 08/13/18 7:50:00 EDT   ED Assessment Adult Completed 08/13/18 6:20:37 EDT, 08/13/18 6:20:37 EDT   ED Secondary Triage Completed 08/13/18 6:20:37 EDT, 08/13/18 6:20:37  EDT   ED Triage Adult Completed 08/13/18 6:05:13 EDT, 08/13/18 6:05:13 EDT   Saline Lock Insert Completed 08/13/18 6:15:00 EDT, Once, 08/13/18 6:15:00 EDT       ---------------------------------------------------------------------------------------------------------------------  [facility] allows you to manage your health, view your test results, and retrieve your discharge documents from your hospital stay securely and conveniently from your computer.     To begin the enrollment process, visit [email or web address] Click on "Sign up now" under [facility].   Comment:

## 2018-08-13 NOTE — ED Notes (Signed)
ED Patient Education Note     Patient Education Materials Follows:  Immunology     Crohn Disease    Crohn disease is a long-lasting (chronic) disease that affects your gastrointestinal (GI) tract. It often causes irritation and swelling (inflammation) in your small intestine and the beginning of your large intestine. However, it can affect any part of your GI tract. Crohn disease is part of a group of illnesses that are known as inflammatory bowel disease (IBD).    Crohn disease may start slowly and get worse over time. Symptoms may come and go. They may also disappear for months or even years at a time (remission).    CAUSES    The exact cause of Crohn disease is not known. It may be a response that causes your body's defense system (immune system) to mistakenly attack healthy cells and tissues (autoimmune response). Your genes and your environment may also play a role.    RISK FACTORS    You may be at greater risk for Crohn disease if you:     Have other family members with Crohn disease or another IBD.     Use any tobacco products, including cigarettes, chewing tobacco, or electronic cigarettes.     Are in your 20s.     Have [place] ancestry.    SIGNS AND SYMPTOMS    The main signs and symptoms of Crohn disease involve your GI tract. These include:     Diarrhea.     Rectal bleeding.     An urgent need to move your bowels.     The feeling that you are not finished having a bowel movement.     Abdominal pain or cramping.     Constipation.    General signs and symptoms of Crohn disease may also include:     Unexplained weight loss.     Fatigue.      Fever.     Nausea.     Loss of appetite.     Joint pain     Changes in vision.     Red bumps on your skin.    DIAGNOSIS    Your health care provider may suspect Crohn disease based on your symptoms and your medical history. Your health care provider will do a physical exam. You may need to see a health care provider who specializes in diseases of the digestive  tract (gastroenterologist). You may also have tests to help your health care providers make a diagnosis. These may include:     Blood tests.     Stool sample tests.     Imaging tests, such as X-rays and CT scans.     Tests to examine the inside of your intestines using a long, flexible tube that has a light and a camera on the end (endoscopy or colonoscopy).     A procedure to take tissue samples from inside your bowel (biopsy) to be examined under a microscope.    TREATMENT    There is no cure for Crohn disease. Treatment will focus on managing your symptoms. Crohn disease affects each person differently. Your treatment may include:     Resting your bowels. Drinking only clear liquids or getting nutrition through an IV for a period of time gives your bowels a chance to heal because they are not passing stools.     Medicines. These may be used alone or in combination (combination therapy). These may include antibiotic medicines. You may be given medicines that help to:    ?   Reduce inflammation.    ? Control your immune system activity.    ? Fight infections.    ? Relieve cramps and prevent diarrhea.    ? Control your pain.     Surgery. You may need surgery if:    ? Medicines and other treatments are no longer working.    ? You develop complications from severe Crohn disease.    ? A section of your intestine becomes so damaged that it needs to be removed.    HOME CARE INSTRUCTIONS     Take medicines only as directed by your health care provider.     If you were prescribed an antibiotic medicine, finish it all even if you start to feel better.     Keep all follow-up visits as directed by your health care provider. This is important.     Talk with your health care provider about changing your diet. This may help your symptoms. Your health care provide may recommend changes, such as:    ? Drinking more fluids.    ? Avoiding milk and other foods that contain lactose.    ? Eating a low-fat diet.    ? Avoiding high-fiber  foods, such as popcorn and nuts.    ? Avoiding carbonated beverages, such as soda.    ? Eating smaller meals more often rather than eating large meals.    ? Keeping a food diary to identify foods that make your symptoms better or worse.     Do not use any tobacco products, including cigarettes, chewing tobacco, or electronic cigarettes. If you need help quitting, ask your health care provider.     Limit alcohol intake to no more than 1 drink per day for nonpregnant women and 2 drinks per day for men. One drink equals 12 ounces of beer, 5 ounces of wine, or 1? ounces of hard liquor.     Exercise daily or as directed by your health care provider.    SEEK MEDICAL CARE IF:     You have diarrhea, abdominal cramps, and other gastrointestinal problems that are present almost all of the time.     Your symptoms do not improve with treatment.     You continue to lose weight.     You develop a rash or sores on your skin.     You develop eye problems.     You have a fever. ?     Your symptoms get worse.     You develop new symptoms.    SEEK IMMEDIATE MEDICAL CARE IF:     You have bloody diarrhea.     You develop severe abdominal pain.     You cannot pass stools.    This information is not intended to replace advice given to you by your health care provider. Make sure you discuss any questions you have with your health care provider.    Document Released: 12/02/2004 Document Revised: 03/15/2014 Document Reviewed: 10/10/2013  Elsevier Interactive Patient Education ?2016 Elsevier Inc.

## 2018-08-13 NOTE — ED Notes (Signed)
ED Triage Note       ED Secondary Triage Entered On:  08/13/2018 6:22 EDT    Performed On:  08/13/2018 6:21 EDT by Delight Stare, RN, [place] N               General Information   Barriers to Learning :   None evident   ED Home Meds Section :   Document assessment   [place] ED Fall Risk Section :   Document assessment   ED Advance Directives Section :   Document assessment   ED Palliative Screen :   N/A (prefilled for <33yo)   FABEL, RN, HEATHER N - 08/13/2018 6:21 EDT   (As Of: 08/13/2018 06:22:16 EDT)   Problems(Active)    Crohn's disease (SNOMED CT  :37858850 )  Name of Problem:   Crohn's disease ; Recorder:   FABEL, RN, HEATHER N; Confirmation:   Confirmed ; Classification:   Patient Stated ; Code:   [id number] ; Contributor System:   PowerChart ; Last Updated:   08/13/2018 6:16 EDT ; Life Cycle Date:   08/13/2018 ; Life Cycle Status:   Active ; Vocabulary:   SNOMED CT        Depression (SNOMED CT  :[id number] )  Name of Problem:   Depression ; Recorder:   FABEL, RN, [place]; Confirmation:   Confirmed ; Classification:   Patient Stated ; Code:   [id number] ; Contributor System:   Conservation officer, nature ; Last Updated:   08/13/2018 6:22 EDT ; Life Cycle Date:   08/13/2018 ; Life Cycle Status:   Active ; Vocabulary:   SNOMED CT        Hypertension (SNOMED CT  :6629476546 )  Name of Problem:   Hypertension ; Recorder:   FABEL, RN, HEATHER N; Confirmation:   Confirmed ; Classification:   Patient Stated ; Code:   [id number] ; Contributor System:   Conservation officer, nature ; Last Updated:   08/13/2018 6:21 EDT ; Life Cycle Date:   08/13/2018 ; Life Cycle Status:   Active ; Vocabulary:   SNOMED CT          Diagnoses(Active)    Abdominal pain  Date:   08/13/2018 ; Diagnosis Type:   Reason For Visit ; Confirmation:   Complaint of ; Clinical Dx:   Abdominal pain ; Classification:   Medical ; Clinical Service:   Emergency medicine ; Code:   PNED ; Probability:   0 ; Diagnosis Code:   [id number]             -    Procedure History   (As Of: 08/13/2018  06:22:16 EDT)     Lennette Bihari Fall Risk Assessment Tool   Hx of falling last 3 months ED Fall :   No   Patient confused or disoriented ED Fall :   No   Patient intoxicated or sedated ED Fall :   No   Patient impaired gait ED Fall :   No   Use a mobility assistance device ED Fall :   No   Patient altered elimination ED Fall :   No   [facility] ED Fall Score :   0    FABEL, RN, HEATHER N - 08/13/2018 6:21 EDT   ED Advance Directive   Advance Directive :   No   FABEL, RN, HEATHER N - 08/13/2018 6:21 EDT

## 2018-08-13 NOTE — ED Provider Notes (Signed)
Abdominal Pain *ED        Patient:   Joe [name], Joe [name]             MRN: [id number]            FIN: [id number]               Age:   33 years     Sex:  Male     DOB:  Jun 13, 1985   Associated Diagnoses:   Crohn's disease, acute   Author:   Paulene Floor      Basic Information   Time seen: Provider Seen (ST)   ED Provider/Time:    Virgilio Belling T / 08/13/2018 06:09  .   Additional information: Chief Complaint from Nursing Triage Note   Chief Complaint  Chief Complaint: Pt reports he has Crohns, recently moved from [place], has been taking humira. Reports weight loss, blood in stool, abdominal pain and unable to sleep due to pain.  Nausea, vomiting, sweats at home. (08/13/18 06:11:00).      History of Present Illness   The patient presents with 33 year old male with history of Crohn's disease on Humira.  Patient recently moved to [facility].  Does not have a doctor here.  States he has had chronic abdominal pain pretty much his whole life.  States he has had severe abdominal pain going on about 2 months now.  Associated with nausea and vomiting.  He has had loose, bloody stools.  No fevers to note of.  He states while the pain is pretty diffuse, is mostly in the left lower quadrant.  No chest pain or shortness of breath.  No back pain.  No urinary symptoms..        Review of Systems   Constitutional symptoms:  No fever, no chills, no generalized weakness, no fatigue.    Skin symptoms:  No rash, no lesion.    Eye symptoms:  Vision unchanged, No blurred vision,    ENMT symptoms:  No sore throat, no sinus pain.    Respiratory symptoms:  No shortness of breath, no cough.    Cardiovascular symptoms:  No chest pain, no palpitations, no syncope, no diaphoresis, no peripheral edema.    Gastrointestinal symptoms:  Abdominal pain, nausea, vomiting, diarrhea, rectal bleeding, No constipation,    Genitourinary symptoms:  No dysuria, no hematuria.    Musculoskeletal symptoms:  No back pain, no Muscle pain.    Neurologic symptoms:   No headache, no dizziness.    Allergy/immunologic symptoms:  No recurrent infections, no impaired immunity.       Health Status   Allergies:    Allergic Reactions (Selected)  Unknown  Remicade- Itching..   Medications:  (Selected)   Inpatient Medications  Ordered  Isovue-300: 100 mL, IV Contrast, Once  Documented Medications  Documented  Blood pressure medication: 0 Refill(s)  Humira Pre-Filled Syringe 40 mg/0.4 mL subcutaneous kit: Subcutaneous, 0 Refill(s)  PROzac 10 mg oral capsule: 10 mg, 1 caps, Oral, Daily, 30 caps, 0 Refill(s)  sulfaSALAzine 500 mg oral tablet: 1,000 mg, 2 tabs, Oral, BID, 360 tabs, 0 Refill(s).      Past Medical/ Family/ Social History   Surgical history: Reviewed as documented in chart.   Family history: Reviewed as documented in chart.   Social history: Reviewed as documented in chart.   Problem list:    Active Problems (3)  Crohn's disease   Depression   Hypertension   .      Physical Examination  Vital Signs   Vital Signs   07/10/7320 0:25 EDT Systolic Blood Pressure 427 mmHg    Diastolic Blood Pressure 94 mmHg  HI    Temperature Oral 37.0 degC    Heart Rate Monitored 109 bpm  HI    Respiratory Rate 24 br/min  HI    SpO2 100 %    Measurements   08/13/2018 6:20 EDT Body Mass Index est meas 24.93 kg/m2    Body Mass Index Measured 24.93 kg/m2   08/13/2018 6:11 EDT Height/Length Measured 190 cm    Weight Dosing 90 kg    General:  Anxious appearing male, nontoxic appearing, mild distress.   Skin:  Warm, dry, intact.    Head:  Normocephalic, atraumatic.    Eye:  Extraocular movements are intact, normal conjunctiva.    Ears, nose, mouth and throat:  Oral mucosa moist.   Cardiovascular:  Tachycardic, good peripheral perfusion, no edema.   Respiratory:  Respirations are non-labored, Symmetrical chest wall expansion, Speaks in full sentences.    Gastrointestinal:  Abdominal exam technically difficult.  Patient is thin, but voluntarily guarding throughout the whole exam..   Neurological:   Alert and oriented to person, place, time, and situation, No focal neurological deficit observed.    Psychiatric:  Cooperative, appropriate mood & affect.       Medical Decision Making   Rationale:  33 year old male with history of Crohn's disease.  Patient never been seen in this hospital system.  Recently moved to [facility].  He is tachycardic, appears somewhat dehydrated.  We will check some basic labs on him give him several liters of IV fluids and check CT scan here.Marland Kitchen   Results review:  Lab results : Lab View   08/13/2018 7:05 EDT Estimated Creatinine Clearance 156.13 mL/min   08/13/2018 6:21 EDT WBC 14.8 x10e3/mcL  HI    RBC 4.71 x10e6/mcL    Hgb 7.1 g/dL  LOW    HCT 26.7 %  LOW    MCV 56.7 fL  LOW    MCH 15.1 pg  LOW    MCHC 26.6 g/dL  LOW    RDW 20.5 %  HI    Platelet 974 x10e3/mcL  HI    Immature Plt Absolute 41.9 x10e3/mcL  NA    Immature Plt Percent 4.3 %  NA    MPV 10.2 fL    Neutro Auto 80.7 %  HI    Neutro Absolute 11.9 x10e3/mcL  HI    Immature Grans Percent 0.5 %  NA    Immature Grans Absolute 0.1 x10e3/mcL  NA    Lymph Auto 9.5 %  LOW    Lymph Absolute 1.4 x10e3/mcL    Mono Auto 7.7 %    Mono Absolute 1.1 x10e3/mcL  HI    Eosinophil Percent 1.0 %    Eos Absolute 0.2 x10e3/mcL    Basophil Auto 0.6 %    Baso Absolute 0.1 x10e3/mcL    NRBC Absolute Auto 0.00 x10e3/mcL  NA    NRBC Percent Auto 0.0 %  NA    Anisocytosis Moderate    Elliptocytes Few    Hypochromia Few    PLT estimate Increased    Poikilocytosis Few    Polychromasia Few    RBC morphology Abnormal    Sodium Lvl 134 mmol/L  LOW    Potassium Lvl 3.5 mmol/L    Chloride 95 mmol/L  LOW    CO2 22 mmol/L    Glucose Random 112 mg/dL  HI  BUN 7 mg/dL    Creatinine Lvl 0.8 mg/dL    AGAP 17 mmol/L    Osmolality Calc 267 mOsm/kg  LOW    Calcium Lvl 9.3 mg/dL    Protein Total 8.6 g/dL  HI    Albumin Lvl 3.8 g/dL    Alk Phos 37 unit/L  LOW    AST 12 unit/L    ALT 5 unit/L    eGFR AA 136 mL/min/1.77m???    eGFR Non-AA 117 mL/min/1.750m??    Bili Total  0.40 mg/dL    Bili Direct <0.20 mg/dL    Lipase Lvl 17 unit/L    Radiology results:  Computed tomography, interpretation:  Overnight radiology report shows evidence of Crohn's flare with no acute abscess or other findings..       Impression and Plan   Diagnosis   Crohn's disease, acute (ICD10-CM K50.90, Discharge, Medical)   Plan   Condition: Stable.    Disposition: Discharged: Time  08/13/2018 07:55:00, to home.    Prescriptions: Launch Meds List (Selected)   Prescriptions  Prescribed  predniSONE 20 mg oral tablet: 40 mg, 2 tabs, Oral, Daily, for 5 days, 10 tabs, 0 Refill(s).    Patient was given the following educational materials: Crohn Disease.    [facility] Please call [phone] an appointment.  Return for any new or worsening symptoms..    Counseled: Patient, Regarding diagnosis, Regarding diagnostic results, Regarding treatment plan, Regarding prescription, Patient indicated understanding of instructions.    Signature Line     Electronically Signed on 08/13/2018 07:56 AM EDT   ________________________________________________   RIVirgilio Belling               Modified by: RIVirgilio Belling on 08/13/2018 06:30 AM EDT      Modified by: RIVirgilio Belling on 08/13/2018 07:56 AM EDT

## 2018-08-13 NOTE — Discharge Summary (Signed)
 ED Clinical Summary                        [facility]  [address]  [place], [place] [address]  [phone]           PERSON INFORMATION  Name: Joe [name], Joe [name] Age:  33 Years DOB: [birth year]   Sex: Male Language: English PCP: PCP,  NONE   Marital Status: Single Phone: [phone] Med Service: [facility]   MRN: [id number] Acct# [id number] Arrival: 08/13/2018 06:05:00   Visit Reason: Abdominal pain; CROHNS SYMPTS Acuity: 3 LOS: 000 02:02   Address:    [address] [place] [address]   Diagnosis:    Crohn's disease, acute  Medications:          New Medications  Printed Prescriptions  predniSONE (predniSONE 20 mg oral tablet) 2 Tabs Oral (given by mouth) every day for 5 Days. Refills: 0.  Last Dose:____________________  Medications that have not changed  Other Medications  adalimumab (Humira Pre-Filled Syringe 40 mg/0.4 mL subcutaneous kit) Subcutaneous (under the skin).  Last Dose:____________________  FLUoxetine (PROzac 10 mg oral capsule) 1 Capsules Oral (given by mouth) every day.  Last Dose:____________________  Misc Rx Supply (Blood pressure medication)   Last Dose:____________________  sulfaSALAzine (sulfaSALAzine 500 mg oral tablet) 2 Tabs Oral (given by mouth) 2 times a day.  Last Dose:____________________      Medications Administered During Visit:                Medication Dose Route   Sodium Chloride 0.9% 2000 mL IV Piggyback   morphine 4 mg IV Push   ondansetron 4 mg IV Push   iopamidol 100 mL IV Contrast   predniSONE 60 mg Oral               Allergies      Remicade (Itching)      Major Tests and Procedures:  The following procedures and tests were performed during your ED visit.  COMMON PROCEDURES%>  COMMON PROCEDURES COMMENTS%>                PROVIDER INFORMATION               Provider Role Assigned Sampson AMY, RN, POWELL SAILOR ED Nurse 08/13/2018 06:09:15    Joe [name] ED Provider 08/13/2018 06:09:23        Attending Physician:  Joe [name]      Admit  Doc  RIVERS-MD,  FALLOW [name]     Consulting Doc       VITALS INFORMATION  Vital Sign Triage Latest   Temp Oral ORAL_1%> ORAL%>   Temp Temporal TEMPORAL_1%> TEMPORAL%>   Temp Intravascular INTRAVASCULAR_1%> INTRAVASCULAR%>   Temp Axillary AXILLARY_1%> AXILLARY%>   Temp Rectal RECTAL_1%> RECTAL%>   02 Sat 100 % 99 %   Respiratory Rate RATE_1%> RATE%>   Peripheral Pulse Rate PULSE RATE_1%>111 bpm PULSE RATE%>   Apical Heart Rate HEART RATE_1%> HEART RATE%>   Blood Pressure BLOOD PRESSURE_1%>/ BLOOD PRESSURE_1%>94 mmHg BLOOD PRESSURE%> / BLOOD PRESSURE%>82 mmHg                 Immunizations      No Immunizations Documented This Visit          DISCHARGE INFORMATION   Discharge Disposition: H Outpt-Sent Home   Discharge Location:  Home   Discharge Date and Time:  08/13/2018 08:07:57   ED Checkout Date and  Time:  08/13/2018 08:07:57     DEPART REASON INCOMPLETE INFORMATION               Depart Action Incomplete Reason   Interactive View/I&O Recently assessed   Nursing Admit/Discharge/Transfer Summary Recently assessed               Problems      No Problems Documented              Smoking Status      No Smoking Status Documented         PATIENT EDUCATION INFORMATION  Instructions:     Crohn Disease     Follow up:                   With: Address: When:   [facility]  Within 1 week   Comments:   Please call [phone] for an appointment. Return for any new or worsening symptoms.              ED PROVIDER DOCUMENTATION     Patient:   Joe [name], Joe [name]             MRN: [id number]            FIN: [id number]               Age:   12 years     Sex:  Male     DOB:  November 15, 1985   Associated Diagnoses:   Crohn's disease, acute   Author:   MARINE ELSIE [name]      Basic Information   Time seen: Provider Seen (ST)   ED Provider/Time:    Joe ELSIE [name] / 08/13/2018 06:09  .   Additional information: Chief Complaint from Nursing Triage Note   Chief Complaint  Chief Complaint: Pt reports he has Crohns, recently moved from [place],  has been taking humira. Reports weight loss, blood in stool, abdominal pain and unable to sleep due to pain.  Nausea, vomiting, sweats at home. (08/13/18 06:11:00).      History of Present Illness   The patient presents with 33 year old male with history of Crohn's disease on Humira.  Patient recently moved to [place] .  Does not have a doctor here.  States he has had chronic abdominal pain pretty much his whole life.  States he has had severe abdominal pain going on about 2 months now.  Associated with nausea and vomiting.  He has had loose, bloody stools.  No fevers to note of.  He states while the pain is pretty diffuse, is mostly in the left lower quadrant.  No chest pain or shortness of breath.  No back pain.  No urinary symptoms..        Review of Systems   Constitutional symptoms:  No fever, no chills, no generalized weakness, no fatigue.    Skin symptoms:  No rash, no lesion.    Eye symptoms:  Vision unchanged, No blurred vision,    ENMT symptoms:  No sore throat, no sinus pain.    Respiratory symptoms:  No shortness of breath, no cough.    Cardiovascular symptoms:  No chest pain, no palpitations, no syncope, no diaphoresis, no peripheral edema.    Gastrointestinal symptoms:  Abdominal pain, nausea, vomiting, diarrhea, rectal bleeding, No constipation,    Genitourinary symptoms:  No dysuria, no hematuria.    Musculoskeletal symptoms:  No back pain, no Muscle pain.    Neurologic symptoms:  No headache, no dizziness.  Allergy/immunologic symptoms:  No recurrent infections, no impaired immunity.       Health Status   Allergies:    Allergic Reactions (Selected)  Unknown  Remicade- Itching..   Medications:  (Selected)   Inpatient Medications  Ordered  Isovue-300: 100 mL, IV Contrast, Once  Documented Medications  Documented  Blood pressure medication: 0 Refill(s)  Humira Pre-Filled Syringe 40 mg/0.4 mL subcutaneous kit: Subcutaneous, 0 Refill(s)  PROzac 10 mg oral capsule: 10 mg, 1 caps, Oral, Daily, 30  caps, 0 Refill(s)  sulfaSALAzine 500 mg oral tablet: 1,000 mg, 2 tabs, Oral, BID, 360 tabs, 0 Refill(s).      Past Medical/ Family/ Social History   Surgical history: Reviewed as documented in chart.   Family history: Reviewed as documented in chart.   Social history: Reviewed as documented in chart.   Problem list:    Active Problems (3)  Crohn's disease   Depression   Hypertension   .      Physical Examination               Vital Signs   Vital Signs   08/13/2018 6:11 EDT Systolic Blood Pressure 130 mmHg    Diastolic Blood Pressure 94 mmHg  HI    Temperature Oral 37.0 degC    Heart Rate Monitored 109 bpm  HI    Respiratory Rate 24 br/min  HI    SpO2 100 %    Measurements   08/13/2018 6:20 EDT Body Mass Index est meas 24.93 kg/m2    Body Mass Index Measured 24.93 kg/m2   08/13/2018 6:11 EDT Height/Length Measured 190 cm    Weight Dosing 90 kg    General:  Anxious appearing male, nontoxic appearing, mild distress.   Skin:  Warm, dry, intact.    Head:  Normocephalic, atraumatic.    Eye:  Extraocular movements are intact, normal conjunctiva.    Ears, nose, mouth and throat:  Oral mucosa moist.   Cardiovascular:  Tachycardic, good peripheral perfusion, no edema.   Respiratory:  Respirations are non-labored, Symmetrical chest wall expansion, Speaks in full sentences.    Gastrointestinal:  Abdominal exam technically difficult.  Patient is thin, but voluntarily guarding throughout the whole exam..   Neurological:  Alert and oriented to person, place, time, and situation, No focal neurological deficit observed.    Psychiatric:  Cooperative, appropriate mood & affect.       Medical Decision Making   Rationale:  33 year old male with history of Crohn's disease.  Patient never been seen in this hospital system.  Recently moved to [place] .  He is tachycardic, appears somewhat dehydrated.  We will check some basic labs on him give him several liters of IV fluids and check CT scan here.SABRA   Results review:  Lab results : Lab View    08/13/2018 7:05 EDT Estimated Creatinine Clearance 156.13 mL/min   08/13/2018 6:21 EDT WBC 14.8 x10e3/mcL  HI    RBC 4.71 x10e6/mcL    Hgb 7.1 g/dL  LOW    HCT 73.2 %  LOW    MCV 56.7 fL  LOW    MCH 15.1 pg  LOW    MCHC 26.6 g/dL  LOW    RDW 79.4 %  HI    Platelet 974 x10e3/mcL  HI    Immature Plt Absolute 41.9 x10e3/mcL  NA    Immature Plt Percent 4.3 %  NA    MPV 10.2 fL    Neutro Auto 80.7 %  HI  Neutro Absolute 11.9 x10e3/mcL  HI    Immature Grans Percent 0.5 %  NA    Immature Grans Absolute 0.1 x10e3/mcL  NA    Lymph Auto 9.5 %  LOW    Lymph Absolute 1.4 x10e3/mcL    Mono Auto 7.7 %    Mono Absolute 1.1 x10e3/mcL  HI    Eosinophil Percent 1.0 %    Eos Absolute 0.2 x10e3/mcL    Basophil Auto 0.6 %    Baso Absolute 0.1 x10e3/mcL    NRBC Absolute Auto 0.00 x10e3/mcL  NA    NRBC Percent Auto 0.0 %  NA    Anisocytosis Moderate    Elliptocytes Few    Hypochromia Few    PLT estimate Increased    Poikilocytosis Few    Polychromasia Few    RBC morphology Abnormal    Sodium Lvl 134 mmol/L  LOW    Potassium Lvl 3.5 mmol/L    Chloride 95 mmol/L  LOW    CO2 22 mmol/L    Glucose Random 112 mg/dL  HI    BUN 7 mg/dL    Creatinine Lvl 0.8 mg/dL    AGAP 17 mmol/L    Osmolality Calc 267 mOsm/kg  LOW    Calcium Lvl 9.3 mg/dL    Protein Total 8.6 g/dL  HI    Albumin Lvl 3.8 g/dL    Alk Phos 37 unit/L  LOW    AST 12 unit/L    ALT 5 unit/L    eGFR AA 136 mL/min/1.33m    eGFR Non-AA 117 mL/min/1.35m    Bili Total 0.40 mg/dL    Bili Direct <9.79 mg/dL    Lipase Lvl 17 unit/L    Radiology results:  Computed tomography, interpretation:  Overnight radiology report shows evidence of Crohn's flare with no acute abscess or other findings..       Impression and Plan   Diagnosis   Crohn's disease, acute (ICD10-CM K50.90, Discharge, Medical)   Plan   Condition: Stable.    Disposition: Discharged: Time  08/13/2018 07:55:00, to home.    Prescriptions: Launch Meds List (Selected)   Prescriptions  Prescribed  predniSONE 20 mg oral tablet: 40 mg,  2 tabs, Oral, Daily, for 5 days, 10 tabs, 0 Refill(s).    Patient was given the following educational materials: Crohn Disease.    Follow up with: [facility] Within 1 week Please call [phone] for an appointment.  Return for any new or worsening symptoms..    Counseled: Patient, Regarding diagnosis, Regarding diagnostic results, Regarding treatment plan, Regarding prescription, Patient indicated understanding of instructions.

## 2018-08-13 NOTE — ED Notes (Signed)
ED Triage Note       ED Triage Adult Entered On:  08/13/2018 6:13 EDT    Performed On:  08/13/2018 6:11 EDT by FABEL, RN, HEATHER N               Triage   Numeric Rating Pain Scale :   8   Infectious Disease Documentation :   Document assessment   Temperature Oral :   37.0 degC(Converted to: 03.4 degF)    Systolic Blood Pressure :   742 mmHg   Diastolic Blood Pressure :   94 mmHg (HI)    SpO2 :   100 %   Oxygen Therapy :   Room air   Patient presentation :   HR > 100   Chief Complaint or Presentation suggest infection :   Yes   Weight Dosing :   90 kg(Converted to: 198 lb 7 oz)    Height :   190 cm(Converted to: 6 ft 3 in)    Body Mass Index Dosing :   25 kg/m2   ED General Section :   Document assessment   ED Allergies Section :   Document assessment   ED Reason for Visit Section :   Document assessment   ED Home Meds Section :   Document assessment   FABEL, RN, HEATHER N - 08/13/2018 6:14 EDT   Chief Complaint :   Pt reports he has Crohns, recently moved from [place], has been taking humira. Reports weight loss, blood in stool, abdominal pain and unable to sleep due to pain.  Nausea, vomiting, sweats at home.   [place] Mode of Arrival :   Walking   FABEL, RN, [place] N - 08/13/2018 6:11 EDT   Heart Rate Monitored :   109 bpm (HI)    FABEL, RN, HEATHER N - 08/13/2018 6:14 EDT   Respiratory Rate :   24 br/min (HI)    FABEL, RN, HEATHER N - 08/13/2018 6:11 EDT   FABEL, RN, HEATHER N - 08/13/2018 6:11 EDT   DCP GENERIC CODE   Tracking Acuity :   3   FABEL, RN, HEATHER N - 08/13/2018 6:14 EDT   Tracking Group :   ED [facility] Main Tracking Group   FABEL, RN, HEATHER N - 08/13/2018 6:11 EDT   Pregnancy Status :   N/A   FABEL, RN, HEATHER N - 08/13/2018 6:11 EDT   ID Risk Screen Symptoms   Recent Travel History :   No recent travel   Close Contact with COVID-19 ID :   No   Last 14 days COVID-19 ID :   No   TB Symptom Screen :   No symptoms   C. diff Symptom/History ID :   Neither of the above   FABEL, RN, HEATHER N - 08/13/2018 6:14 EDT   Allergies   (As  Of: 08/13/2018 06:20:36 EDT)   Allergies (Active)   Remicade  Estimated Onset Date:   Unspecified ; Reactions:   Itching ; Created By:   Delight Stare, RN, HEATHER N; Reaction Status:   Active ; Category:   Drug ; Substance:   Remicade ; Type:   Allergy ; Severity:   Unknown ; Updated By:   Erin Hearing; Reviewed Date:   08/13/2018 6:15 EDT        Psycho-Social   Last 3 mo, thoughts killing self/others :   Patient denies   FABEL, RN, HEATHER N - 08/13/2018 6:14 EDT   ED Home Med List  Medication List   (As Of: 08/13/2018 06:20:36 EDT)   Normal Order    iopamidol 61% Inj Soln 100 mL  :   iopamidol 61% Inj Soln 100 mL ; Status:   Ordered ; Ordered As Mnemonic:   Isovue-300 ; Simple Display Line:   100 mL, IV Contrast, Once ; Ordering Provider:   Paulene Floor; Catalog Code:   iopamidol ; Order Dt/Tm:   08/13/2018 06:15:02 EDT          morphine 4 mg/mL preservative-free Sol 1 mL  :   morphine 4 mg/mL preservative-free Sol 1 mL ; Status:   Ordered ; Ordered As Mnemonic:   morphine ; Simple Display Line:   4 mg, 1 mL, IV Push, Once ; Ordering Provider:   Paulene Floor; Catalog Code:   morphine ; Order Dt/Tm:   08/13/2018 06:15:01 EDT          ondansetron 2 mg/mL Inj Soln 2 mL  :   ondansetron 2 mg/mL Inj Soln 2 mL ; Status:   Ordered ; Ordered As Mnemonic:   Zofran ; Simple Display Line:   4 mg, 2 mL, IV Push, Once ; Ordering Provider:   Paulene Floor; Catalog Code:   ondansetron ; Order Dt/Tm:   08/13/2018 06:15:01 EDT          Sodium Chloride 0.9% intravenous solution Bolus  :   Sodium Chloride 0.9% intravenous solution Bolus ; Status:   Ordered ; Ordered As Mnemonic:   Sodium Chloride 0.9% bolus ; Simple Display Line:   2,000 mL, 2000 mL/hr, IV Piggyback, Once ; Ordering Provider:   Virgilio Belling T; Catalog Code:   Sodium Chloride 0.9% ; Order Dt/Tm:   08/13/2018 06:15:01 EDT            Home Meds    adalimumab  :   adalimumab ; Status:   Documented ; Ordered As Mnemonic:   Humira Pre-Filled Syringe 40  mg/0.4 mL subcutaneous kit ; Simple Display Line:   Subcutaneous, 0 Refill(s) ; Catalog Code:   adalimumab ; Order Dt/Tm:   08/13/2018 06:17:55 EDT          FLUoxetine  :   FLUoxetine ; Status:   Documented ; Ordered As Mnemonic:   PROzac 10 mg oral capsule ; Simple Display Line:   10 mg, 1 caps, Oral, Daily, 30 caps, 0 Refill(s) ; Catalog Code:   FLUoxetine ; Order Dt/Tm:   08/13/2018 06:19:42 EDT          Misc Rx Supply  :   Misc Rx Supply ; Status:   Documented ; Ordered As Mnemonic:   Blood pressure medication ; Simple Display Line:   0 Refill(s) ; Catalog Code:   Misc Rx Supply ; Order Dt/Tm:   08/13/2018 45:40:98 EDT          sulfaSALAzine  :   sulfaSALAzine ; Status:   Documented ; Ordered As Mnemonic:   sulfaSALAzine 500 mg oral tablet ; Simple Display Line:   1,000 mg, 2 tabs, Oral, BID, 360 tabs, 0 Refill(s) ; Catalog Code:   sulfaSALAzine ; Order Dt/Tm:   08/13/2018 11:91:47 EDT            ED Reason for Visit   (As Of: 08/13/2018 06:20:36 EDT)   Problems(Active)    Crohn's disease (SNOMED CT  :82956213 )  Name of Problem:   Crohn's disease ; Recorder:   FABEL, RN, HEATHER N; Confirmation:  Confirmed ; Classification:   Patient Stated ; Code:   [id number] ; Contributor System:   PowerChart ; Last Updated:   08/13/2018 6:16 EDT ; Life Cycle Date:   08/13/2018 ; Life Cycle Status:   Active ; Vocabulary:   SNOMED CT          Diagnoses(Active)    Abdominal pain  Date:   08/13/2018 ; Diagnosis Type:   Reason For Visit ; Confirmation:   Complaint of ; Clinical Dx:   Abdominal pain ; Classification:   Medical ; Clinical Service:   Emergency medicine ; Code:   PNED ; Probability:   0 ; Diagnosis Code:   [id number]

## 2022-06-03 ENCOUNTER — Inpatient Hospital Stay (HOSPITAL_COMMUNITY)
Admission: EM | Admit: 2022-06-03 | Discharge: 2022-06-08 | DRG: 330 | Disposition: A | Discharge status: Home / Self Care | Payer: Medicare Other | Attending: Student in an Organized Health Care Education/Training Program | Admitting: Student in an Organized Health Care Education/Training Program

## 2022-06-03 ENCOUNTER — Other Ambulatory Visit: Payer: Self-pay

## 2022-06-03 DIAGNOSIS — M1990 Unspecified osteoarthritis, unspecified site: Secondary | ICD-10-CM | POA: Diagnosis present

## 2022-06-03 DIAGNOSIS — E669 Obesity, unspecified: Secondary | ICD-10-CM | POA: Diagnosis present

## 2022-06-03 DIAGNOSIS — K5010 Crohn's disease of large intestine without complications: Secondary | ICD-10-CM | POA: Insufficient documentation

## 2022-06-03 DIAGNOSIS — K435 Parastomal hernia without obstruction or  gangrene: Principal | ICD-10-CM

## 2022-06-03 DIAGNOSIS — K469 Unspecified abdominal hernia without obstruction or gangrene: Secondary | ICD-10-CM

## 2022-06-03 DIAGNOSIS — Z888 Allergy status to other drugs, medicaments and biological substances status: Secondary | ICD-10-CM

## 2022-06-03 DIAGNOSIS — I10 Essential (primary) hypertension: Secondary | ICD-10-CM | POA: Diagnosis present

## 2022-06-03 DIAGNOSIS — Z932 Ileostomy status: Secondary | ICD-10-CM

## 2022-06-03 DIAGNOSIS — K50812 Crohn's disease of both small and large intestine with intestinal obstruction: Secondary | ICD-10-CM | POA: Diagnosis present

## 2022-06-03 DIAGNOSIS — Z87891 Personal history of nicotine dependence: Secondary | ICD-10-CM

## 2022-06-03 DIAGNOSIS — K6289 Other specified diseases of anus and rectum: Secondary | ICD-10-CM | POA: Diagnosis present

## 2022-06-03 DIAGNOSIS — G4730 Sleep apnea, unspecified: Secondary | ICD-10-CM | POA: Diagnosis present

## 2022-06-03 DIAGNOSIS — Z8249 Family history of ischemic heart disease and other diseases of the circulatory system: Secondary | ICD-10-CM

## 2022-06-03 DIAGNOSIS — Z79899 Other long term (current) drug therapy: Secondary | ICD-10-CM

## 2022-06-03 DIAGNOSIS — K2100 Gastro-esophageal reflux disease with esophagitis, without bleeding: Secondary | ICD-10-CM | POA: Diagnosis present

## 2022-06-03 DIAGNOSIS — M25551 Pain in right hip: Secondary | ICD-10-CM | POA: Diagnosis present

## 2022-06-03 DIAGNOSIS — Z7969 Long term (current) use of other immunomodulators and immunosuppressants: Secondary | ICD-10-CM

## 2022-06-03 DIAGNOSIS — K50819 Crohn's disease of both small and large intestine with unspecified complications: Secondary | ICD-10-CM | POA: Diagnosis present

## 2022-06-03 DIAGNOSIS — Z6831 Body mass index (BMI) 31.0-31.9, adult: Secondary | ICD-10-CM

## 2022-06-03 DIAGNOSIS — G8929 Other chronic pain: Secondary | ICD-10-CM | POA: Diagnosis present

## 2022-06-03 HISTORY — DX: Other complications of anesthesia, initial encounter: T88.59XA

## 2022-06-03 HISTORY — DX: Gastro-esophageal reflux disease without esophagitis: K21.9

## 2022-06-03 HISTORY — DX: Anxiety disorder, unspecified: F41.9

## 2022-06-03 HISTORY — DX: Depression, unspecified: F32.A

## 2022-06-03 LAB — URINALYSIS, ROUTINE W REFLEX MICROSCOPIC
Bilirubin Urine: NEGATIVE
Glucose, UA: NEGATIVE mg/dL
Hgb urine dipstick: NEGATIVE
Ketones, ur: NEGATIVE mg/dL
Leukocytes,Ua: NEGATIVE
Nitrite: NEGATIVE
Protein, ur: NEGATIVE mg/dL
Specific Gravity, Urine: 1.02 (ref 1.005–1.030)
pH: 5 (ref 5.0–8.0)

## 2022-06-03 LAB — COMPREHENSIVE METABOLIC PANEL
ALT: 48 U/L — ABNORMAL HIGH (ref 0–44)
AST: 39 U/L (ref 15–41)
Albumin: 4.3 g/dL (ref 3.5–5.0)
Alkaline Phosphatase: 61 U/L (ref 38–126)
Anion gap: 14 (ref 5–15)
BUN: 24 mg/dL — ABNORMAL HIGH (ref 6–20)
CO2: 23 mmol/L (ref 22–32)
Calcium: 9.7 mg/dL (ref 8.9–10.3)
Chloride: 97 mmol/L — ABNORMAL LOW (ref 98–111)
Creatinine, Ser: 1.19 mg/dL (ref 0.61–1.24)
GFR, Estimated: >60 mL/min (ref >60)
Glucose, Bld: 109 mg/dL — ABNORMAL HIGH (ref 70–99)
Potassium: 4.1 mmol/L (ref 3.5–5.1)
Sodium: 134 mmol/L — ABNORMAL LOW (ref 135–145)
Total Bilirubin: 1 mg/dL (ref 0.3–1.2)
Total Protein: 9.4 g/dL — ABNORMAL HIGH (ref 6.5–8.1)

## 2022-06-03 LAB — CBC
HCT: 47.4 % (ref 39.0–52.0)
Hemoglobin: 15.7 g/dL (ref 13.0–17.0)
MCH: 27.5 pg (ref 26.0–34.0)
MCHC: 33.1 g/dL (ref 30.0–36.0)
MCV: 83.2 fL (ref 80.0–100.0)
Platelets: 426 10*3/uL — ABNORMAL HIGH (ref 150–400)
RBC: 5.7 MIL/uL (ref 4.22–5.81)
RDW: 14.9 % (ref 11.5–15.5)
WBC: 12.1 10*3/uL — ABNORMAL HIGH (ref 4.0–10.5)
nRBC: 0 % (ref 0.0–0.2)

## 2022-06-03 LAB — LIPASE, BLOOD: Lipase: 33 U/L (ref 11–51)

## 2022-06-03 MED ORDER — OXYCODONE-ACETAMINOPHEN 5-325 MG PO TABS
1.0000 | ORAL_TABLET | Freq: Once | ORAL | Status: AC
  Administered 2022-06-03: 1 via ORAL
  Filled 2022-06-03: qty 1

## 2022-06-03 NOTE — ED Triage Notes (Signed)
Patient reports persistent pain across lower abdomen with diarrhea and nausea for several days .

## 2022-06-04 ENCOUNTER — Encounter (HOSPITAL_COMMUNITY): Payer: Self-pay | Admitting: Internal Medicine

## 2022-06-04 ENCOUNTER — Emergency Department (HOSPITAL_COMMUNITY): Payer: Medicare Other

## 2022-06-04 DIAGNOSIS — Z888 Allergy status to other drugs, medicaments and biological substances status: Secondary | ICD-10-CM | POA: Diagnosis not present

## 2022-06-04 DIAGNOSIS — K449 Diaphragmatic hernia without obstruction or gangrene: Secondary | ICD-10-CM | POA: Diagnosis not present

## 2022-06-04 DIAGNOSIS — K50818 Crohn's disease of both small and large intestine with other complication: Secondary | ICD-10-CM | POA: Diagnosis not present

## 2022-06-04 DIAGNOSIS — Z8249 Family history of ischemic heart disease and other diseases of the circulatory system: Secondary | ICD-10-CM | POA: Diagnosis not present

## 2022-06-04 DIAGNOSIS — E669 Obesity, unspecified: Secondary | ICD-10-CM | POA: Diagnosis present

## 2022-06-04 DIAGNOSIS — M1990 Unspecified osteoarthritis, unspecified site: Secondary | ICD-10-CM | POA: Diagnosis present

## 2022-06-04 DIAGNOSIS — I10 Essential (primary) hypertension: Secondary | ICD-10-CM | POA: Diagnosis present

## 2022-06-04 DIAGNOSIS — K50819 Crohn's disease of both small and large intestine with unspecified complications: Secondary | ICD-10-CM | POA: Diagnosis not present

## 2022-06-04 DIAGNOSIS — Z932 Ileostomy status: Secondary | ICD-10-CM | POA: Diagnosis not present

## 2022-06-04 DIAGNOSIS — K5010 Crohn's disease of large intestine without complications: Secondary | ICD-10-CM | POA: Insufficient documentation

## 2022-06-04 DIAGNOSIS — K50812 Crohn's disease of both small and large intestine with intestinal obstruction: Secondary | ICD-10-CM | POA: Diagnosis present

## 2022-06-04 DIAGNOSIS — K433 Parastomal hernia with obstruction, without gangrene: Secondary | ICD-10-CM

## 2022-06-04 DIAGNOSIS — K2100 Gastro-esophageal reflux disease with esophagitis, without bleeding: Secondary | ICD-10-CM | POA: Diagnosis present

## 2022-06-04 DIAGNOSIS — Z87891 Personal history of nicotine dependence: Secondary | ICD-10-CM | POA: Diagnosis not present

## 2022-06-04 DIAGNOSIS — K6289 Other specified diseases of anus and rectum: Secondary | ICD-10-CM | POA: Diagnosis present

## 2022-06-04 DIAGNOSIS — K435 Parastomal hernia without obstruction or  gangrene: Secondary | ICD-10-CM | POA: Diagnosis present

## 2022-06-04 DIAGNOSIS — M25551 Pain in right hip: Secondary | ICD-10-CM | POA: Diagnosis present

## 2022-06-04 DIAGNOSIS — K469 Unspecified abdominal hernia without obstruction or gangrene: Principal | ICD-10-CM | POA: Diagnosis present

## 2022-06-04 DIAGNOSIS — Z79899 Other long term (current) drug therapy: Secondary | ICD-10-CM | POA: Diagnosis not present

## 2022-06-04 DIAGNOSIS — G4730 Sleep apnea, unspecified: Secondary | ICD-10-CM | POA: Diagnosis present

## 2022-06-04 DIAGNOSIS — G8929 Other chronic pain: Secondary | ICD-10-CM | POA: Diagnosis present

## 2022-06-04 DIAGNOSIS — Z6831 Body mass index (BMI) 31.0-31.9, adult: Secondary | ICD-10-CM | POA: Diagnosis not present

## 2022-06-04 DIAGNOSIS — Z7969 Long term (current) use of other immunomodulators and immunosuppressants: Secondary | ICD-10-CM | POA: Diagnosis not present

## 2022-06-04 LAB — MAGNESIUM: Magnesium: 2 mg/dL (ref 1.7–2.4)

## 2022-06-04 LAB — TSH: TSH: 3.347 u[IU]/mL (ref 0.350–4.500)

## 2022-06-04 LAB — PHOSPHORUS: Phosphorus: 3.2 mg/dL (ref 2.5–4.6)

## 2022-06-04 LAB — HIV ANTIBODY (ROUTINE TESTING W REFLEX): HIV Screen 4th Generation wRfx: NONREACTIVE

## 2022-06-04 MED ORDER — HYDROMORPHONE HCL 1 MG/ML IJ SOLN
1.0000 mg | Freq: Once | INTRAMUSCULAR | Status: AC
  Administered 2022-06-04: 1 mg via INTRAVENOUS
  Filled 2022-06-04: qty 1

## 2022-06-04 MED ORDER — METOPROLOL TARTRATE 5 MG/5ML IV SOLN: 5.0000 mg | Freq: Four times a day (QID) | INTRAVENOUS | Status: DC | PRN

## 2022-06-04 MED ORDER — OXYCODONE HCL 5 MG PO TABS
5.0000 mg | ORAL_TABLET | ORAL | Status: DC | PRN
  Filled 2022-06-04: qty 2

## 2022-06-04 MED ORDER — LACTATED RINGERS IV SOLN: INTRAVENOUS | Status: DC

## 2022-06-04 MED ORDER — MAGIC MOUTHWASH: 15.0000 mL | Freq: Four times a day (QID) | ORAL | Status: DC | PRN

## 2022-06-04 MED ORDER — MENTHOL 3 MG MT LOZG
1.0000 | LOZENGE | OROMUCOSAL | Status: DC | PRN
  Administered 2022-06-06 (×2): 3 mg via ORAL
  Filled 2022-06-04: qty 9

## 2022-06-04 MED ORDER — PROCHLORPERAZINE EDISYLATE 10 MG/2ML IJ SOLN: 5.0000 mg | INTRAMUSCULAR | Status: DC | PRN

## 2022-06-04 MED ORDER — ALUM & MAG HYDROXIDE-SIMETH 200-200-20 MG/5ML PO SUSP: 30.0000 mL | Freq: Four times a day (QID) | ORAL | Status: DC | PRN

## 2022-06-04 MED ORDER — METHOCARBAMOL 500 MG PO TABS
1000.0000 mg | ORAL_TABLET | Freq: Four times a day (QID) | ORAL | Status: DC | PRN
  Administered 2022-06-05 – 2022-06-07 (×6): 1000 mg via ORAL
  Filled 2022-06-04 (×6): qty 2

## 2022-06-04 MED ORDER — ACETAMINOPHEN 650 MG RE SUPP: 650.0000 mg | Freq: Four times a day (QID) | RECTAL | Status: DC | PRN

## 2022-06-04 MED ORDER — DIPHENHYDRAMINE HCL 50 MG/ML IJ SOLN: 12.5000 mg | Freq: Four times a day (QID) | INTRAMUSCULAR | Status: DC | PRN

## 2022-06-04 MED ORDER — METHOCARBAMOL 1000 MG/10ML IJ SOLN: 1000.0000 mg | Freq: Four times a day (QID) | INTRAVENOUS | Status: DC | PRN

## 2022-06-04 MED ORDER — ACETAMINOPHEN 325 MG PO TABS: 325.0000 mg | ORAL_TABLET | Freq: Four times a day (QID) | ORAL | Status: DC | PRN

## 2022-06-04 MED ORDER — ONDANSETRON HCL 4 MG/2ML IJ SOLN
4.0000 mg | Freq: Four times a day (QID) | INTRAMUSCULAR | Status: DC | PRN
  Administered 2022-06-04: 4 mg via INTRAVENOUS
  Filled 2022-06-04 (×2): qty 2

## 2022-06-04 MED ORDER — SODIUM CHLORIDE 0.9 % IV SOLN: 8.0000 mg | Freq: Four times a day (QID) | INTRAVENOUS | Status: DC | PRN

## 2022-06-04 MED ORDER — LACTATED RINGERS IV BOLUS: 1000.0000 mL | Freq: Three times a day (TID) | INTRAVENOUS | Status: AC | PRN

## 2022-06-04 MED ORDER — LACTATED RINGERS IV BOLUS: 1000.0000 mL | Freq: Once | INTRAVENOUS | Status: DC

## 2022-06-04 MED ORDER — LACTATED RINGERS IV BOLUS
1000.0000 mL | Freq: Once | INTRAVENOUS | Status: AC
  Administered 2022-06-04: 1000 mL via INTRAVENOUS

## 2022-06-04 MED ORDER — LIP MEDEX EX OINT
TOPICAL_OINTMENT | Freq: Two times a day (BID) | CUTANEOUS | Status: DC
  Administered 2022-06-05: 1 via TOPICAL
  Filled 2022-06-04 (×2): qty 7

## 2022-06-04 MED ORDER — ONDANSETRON HCL 4 MG/2ML IJ SOLN
4.0000 mg | Freq: Once | INTRAMUSCULAR | Status: AC
  Administered 2022-06-04: 4 mg via INTRAVENOUS
  Filled 2022-06-04: qty 2

## 2022-06-04 MED ORDER — SALINE SPRAY 0.65 % NA SOLN: 1.0000 | NASAL | Status: DC | PRN

## 2022-06-04 MED ORDER — PHENOL 1.4 % MT LIQD: 2.0000 | OROMUCOSAL | Status: DC | PRN

## 2022-06-04 MED ORDER — FLUTICASONE PROPIONATE 50 MCG/ACT NA SUSP
1.0000 | Freq: Every day | NASAL | Status: DC
  Administered 2022-06-04 – 2022-06-08 (×3): 1 via NASAL
  Filled 2022-06-04 (×2): qty 16

## 2022-06-04 MED ORDER — IOHEXOL 350 MG/ML SOLN
100.0000 mL | Freq: Once | INTRAVENOUS | Status: AC | PRN
  Administered 2022-06-04: 100 mL via INTRAVENOUS

## 2022-06-04 MED ORDER — HEPARIN SODIUM (PORCINE) 5000 UNIT/ML IJ SOLN
5000.0000 [IU] | Freq: Three times a day (TID) | INTRAMUSCULAR | Status: DC
  Administered 2022-06-04 – 2022-06-07 (×8): 5000 [IU] via SUBCUTANEOUS
  Filled 2022-06-04 (×7): qty 1

## 2022-06-04 MED ORDER — HYDROXYZINE HCL 25 MG PO TABS
25.0000 mg | ORAL_TABLET | Freq: Every evening | ORAL | Status: DC | PRN
  Administered 2022-06-06: 50 mg via ORAL
  Filled 2022-06-04: qty 2

## 2022-06-04 MED ORDER — HYDROMORPHONE HCL 1 MG/ML IJ SOLN
0.5000 mg | INTRAMUSCULAR | Status: DC | PRN
  Administered 2022-06-04 – 2022-06-05 (×6): 2 mg via INTRAVENOUS
  Administered 2022-06-05: 0.5 mg via INTRAVENOUS
  Administered 2022-06-05: 2 mg via INTRAVENOUS
  Filled 2022-06-04 (×6): qty 2
  Filled 2022-06-04: qty 1
  Filled 2022-06-04 (×2): qty 2

## 2022-06-04 NOTE — Consult Note (Signed)
Referring Provider:  Dr. Jaynee Eagles. [name] ([facility])         Primary Care Physician:  None Primary Gastroenterologist: [facility] (Dr. Henrene Pastor in the past, Dr. Alisia Ferrari at [facility], [place])            Reason for Consultation: parastomal hernia                   ASSESSMENT /  PLAN   37 y.o. male with a history of complicated ileocolonic Crohn's disease (diagnosed 2005), remote opioid dependence, arthropathy, and more recently total abdominal colectomy with end ileostomy (July 2021 on Stelara since April 2021), GERD with mild esophagitis who is presenting with parastomal hernia and pain.  Parastomal hernia --patient with acute parastomal hernia pain and distention starting after vigorous coughing; ileostomy in the setting of prior ileocolonic Crohn's disease status post subtotal colectomy.  See #2 regarding Crohn's disease.  I expect this will need operative management in the acute setting as medical therapy would not resolve this issue.  My suspicion is this is not active Crohn's disease but rather ileal thickening as a result of the hernia itself.  Given the higher output I do think there is an element of partial obstruction. -- Surgical management; will likely need acute surgical repair of parastomal hernia and ileostomy -- At this point I do not think ileoscopy would change management, see #2  2.  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  Per patient he has been adherent with Stelara 90 mcg every 4 weeks (which is rather high-dose therapy as this is typically an every 8-week injection; it is presumed that they went with 4 weeks due to the severity of his disease requiring surgery).  He is interested in reversal of ileostomy however we need to get his parastomal hernia repaired and  have him follow-up with [place] as an outpatient, get a full assessment of disease activity once hernia is no longer an issue prior to consideration of ileostomy reversal. -- Parastomal hernia management as above -- Would redose Stelara around April 15 assuming no further complications -- Outpatient follow-up to establish GI care back in [place]; outpatient consideration of ileostomy and rectal stump exam -- I do not recommend acute steroid therapy as I am not convinced that he has any significant ileitis at this point but rather the thickening seen by CT is related to the parastomal hernia itself.  Medical therapy would not correct parastomal hernia.  Patient understands this.    HPI:     Troy Ramirez is a 37 y.o. male with a history of complicated ileocolonic Crohn's disease (diagnosed 2005), remote opioid dependence, arthropathy, and more recently total abdominal colectomy with end ileostomy (July 2021 on Stelara since April 2021), GERD with mild esophagitis who is presenting with parastomal hernia and pain.  IBD history includes: Diagnosis 2005; ileal and colonic disease; complicated by intra-abdominal abscesses requiring drainage and ultimate abdominal colectomy in 2021 Prior use of infliximab, stopped after infusion reaction Prior high-dose Humira 80 mg every 7 days Previous exposure to azathioprine and methotrexate Stelara started 90 mcg subcu every 28 days April 2021  He previously lived in [place] but moved to [place] area for 4-year period where he establish gastroenterology care with [employer] [facility].  It was therefore he developed complicated disease requiring abdominal colectomy and end  ileostomy.  He has subsequently moved back to [place], where he is from.  His last GI care was in September 2022 when he had an ileoscopy and exam of the rectosigmoid stump.  Ileal biopsies at that time were normal and rectal biopsies showed chronic active colitis.  He had been doing  well.  He gained over 100 pounds since his surgery because he was finally able to eat.  He then reports he developed sleep apnea and wanted to lose weight for general wellbeing and so he has lost about 60 pounds intentionally by watching his diet and becoming more physically active with his dog.  In the last 4 days he has had allergies with significant coughing and he developed more noticeable bulging at the ileostomy and significant pain.  He also noticed increased in ostomy output which was nonbloody and at times nausea with several episodes of vomiting.  No fever.  He reports having been adherent to Stelara he takes his dose around the 15th of every month.  Cross-sectional imaging has been done, see below.  Summary: Query mild ileal thickening but no other evidence of active Crohn's disease.  Parastomal hernia is seen containing fat and small bowel without significant proximal obstruction.  Rectal stump appears intact and decompressed.  His hope is for eventual ostomy takedown and reanastomosis.  The stoma is limiting to his function and he would prefer to have this taken down eventually.  He is on medical disability for his Crohn's disease.   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    Crohn disease ([place])    Hypertension    Internal hemorrhoids     Past Surgical History:  Procedure Laterality Date   neg hx      Prior to Admission medications   Medication Sig Start Date End Date Taking? Authorizing Provider  acetaminophen (TYLENOL) 500 MG tablet Take 1,000 mg by mouth daily as needed for moderate pain, fever or headache.   Yes [provider]  MAGNESIUM OXIDE PO Take 1 tablet by mouth daily.   Yes [provider]  ustekinumab (STELARA) 90 MG/ML SOSY injection Inject 90 mg into the skin every 28 (twenty-eight) days. 09/11/21   Yes [provider]  pantoprazole (PROTONIX) 40 MG tablet Take 1 tablet (40 mg total) by mouth 2 (two) times daily before a meal. Reported on 05/19/2015 Patient not taking: Reported on 06/04/2022 07/12/15   Debbe Odea, MD    Current Facility-Administered Medications  Medication Dose Route Frequency Provider Last Rate Last Admin   acetaminophen (TYLENOL) suppository 650 mg  650 mg Rectal Q6H PRN Michael Boston, MD       acetaminophen (TYLENOL) tablet 325-650 mg  325-650 mg Oral Q6H PRN Michael Boston, MD       alum & mag hydroxide-simeth (MAALOX/MYLANTA) 200-200-20 MG/5ML suspension 30 mL  30 mL Oral Q6H PRN Michael Boston, MD       fluticasone (FLONASE) 50 MCG/ACT nasal spray 1 spray  1 spray Each Nare Daily Rick Duff, MD   1 spray at 06/04/22 1611   heparin injection 5,000 Units  5,000 Units Subcutaneous Q8H Rick Duff, MD   5,000 Units at 06/04/22 1344   HYDROmorphone (DILAUDID) injection 0.5-2 mg  0.5-2 mg Intravenous Q2H PRN Michael Boston, MD   2 mg at 06/04/22 1611  hydrOXYzine (ATARAX) tablet 25-50 mg  25-50 mg Oral QHS PRN Rick Duff, MD       lactated ringers bolus 1,000 mL  1,000 mL Intravenous Once Michael Boston, MD   Stopped at 06/04/22 [id number]   lactated ringers bolus 1,000 mL  1,000 mL Intravenous Q8H PRN Michael Boston, MD       lactated ringers infusion   Intravenous Continuous Mesner, Corene Cornea, MD 125 mL/hr at 06/04/22 1325 Started During Downtime at 06/04/22 1325   lip balm (CARMEX) ointment   Topical BID Michael Boston, MD   Given at 06/04/22 0907   magic mouthwash  15 mL Oral QID PRN Michael Boston, MD       menthol-cetylpyridinium (CEPACOL) lozenge 3 mg  1 lozenge Oral PRN Michael Boston, MD       methocarbamol (ROBAXIN) 1,000 mg in dextrose 5 % 100 mL IVPB  1,000 mg Intravenous Q6H PRN Michael Boston, MD       methocarbamol (ROBAXIN) tablet 1,000 mg  1,000 mg Oral Q6H PRN Michael Boston, MD       metoprolol tartrate (LOPRESSOR) injection 5 mg  5 mg  Intravenous Q6H PRN Michael Boston, MD       ondansetron [facility]) injection 4 mg  4 mg Intravenous Q6H PRN Michael Boston, MD   4 mg at 06/04/22 1343   Or   ondansetron (ZOFRAN) 8 mg in sodium chloride 0.9 % 50 mL IVPB  8 mg Intravenous Q6H PRN Michael Boston, MD       oxyCODONE (Oxy IR/ROXICODONE) immediate release tablet 5-10 mg  5-10 mg Oral Q4H PRN Michael Boston, MD       phenol (CHLORASEPTIC) mouth spray 2 spray  2 spray Mouth/Throat PRN Michael Boston, MD       prochlorperazine (COMPAZINE) injection 5-10 mg  5-10 mg Intravenous Q4H PRN Michael Boston, MD       sodium chloride (OCEAN) 0.65 % nasal spray 1 spray  1 spray Each Nare PRN Rick Duff, MD        Allergies as of 06/03/2022 - Review Complete 09/18/2015  Allergen Reaction Noted   Remicade [infliximab] Anaphylaxis and Swelling 08/29/2012    Family History  Problem Relation Age of Onset   Diabetes Other        Both sides   Heart disease Other        both sides   Breast cancer Maternal Grandmother    Prostate cancer Paternal Grandfather    Colon cancer Neg Hx    Diabetes Mellitus II Mother    Hypertension Father    Diabetes Mellitus II Father     Social History   Tobacco Use   Smoking status: Never   Smokeless tobacco: Never  Substance Use Topics   Alcohol use: Yes    Alcohol/week: 0.0 standard drinks of alcohol    Comment: rarely   Drug use: Yes    Types: Marijuana    Comment: last used about two hr ago 05/19/2015    Review of Systems: All systems reviewed and negative except where noted in HPI.  Physical Exam: Vital signs in last 24 hours: Temp:  [97.9 F (36.6 C)-98.7 F (37.1 C)] 98.4 F (36.9 C) (03/29 1116) Pulse Rate:  [75-124] 85 (03/29 1324) Resp:  [13-24] 20 (03/29 1324) BP: (116-153)/(78-98) 149/91 (03/29 1324) SpO2:  [92 %-100 %] 100 % (03/29 1324) Last BM Date : 06/04/22 Gen: awake, alert, NAD HEENT: anicteric  CV: RRR, no mrg Pulm: CTA b/l Abd: soft, ileostomy RLQ, peristomal  hernia with fullness and tenderness which is not reducible; effluent in the ostomy bag is nonbloody Ext: no c/c/e Neuro: nonfocal   Intake/Output from previous day: 03/28 0701 - 03/29 0700 In: 1998 [IV Piggyback:1998] Out: -  Intake/Output this shift: Total I/O In: 819.4 [I.V.:819.4] Out: -   Lab Results: Recent Labs    06/03/22 1946  WBC 12.1*  HGB 15.7  HCT 47.4  PLT 426*   BMET Recent Labs    06/03/22 1946  NA 134*  K 4.1  CL 97*  CO2 23  GLUCOSE 109*  BUN 24*  CREATININE 1.19  CALCIUM 9.7   LFT Recent Labs    06/03/22 1946  PROT 9.4*  ALBUMIN 4.3  AST 39  ALT 48*  ALKPHOS 61  BILITOT 1.0      .    Latest Ref Rng & Units 06/03/2022    7:46 PM 09/17/2015   10:30 PM 07/12/2015    5:42 AM  CBC  WBC 4.0 - 10.5 K/uL 12.1  12.0  11.0   Hemoglobin 13.0 - 17.0 g/dL 15.7  10.6  9.4   Hematocrit 39.0 - 52.0 % 47.4  33.1  29.8   Platelets 150 - 400 K/uL 426  441  350     .    Latest Ref Rng & Units 06/03/2022    7:46 PM 09/17/2015   10:30 PM 07/11/2015    8:01 AM  CMP  Glucose 70 - 99 mg/dL 109  90  101   BUN 6 - 20 mg/dL 24  10  10    Creatinine 0.61 - 1.24 mg/dL 1.19  0.80  0.62   Sodium 135 - 145 mmol/L 134  136  140   Potassium 3.5 - 5.1 mmol/L 4.1  3.1  3.5   Chloride 98 - 111 mmol/L 97  105  106   CO2 22 - 32 mmol/L 23  22  26    Calcium 8.9 - 10.3 mg/dL 9.7  9.4  8.8   Total Protein 6.5 - 8.1 g/dL 9.4     Total Bilirubin 0.3 - 1.2 mg/dL 1.0     Alkaline Phos 38 - 126 U/L 61     AST 15 - 41 U/L 39     ALT 0 - 44 U/L 48      Studies/Results: CT ABDOMEN PELVIS W CONTRAST  Result Date: 06/04/2022 CLINICAL DATA:  Acute nonlocalized abdominal pain. Lower abdominal pain with diarrhea and nausea for several days. EXAM: CT ABDOMEN AND PELVIS WITH CONTRAST TECHNIQUE: Multidetector CT imaging of the abdomen and pelvis was performed using the standard protocol following bolus administration of intravenous contrast. RADIATION DOSE REDUCTION: This exam  was performed according to the departmental dose-optimization program which includes automated exposure control, adjustment of the mA and/or kV according to patient size and/or use of iterative reconstruction technique. CONTRAST:  182mL OMNIPAQUE IOHEXOL 350 MG/ML SOLN COMPARISON:  07/09/2015 FINDINGS: Lower chest: Lung bases are clear. Hepatobiliary: No focal liver abnormality is seen. No gallstones, gallbladder wall thickening, or biliary dilatation. Pancreas: Unremarkable. No pancreatic ductal dilatation or surrounding inflammatory changes. Spleen: Normal in size without focal abnormality. Adrenals/Urinary Tract: Adrenal glands are unremarkable. Kidneys are normal, without renal calculi, focal lesion, or hydronephrosis. Bladder is unremarkable. Stomach/Bowel: Colectomy with right lower quadrant ileostomy and sigmoid stump. Peristomal hernia containing fat and ileum without evidence of proximal obstruction. Mild wall thickening with fluid-filled distal ileum may indicate ileitis. Small bowel are mostly decompressed. Stomach and rectosigmoid stump are unremarkable. Vascular/Lymphatic: No  significant vascular findings are present. No enlarged abdominal or pelvic lymph nodes. Reproductive: Prostate is unremarkable. Other: No free air or free fluid in the abdomen. Musculoskeletal: No acute or significant osseous findings. IMPRESSION: 1. Prior colectomy with right lower quadrant ileocolostomy and sigmoid stump. 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 2. Mild wall thickening and fluid-filled terminal ileum extending into the ileostomy may indicate ileitis. 3. Rectosigmoid stump appears intact and decompressed. Electronically Signed   By: Lucienne Capers M.D.   On: 06/04/2022 03:07    Principal Problem:   Parastomal hernia Active Problems:   Crohn's proctitis ([place])   Peristomal hernia    Lajuan Lines. Hilarie Fredrickson, M.D. @  06/04/2022, 4:57 PM

## 2022-06-04 NOTE — Hospital Course (Addendum)
Principal Problem:   Parastomal hernia Active Problems:   Peristomal hernia   Ileostomy in place   Crohn's disease of both small and large intestine with complication  Resolved Problems:   * No resolved hospital problems. *  Parastomal hernia Patient presented with a a week of worsening abdominal pain and non-reducible hernia.  He was hemodynamically stable on admission.  CT abdomen pelvis showed parastomal hernia containing fat and small bowel without evidence of proximal obstruction as well as mild wall thickening and fluid-filled terminal ileum.  Surgery was consulted for surgical management.  With CT findings of possible ileitis GI was also consulted.  They did not think current episode was consistent with Crohn's flare and recommended surgical management.  Mr. Vanwie underwent ileostomy revision and repair of parastomal hernia on 3/31.  He will follow-up with general surgery on 5/1 with Dr. [name].   Crohn's disease complicated by ileal strictures status post total abdominal colectomy with end ileostomy in 2021 Mr. Kenon was diagnosed with Crohn's in 2005.  He received most of his [facility] at [facility], but recently relocated back to [place].  He was seen by [facility] during admission and will follow-up with them in July 2024.  His Crohn's has been well-managed on Stelara every 4 weeks.  He has 3 pens remaining at home.  Dr. Hilarie Fredrickson prescribed additional Stelara.  Patient's ultimate goal is for ileostomy reversal.  Right hip pain With chronic and intermittent right hip pain.  This has previously been evaluated at [facility], although uncertain of prior imaging studies.  Differentials include inflammatory arthritis secondary to Crohn's.  With his history of chronic steroid use, avascular necrosis could also be a possibility.

## 2022-06-04 NOTE — ED Notes (Signed)
Pt given ordered pain meds. Lights turned out per pt request, door closed.

## 2022-06-04 NOTE — ED Notes (Signed)
Pt is a&ox4, pwd. Pt is pleasant and complains of 7/10 pain to ostomy site. Pt also sounds congested and states that his allergies are bothering him. No other complaints noted or reported. Side rails up, call light with patient. Monitor/vitals.

## 2022-06-04 NOTE — ED Notes (Signed)
Pt resting comfortably on stretcher NAD will continue to monitor

## 2022-06-04 NOTE — H&P (Signed)
Date: 06/04/2022               Patient Name:  Troy Ramirez MRN: [id number]  DOB: [birth year] Age / Sex: 37 y.o., male   PCP: Pcp, No         Medical Service: Internal Medicine Teaching Service         Attending Physician: Dr. Lucious Groves, DO    First Contact: Dr. Angelique Blonder  Pager: [phone]  Second Contact: Dr. Christiana Fuchs  Pager: [phone]       After Hours (After 5p/  First Contact Pager: [phone]  weekends / holidays): Second Contact Pager: [phone]   Chief Complaint: worsening parastomal hernia   History of Present Illness:  Troy Ramirez is a 37 yo M with a PMH of HTN, obesity, and crohn's disease c/b multiple strictures s/p total abdominal colectomy with end ileostomy.   Patient states that beginning about a year ago he noticed a bulge around his hernia. He states that he was able to reduce the bugle and continued to have good output from his ostomy bag. Since he first noticed the bulge, it has gotten progressively more noticeable and more painful with standing/movement.    Starting last week patient reports that he started sneezing and coughing due to seasonal allergies and when the hernia would bulge, it felt harder and it was challenging to reduce. He also had increased pain.   Patient states that he would feel nauseous occasionally and noticed intermittent increases in his ostomy output. Patient felt that he was being intermittently obstructed. He had a small amount of blood in the ostomy appliance 2 d ago but none since then.   He presented to the ED because of increased ostomy output and pain.   He denies fevers, but notes some chills, denies chest pain or SOB, denies dysuria.   Patient moved to [place] 10/2021. He has not been able to establish with GI here. He is currently on stelara for his crohn's.   PMH:  HTN used to take medicine but made him swell, using exercising amlodipine stopped in 2023 early  Stelaro once a month injections Humira worked well but  eventualy become allergic. Doing good with stelaro. Third or fourth biologic he has been on.  Takes magnesium last year, mothers nature once a day  PSH:  TAC w/ EI  FH:  Mom HTN Dad GBS 2011, HTN   Twin brother cerebral palsy, crohns dz, HTN  SH:  Used to smoke in 2000s, quit in 2018  Drinks rarely, one day out of month. Drinks a "handle" of alcohol Has been to rehab for alcohol.  Meds:  No current facility-administered medications on file prior to encounter.   Current Outpatient Medications on File Prior to Encounter  Medication Sig Dispense Refill   acetaminophen (TYLENOL) 500 MG tablet Take 1,000 mg by mouth daily as needed for moderate pain, fever or headache.     MAGNESIUM OXIDE PO Take 1 tablet by mouth daily.     ustekinumab (STELARA) 90 MG/ML SOSY injection Inject 90 mg into the skin every 28 (twenty-eight) days.     pantoprazole (PROTONIX) 40 MG tablet Take 1 tablet (40 mg total) by mouth 2 (two) times daily before a meal. Reported on 05/19/2015 (Patient not taking: Reported on 06/04/2022) 30 tablet 5    Current Meds  Medication Sig   acetaminophen (TYLENOL) 500 MG tablet Take 1,000 mg by mouth daily as needed for moderate pain, fever or headache.  MAGNESIUM OXIDE PO Take 1 tablet by mouth daily.   ustekinumab (STELARA) 90 MG/ML SOSY injection Inject 90 mg into the skin every 28 (twenty-eight) days.     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    Crohn disease ([place])    Hypertension    Internal hemorrhoids     Family History:  Per above   Social History:  Per above   Review of Systems: A complete ROS was negative except as per HPI.    Physical Exam: Blood  pressure (!) 149/91, pulse 85, temperature 98.4 F (36.9 C), resp. rate 20, SpO2 100 %. Constitutional: Well-developed, well-nourished, and in no distress.  HENT:  Head: Normocephalic and atraumatic.  Eyes: EOM are normal.  Neck: Normal range of motion.  Cardiovascular: Normal rate, regular rhythm, intact distal pulses. No gallop and no friction rub.  No murmur heard. No lower extremity edema  Pulmonary: Non labored breathing on room air, no wheezing or rales  Abdominal: Soft. Normal bowel sounds. Parastomal hernia overlying skin non erythematous, hernia size increased with standing. Pictures in media tab. Soft brown stool in bag.  Musculoskeletal: Normal range of motion.        General: No tenderness or edema.  Neurological: Alert and oriented to person, place, and time. Non focal  Skin: Skin is warm and dry.    CT ABDOMEN PELVIS W CONTRAST  Result Date: 06/04/2022 CLINICAL DATA:  Acute nonlocalized abdominal pain. Lower abdominal pain with diarrhea and nausea for several days. EXAM: CT ABDOMEN AND PELVIS WITH CONTRAST TECHNIQUE: Multidetector CT imaging of the abdomen and pelvis was performed using the standard protocol following bolus administration of intravenous contrast. RADIATION DOSE REDUCTION: This exam was performed according to the departmental dose-optimization program which includes automated exposure control, adjustment of the mA and/or kV according to patient size and/or use of iterative reconstruction technique. CONTRAST:  128mL OMNIPAQUE IOHEXOL 350 MG/ML SOLN COMPARISON:  07/09/2015 FINDINGS: Lower chest: Lung bases are clear. Hepatobiliary: No focal liver abnormality is seen. No gallstones, gallbladder wall thickening, or biliary dilatation. Pancreas: Unremarkable. No pancreatic ductal dilatation or surrounding inflammatory changes. Spleen: Normal in size without focal abnormality. Adrenals/Urinary Tract: Adrenal glands are unremarkable. Kidneys are normal, without renal  calculi, focal lesion, or hydronephrosis. Bladder is unremarkable. Stomach/Bowel: Colectomy with right lower quadrant ileostomy and sigmoid stump. Peristomal hernia containing fat and ileum without evidence of proximal obstruction. Mild wall thickening with fluid-filled distal ileum may indicate ileitis. Small bowel are mostly decompressed. Stomach and rectosigmoid stump are unremarkable. Vascular/Lymphatic: No significant vascular findings are present. No enlarged abdominal or pelvic lymph nodes. Reproductive: Prostate is unremarkable. Other: No free air or free fluid in the abdomen. Musculoskeletal: No acute or significant osseous findings. IMPRESSION: 1. Prior colectomy with right lower quadrant ileocolostomy and sigmoid stump. 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 2. Mild wall thickening and fluid-filled terminal ileum extending into the ileostomy may indicate ileitis. 3. Rectosigmoid stump appears intact and decompressed. Electronically Signed   By: Lucienne Capers M.D.   On: 06/04/2022 03:07     Assessment & Plan by Problem: Principal Problem:   Parastomal hernia Active Problems:  Crohn's proctitis ([place])  #Parastomal hernia  Patient has a parastomal that has worsened. He has been evaluated by gen surgery.  -Plan for NPO and IVF -GI consulted by gen surgery   Crohn's s/p TAC w/ EI  IVF per above, patient will continue outpatient Stelara injections  #H/o HTN  Mildly elevated this hospitalization. Previously on amlodipine.  -Will need PCP, and to address this at that time  Dispo: Admit patient to Observation with expected length of stay less than 2 midnights.  Signed: Rick Duff, MD 06/04/2022, 1:44 PM  After 5pm on weekdays and 1pm on weekends: On Call pager: [phone]

## 2022-06-04 NOTE — Progress Notes (Signed)
Received patient from ED.  Patient is alert and oriented x 4, ambulatory.  Noted with RLQ colostomy, draining with brown stool.  Parastomal hernia bulge noted when patient standing.  C/o pain 7/10, achy pain.  Educated on care plan per MD note, verbalized understanding.  Oriented to room and unit routine, cell bell within reach.  Needs addressed.

## 2022-06-04 NOTE — Consult Note (Signed)
Troy Ramirez 09-18-1985  [id number].    Requesting MD: Dr. Merrily Pew Chief Complaint/Reason for Consult: Parastomal hernia  HPI:  Mr. Troy Ramirez is a 37 yo male with a history of Crohn's disease, for which he underwent a total abdominal colectomy with end ileostomy in July 2021 at [facility] in [place]. He reports that he gained a significant amount of weight following his surgery (>100 lbs) and over the last few months has intentionally lost about 60 pounds. As he has lost weight, he has noticed a large bulge around his ileostomy. This has been present for several months, but until a few days ago was non-painful and easily reducible. About 4 days ago, he began having severe pain around the stoma, especially with Troy Ramirez, and says the area feels rock hard. He is no longer able to completely reduce the hernia. The pain improves when he lies down, but as soon as he stands he has severe pain again. He presented to the ED. Labs show a slight increase in BUN/Cr. He reports he empties his ileostomy very frequently, often every 1-2 hours. He has not eaten for the last 12 hours so the output has decreased. 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 General surgery was consulted.  He is currently on Stelara monthly and saw GI at [facility] in July 2022, however he says he has not established care with a local gastroenterologist since moving to [place]. He gets Stelara monthly and is due for his next dose in one week. He says he had previously discussed ileostomy takedown with his GI team in [place] but he wanted to lose weight prior to surgery.  ROS: Review of Systems  Constitutional:  Negative for chills and fever.  Respiratory:  Negative for shortness of breath, wheezing and stridor.   Cardiovascular:  Negative for chest pain.  Gastrointestinal:  Positive for abdominal pain. Negative for constipation.  Neurological:  Negative for seizures.    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    Crohn disease ([place])    Hypertension    Internal hemorrhoids     Past Surgical History:  Procedure Laterality Date   neg hx      Social History:  reports that he has never smoked. He has never used smokeless tobacco. He reports current alcohol use. He reports current drug use. Drug: Marijuana.  Allergies:  Allergies  Allergen Reactions   Remicade [Infliximab] Anaphylaxis and Swelling    (Not in a hospital admission)    Physical Exam: Blood pressure 135/88, pulse 92, temperature 97.9 F (36.6 C), temperature source Oral, resp. rate 15, SpO2 100 %. General: resting comfortably, appears stated age, no apparent distress Neurological: alert and oriented, no focal deficits HEENT: normocephalic, atraumatic, oropharynx clear, no scleral icterus CV: regular rate and rhythm, extremities warm and well-perfused Respiratory: normal  work of breathing on room air Abdomen: soft, nondistended, well-healed port site surgical scars. Ileostomy is pink and productive of loose stool. Peristomal hernia is soft but nonreducible and tender to palpation. Extremities: warm and well-perfused, no deformities, moving all extremities spontaneously Psychiatric: normal mood and affect Skin: warm and dry, no jaundice, no rashes or lesions   Results for orders placed or performed during the hospital encounter of 06/03/22 (from the past 48 hour(s))  Urinalysis, Routine w reflex microscopic -Urine, Clean Catch      Status: Abnormal   Collection Time: 06/03/22  7:43 PM  Result Value Ref Range   Color, Urine AMBER (A) YELLOW    Comment: BIOCHEMICALS MAY BE AFFECTED BY COLOR   APPearance HAZY (A) CLEAR   Specific Gravity, Urine 1.020 1.005 - 1.030   pH 5.0 5.0 - 8.0   Glucose, UA NEGATIVE NEGATIVE mg/dL   Hgb urine dipstick NEGATIVE NEGATIVE   Bilirubin Urine NEGATIVE NEGATIVE   Ketones, ur NEGATIVE NEGATIVE mg/dL   Protein, ur NEGATIVE NEGATIVE mg/dL   Nitrite NEGATIVE NEGATIVE   Leukocytes,Ua NEGATIVE NEGATIVE    Comment: Performed at [facility], [address] [place], [place] [address]  Lipase, blood     Status: None   Collection Time: 06/03/22  7:46 PM  Result Value Ref Range   Lipase 33 11 - 51 U/L    Comment: Performed at [facility] [address], [place], [place] [address]  Comprehensive metabolic panel     Status: Abnormal   Collection Time: 06/03/22  7:46 PM  Result Value Ref Range   Sodium 134 (L) 135 - 145 mmol/L   Potassium 4.1 3.5 - 5.1 mmol/L   Chloride 97 (L) 98 - 111 mmol/L   CO2 23 22 - 32 mmol/L   Glucose, Bld 109 (H) 70 - 99 mg/dL    Comment: Glucose reference range applies only to samples taken after fasting for at least 8 hours.   BUN 24 (H) 6 - 20 mg/dL   Creatinine, Ser 1.19 0.61 - 1.24 mg/dL   Calcium 9.7 8.9 - 10.3 mg/dL   Total Protein 9.4 (H) 6.5 - 8.1 g/dL   Albumin 4.3 3.5 - 5.0 g/dL   AST 39 15 - 41 U/L   ALT 48 (H) 0 - 44 U/L   Alkaline Phosphatase 61 38 - 126 U/L   Total Bilirubin 1.0 0.3 - 1.2 mg/dL   GFR, Estimated >60 >60 mL/min    Comment: (NOTE) Calculated using the CKD-EPI Creatinine Equation (2021)    Anion gap 14 5 - 15    Comment: Performed at [facility], [place] [address], [place], [place] [address]  CBC     Status: Abnormal   Collection Time: 06/03/22  7:46 PM  Result Value Ref Range   WBC 12.1 (H) 4.0 - 10.5 K/uL   RBC 5.70 4.22 - 5.81 MIL/uL   Hemoglobin 15.7 13.0 - 17.0 g/dL   HCT 47.4 39.0 - 52.0 %   MCV  83.2 80.0 - 100.0 fL   MCH 27.5 26.0 - 34.0 pg   MCHC 33.1 30.0 - 36.0 g/dL   RDW 14.9 11.5 - 15.5 %   Platelets 426 (H) 150 - 400 K/uL   nRBC 0.0 0.0 - 0.2 %    Comment: Performed at [facility], [place] [address] [place], [place] [address]   CT ABDOMEN PELVIS W CONTRAST  Result Date: 06/04/2022 CLINICAL DATA:  Acute nonlocalized abdominal  pain. Lower abdominal pain with diarrhea and nausea for several days. EXAM: CT ABDOMEN AND PELVIS WITH CONTRAST TECHNIQUE: Multidetector CT imaging of the abdomen and pelvis was performed using the standard protocol following bolus administration of intravenous contrast. RADIATION DOSE REDUCTION: This exam was performed according to the departmental dose-optimization program which includes automated exposure control, adjustment of the mA and/or kV according to patient size and/or use of iterative reconstruction technique. CONTRAST:  159mL OMNIPAQUE IOHEXOL 350 MG/ML SOLN COMPARISON:  07/09/2015 FINDINGS: Lower chest: Lung bases are clear. Hepatobiliary: No focal liver abnormality is seen. No gallstones, gallbladder wall thickening, or biliary dilatation. Pancreas: Unremarkable. No pancreatic ductal dilatation or surrounding inflammatory changes. Spleen: Normal in size without focal abnormality. Adrenals/Urinary Tract: Adrenal glands are unremarkable. Kidneys are normal, without renal calculi, focal lesion, or hydronephrosis. Bladder is unremarkable. Stomach/Bowel: Colectomy with right lower quadrant ileostomy and sigmoid stump. Peristomal hernia containing fat and ileum without evidence of proximal obstruction. Mild wall thickening with fluid-filled distal ileum may indicate ileitis. Small bowel are mostly decompressed. Stomach and rectosigmoid stump are unremarkable. Vascular/Lymphatic: No significant vascular findings are present. No enlarged abdominal or pelvic lymph nodes. Reproductive: Prostate is unremarkable. Other: No free air or free fluid in the abdomen.  Musculoskeletal: No acute or significant osseous findings. IMPRESSION: 1. Prior colectomy with right lower quadrant ileocolostomy and sigmoid stump. 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 2. Mild wall thickening and fluid-filled terminal ileum extending into the ileostomy may indicate ileitis. 3. Rectosigmoid stump appears intact and decompressed. Electronically Signed   By: Lucienne Capers M.D.   On: 06/04/2022 03:07      Assessment/Plan 37 yo male with a history of TAC with end ileostomy for Crohn's disease, now with a symptomatic, nonreducible parastomal hernia containing small bowel. He does not have signs of obstruction or incarceration, but given the severity of his symptoms and ongoing pain I think it is unlikely he would be able to wait for outpatient follow up.  - NPO for now, IV fluid hydration - Pain and nausea control - Discussed with colorectal surgeon. Request [facility] admission and will consult GI to establish care for Crohn's. Will establish a final surgical plan for parastomal hernia repair pending GI evaluation.   Michaelle Birks, MD [facility] Surgery General, Hepatobiliary and Pancreatic Surgery 06/04/22 6:45 AM

## 2022-06-04 NOTE — Plan of Care (Signed)

## 2022-06-04 NOTE — ED Notes (Signed)
ED TO INPATIENT HANDOFF REPORT  ED Nurse Name and Phone #: Waynetta Sandy [phone]  S Name/Age/Gender [name] 37 y.o. male Room/Bed: 040C/040C  Code Status   Code Status: Full Code  Home/SNF/Other Home Patient oriented to: self, place, time, and situation Is this baseline? Yes   Triage Complete: Triage complete  Chief Complaint Peristomal hernia [K46.9]  Triage Note Patient reports persistent pain across lower abdomen with diarrhea and nausea for several days .    Allergies Allergies  Allergen Reactions   Remicade [Infliximab] Anaphylaxis and Swelling    Level of Care/Admitting Diagnosis ED Disposition     ED Disposition  Admit   Condition  --   Comment  Hospital Area: [place] [[id number]]  Level of Care: Med-Surg [16]  May place patient in observation at [facility] or [place] if equivalent level of care is available:: Yes  Covid Evaluation: Asymptomatic - no recent exposure (last 10 days) testing not required  Diagnosis: Peristomal hernia [id number]  Admitting Physician: [place], [place]  Attending Physician: Bosie Helper          B Medical/Surgery History Past Medical History:  Diagnosis Date   Anemia    required transfusion of bood in 2008 or 2009   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    Crohn disease ([place])    Hypertension    Internal hemorrhoids    Past Surgical History:  Procedure Laterality Date   neg hx       A IV Location/Drains/Wounds Patient Lines/Drains/Airways Status     Active Line/Drains/Airways     Name Placement date Placement time Site Days   Peripheral IV 06/04/22 20 G Right Antecubital 06/04/22  0033  Antecubital  less than 1            Intake/Output Last 24 hours  Intake/Output Summary (Last 24 hours) at 06/04/2022 1049 Last data filed at 06/04/2022 [id number] Gross per 24 hour  Intake 1998 ml  Output --  Net 1998 ml     Labs/Imaging Results for orders placed or performed during the hospital encounter of 06/03/22 (from the past 48 hour(s))  Urinalysis, Routine w reflex microscopic -Urine, Clean Catch     Status: Abnormal   Collection Time: 06/03/22  7:43 PM  Result Value Ref Range   Color, Urine AMBER (A) YELLOW    Comment: BIOCHEMICALS MAY BE AFFECTED BY COLOR   APPearance HAZY (A) CLEAR   Specific Gravity, Urine 1.020 1.005 - 1.030   pH 5.0 5.0 - 8.0   Glucose, UA NEGATIVE NEGATIVE mg/dL   Hgb urine dipstick NEGATIVE NEGATIVE   Bilirubin Urine NEGATIVE NEGATIVE   Ketones, ur NEGATIVE NEGATIVE mg/dL   Protein, ur NEGATIVE NEGATIVE mg/dL   Nitrite NEGATIVE NEGATIVE   Leukocytes,Ua NEGATIVE NEGATIVE    Comment: Performed at [facility], [address] [place], [place] [address]  Lipase, blood     Status: None   Collection Time: 06/03/22  7:46 PM  Result Value Ref Range   Lipase 33 11 - 51 U/L    Comment: Performed at [facility] [address] [place] [address] [place], [place] [address]  Comprehensive metabolic panel     Status: Abnormal   Collection Time: 06/03/22  7:46 PM  Result Value Ref Range   Sodium 134 (L) 135 - 145 mmol/L   Potassium 4.1 3.5 - 5.1 mmol/L   Chloride 97 (  L) 98 - 111 mmol/L   CO2 23 22 - 32 mmol/L   Glucose, Bld 109 (H) 70 - 99 mg/dL    Comment: Glucose reference range applies only to samples taken after fasting for at least 8 hours.   BUN 24 (H) 6 - 20 mg/dL   Creatinine, Ser 1.19 0.61 - 1.24 mg/dL   Calcium 9.7 8.9 - 10.3 mg/dL   Total Protein 9.4 (H) 6.5 - 8.1 g/dL   Albumin 4.3 3.5 - 5.0 g/dL   AST 39 15 - 41 U/L   ALT 48 (H) 0 - 44 U/L   Alkaline Phosphatase 61 38 - 126 U/L   Total Bilirubin 1.0 0.3 - 1.2 mg/dL   GFR, Estimated >60 >60 mL/min    Comment: (NOTE) Calculated using the CKD-EPI Creatinine Equation (2021)    Anion gap 14 5 - 15    Comment: Performed at [facility], [place] [address], [place], [place] [address]  CBC     Status: Abnormal    Collection Time: 06/03/22  7:46 PM  Result Value Ref Range   WBC 12.1 (H) 4.0 - 10.5 K/uL   RBC 5.70 4.22 - 5.81 MIL/uL   Hemoglobin 15.7 13.0 - 17.0 g/dL   HCT 47.4 39.0 - 52.0 %   MCV 83.2 80.0 - 100.0 fL   MCH 27.5 26.0 - 34.0 pg   MCHC 33.1 30.0 - 36.0 g/dL   RDW 14.9 11.5 - 15.5 %   Platelets 426 (H) 150 - 400 K/uL   nRBC 0.0 0.0 - 0.2 %    Comment: Performed at [facility], [place] [address], [place],  [address]   CT ABDOMEN PELVIS W CONTRAST  Result Date: 06/04/2022 CLINICAL DATA:  Acute nonlocalized abdominal pain. Lower abdominal pain with diarrhea and nausea for several days. EXAM: CT ABDOMEN AND PELVIS WITH CONTRAST TECHNIQUE: Multidetector CT imaging of the abdomen and pelvis was performed using the standard protocol following bolus administration of intravenous contrast. RADIATION DOSE REDUCTION: This exam was performed according to the departmental dose-optimization program which includes automated exposure control, adjustment of the mA and/or kV according to patient size and/or use of iterative reconstruction technique. CONTRAST:  131mL OMNIPAQUE IOHEXOL 350 MG/ML SOLN COMPARISON:  07/09/2015 FINDINGS: Lower chest: Lung bases are clear. Hepatobiliary: No focal liver abnormality is seen. No gallstones, gallbladder wall thickening, or biliary dilatation. Pancreas: Unremarkable. No pancreatic ductal dilatation or surrounding inflammatory changes. Spleen: Normal in size without focal abnormality. Adrenals/Urinary Tract: Adrenal glands are unremarkable. Kidneys are normal, without renal calculi, focal lesion, or hydronephrosis. Bladder is unremarkable. Stomach/Bowel: Colectomy with right lower quadrant ileostomy and sigmoid stump. Peristomal hernia containing fat and ileum without evidence of proximal obstruction. Mild wall thickening with fluid-filled distal ileum may indicate ileitis. Small bowel are mostly decompressed. Stomach and rectosigmoid stump are unremarkable.  Vascular/Lymphatic: No significant vascular findings are present. No enlarged abdominal or pelvic lymph nodes. Reproductive: Prostate is unremarkable. Other: No free air or free fluid in the abdomen. Musculoskeletal: No acute or significant osseous findings. IMPRESSION: 1. Prior colectomy with right lower quadrant ileocolostomy and sigmoid stump. 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 2. Mild wall thickening and fluid-filled terminal ileum extending into the ileostomy may indicate ileitis. 3. Rectosigmoid stump appears intact and decompressed. Electronically Signed   By: Lucienne Capers M.D.   On: 06/04/2022 03:07    Pending Labs [employer] (From admission, onward)     Start  Ordered   06/05/22 0500  CBC  Tomorrow morning,   R        06/04/22 0813   06/05/22 [id number]  Basic metabolic panel  Tomorrow morning,   R        06/04/22 0813   06/04/22 0949  Magnesium  Once,   R        06/04/22 0949   06/04/22 0949  Phosphorus  Once,   R        06/04/22 0949   06/04/22 0949  TSH  Once,   R        06/04/22 0949   06/04/22 0948  HIV Antibody (routine testing w rflx)  (HIV Antibody (Routine testing w reflex) panel)  Once,   R        06/04/22 0949            Vitals/Pain Today's Vitals   06/04/22 0648 06/04/22 0700 06/04/22 0715 06/04/22 0752  BP:  139/85    Pulse:  94    Resp:  16    Temp:   98 F (36.7 C)   TempSrc:   Oral   SpO2:  100%    PainSc: 7    4     Isolation Precautions No active isolations  Medications Medications  lactated ringers infusion ( Intravenous New Bag/Given 06/04/22 0659)  lactated ringers bolus 1,000 mL (has no administration in time range)  HYDROmorphone (DILAUDID) injection 0.5-2 mg (2 mg Intravenous Given 06/04/22 0923)  acetaminophen (TYLENOL) tablet 325-650 mg (has no administration in time range)  acetaminophen (TYLENOL) suppository 650 mg (has no administration in time range)  methocarbamol  (ROBAXIN) 1,000 mg in dextrose 5 % 100 mL IVPB (has no administration in time range)  methocarbamol (ROBAXIN) tablet 1,000 mg (has no administration in time range)  oxyCODONE (Oxy IR/ROXICODONE) immediate release tablet 5-10 mg (has no administration in time range)  lip balm (CARMEX) ointment ( Topical Given 06/04/22 0907)  phenol (CHLORASEPTIC) mouth spray 2 spray (has no administration in time range)  menthol-cetylpyridinium (CEPACOL) lozenge 3 mg (has no administration in time range)  ondansetron (ZOFRAN) injection 4 mg (has no administration in time range)    Or  ondansetron (ZOFRAN) 8 mg in sodium chloride 0.9 % 50 mL IVPB (has no administration in time range)  prochlorperazine (COMPAZINE) injection 5-10 mg (has no administration in time range)  magic mouthwash (has no administration in time range)  alum & mag hydroxide-simeth (MAALOX/MYLANTA) 200-200-20 MG/5ML suspension 30 mL (has no administration in time range)  diphenhydrAMINE (BENADRYL) injection 12.5-25 mg (has no administration in time range)  metoprolol tartrate (LOPRESSOR) injection 5 mg (has no administration in time range)  heparin injection 5,000 Units (has no administration in time range)  sodium chloride (OCEAN) 0.65 % nasal spray 1 spray (has no administration in time range)  fluticasone (FLONASE) 50 MCG/ACT nasal spray 1 spray (has no administration in time range)  oxyCODONE-acetaminophen (PERCOCET/ROXICET) 5-325 MG per tablet 1 tablet (1 tablet Oral Given 06/03/22 1945)  HYDROmorphone (DILAUDID) injection 1 mg (1 mg Intravenous Given 06/04/22 0046)  ondansetron (ZOFRAN) injection 4 mg (4 mg Intravenous Given 06/04/22 0044)  lactated ringers bolus 1,000 mL (0 mLs Intravenous Stopped 06/04/22 0145)  iohexol (OMNIPAQUE) 350 MG/ML injection 100 mL (100 mLs Intravenous Contrast Given 06/04/22 0243)  lactated ringers bolus 1,000 mL (0 mLs Intravenous Stopped 06/04/22 0524)  HYDROmorphone (DILAUDID) injection 1 mg (1 mg Intravenous  Given 06/04/22 0724)  lactated ringers bolus 1,000 mL (1,000 mLs Intravenous New  Bag/Given 06/04/22 0817)    Mobility walks     Focused Assessments Abdominal pain dx of crohns with colostomy   R Recommendations: See Admitting Provider Note  Report given to:   Additional Notes: .

## 2022-06-04 NOTE — ED Provider Notes (Signed)
[facility] Provider Note   CSN: [id number] Arrival date & time: 06/03/22  1934     History  Chief Complaint  Patient presents with   Abdominal Pain    Chron's Flare Up    Troy Ramirez is a 37 y.o. male.  37 yo M w/ h/o crohns s/p ileostomy on stelara here with intermittent abdominal pain, bulging around his stoma. States it started with a coughing episode. Thinks it's a hernia but has had strictures/fistulas in the past so wants to make sure it is not that. States he will also go for periods of time without any output and the pain will just build up and the swelling will get worse. Then will have a large amount of output all at once. Urine is dark and malodorous at times despite best attempts at hydration.    Abdominal Pain      Home Medications Prior to Admission medications   Medication Sig Start Date End Date Taking? Authorizing Provider  acetaminophen (TYLENOL) 325 MG tablet Take 650 mg by mouth daily as needed for moderate pain or headache.    [provider]  Adalimumab (HUMIRA) 40 MG/0.8ML PSKT Inject 40 mg into the skin once a week.     [provider]  albuterol (PROVENTIL HFA;VENTOLIN HFA) 108 (90 Base) MCG/ACT inhaler Inhale 1-2 puffs into the lungs every 6 (six) hours as needed for wheezing or shortness of breath. 09/18/15   Palumbo, April, MD  amLODipine (NORVASC) 5 MG tablet Take 2 tablets (10 mg total) by mouth daily. 07/12/15   Debbe Odea, MD  calcium carbonate (TUMS EX) 750 MG chewable tablet Chew 2-3 tablets by mouth 2 (two) times daily as needed for heartburn.    [provider]  doxycycline (VIBRAMYCIN) 100 MG capsule Take 1 capsule (100 mg total) by mouth 2 (two) times daily. One po bid x 7 days 09/18/15   Palumbo, April, MD  ferrous sulfate 325 (65 FE) MG tablet Take 1 tablet (325 mg total) by mouth 2 (two) times daily with a meal. 07/12/15   Debbe Odea, MD  hydrOXYzine (ATARAX/VISTARIL)  50 MG tablet Take 25-50 mg by mouth at bedtime as needed for anxiety.    [provider]  ibuprofen (ADVIL,MOTRIN) 200 MG tablet Take 200 mg by mouth daily as needed for moderate pain.    [provider]  methocarbamol (ROBAXIN) 500 MG tablet Take 1 tablet (500 mg total) by mouth 2 (two) times daily. 09/18/15   Palumbo, April, MD  oxyCODONE (OXY IR/ROXICODONE) 5 MG immediate release tablet Take 1 tablet (5 mg total) by mouth every 6 (six) hours as needed for moderate pain. 07/12/15   Debbe Odea, MD  pantoprazole (PROTONIX) 40 MG tablet Take 1 tablet (40 mg total) by mouth 2 (two) times daily before a meal. Reported on 05/19/2015 07/12/15   Debbe Odea, MD  predniSONE (DELTASONE) 20 MG tablet Take 2 tablets (40 mg total) by mouth daily with breakfast. 07/12/15   Debbe Odea, MD  simethicone (MYLICON) 0000000 MG chewable tablet Chew 1 tablet (125 mg total) by mouth every 6 (six) hours as needed for flatulence. Reported on 05/19/2015 05/19/15   Boykin Nearing, MD  sulfaSALAzine (AZULFIDINE) 500 MG tablet Take 2 tablets (1,000 mg total) by mouth 2 (two) times daily. Patient taking differently: Take 2,000 mg by mouth 2 (two) times daily.  05/19/15   Boykin Nearing, MD      Allergies    Remicade [infliximab]  Review of Systems   Review of Systems  Gastrointestinal:  Positive for abdominal pain.    Physical Exam Updated Vital Signs BP 126/79   Pulse 93   Temp 97.9 F (36.6 C) (Oral)   Resp 15   SpO2 99%  Physical Exam Vitals and nursing note reviewed.  Constitutional:      Appearance: He is well-developed.  HENT:     Head: Normocephalic and atraumatic.  Cardiovascular:     Rate and Rhythm: Tachycardia present.  Pulmonary:     Effort: Pulmonary effort is normal. No respiratory distress.  Abdominal:     General: There is no distension.     Tenderness: There is abdominal tenderness.     Hernia: A hernia is present.     Comments: Peristomal hernia with ttp. No erythema.  Not tight.   Musculoskeletal:        General: Normal range of motion.     Cervical back: Normal range of motion.  Neurological:     Mental Status: He is alert.     ED Results / Procedures / Treatments   Labs (all labs ordered are listed, but only abnormal results are displayed) Labs Reviewed  COMPREHENSIVE METABOLIC PANEL - Abnormal; Notable for the following components:      Result Value   Sodium 134 (*)    Chloride 97 (*)    Glucose, Bld 109 (*)    BUN 24 (*)    Total Protein 9.4 (*)    ALT 48 (*)    All other components within normal limits  CBC - Abnormal; Notable for the following components:   WBC 12.1 (*)    Platelets 426 (*)    All other components within normal limits  URINALYSIS, ROUTINE W REFLEX MICROSCOPIC - Abnormal; Notable for the following components:   Color, Urine AMBER (*)    APPearance HAZY (*)    All other components within normal limits  LIPASE, BLOOD    EKG None  Radiology CT ABDOMEN PELVIS W CONTRAST  Result Date: 06/04/2022 CLINICAL DATA:  Acute nonlocalized abdominal pain. Lower abdominal pain with diarrhea and nausea for several days. EXAM: CT ABDOMEN AND PELVIS WITH CONTRAST TECHNIQUE: Multidetector CT imaging of the abdomen and pelvis was performed using the standard protocol following bolus administration of intravenous contrast. RADIATION DOSE REDUCTION: This exam was performed according to the departmental dose-optimization program which includes automated exposure control, adjustment of the mA and/or kV according to patient size and/or use of iterative reconstruction technique. CONTRAST:  171mL OMNIPAQUE IOHEXOL 350 MG/ML SOLN COMPARISON:  07/09/2015 FINDINGS: Lower chest: Lung bases are clear. Hepatobiliary: No focal liver abnormality is seen. No gallstones, gallbladder wall thickening, or biliary dilatation. Pancreas: Unremarkable. No pancreatic ductal dilatation or surrounding inflammatory changes. Spleen: Normal in size without focal  abnormality. Adrenals/Urinary Tract: Adrenal glands are unremarkable. Kidneys are normal, without renal calculi, focal lesion, or hydronephrosis. Bladder is unremarkable. Stomach/Bowel: Colectomy with right lower quadrant ileostomy and sigmoid stump. Peristomal hernia containing fat and ileum without evidence of proximal obstruction. Mild wall thickening with fluid-filled distal ileum may indicate ileitis. Small bowel are mostly decompressed. Stomach and rectosigmoid stump are unremarkable. Vascular/Lymphatic: No significant vascular findings are present. No enlarged abdominal or pelvic lymph nodes. Reproductive: Prostate is unremarkable. Other: No free air or free fluid in the abdomen. Musculoskeletal: No acute or significant osseous findings. IMPRESSION: 1. Prior colectomy with right lower quadrant ileocolostomy and sigmoid stump. 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  2. Mild wall thickening and fluid-filled terminal ileum extending into the ileostomy may indicate ileitis. 3. Rectosigmoid stump appears intact and decompressed. Electronically Signed   By: Lucienne Capers M.D.   On: 06/04/2022 03:07    Procedures Procedures    Medications Ordered in ED Medications  oxyCODONE-acetaminophen (PERCOCET/ROXICET) 5-325 MG per tablet 1 tablet (1 tablet Oral Given 06/03/22 1945)  HYDROmorphone (DILAUDID) injection 1 mg (1 mg Intravenous Given 06/04/22 0046)  ondansetron (ZOFRAN) injection 4 mg (4 mg Intravenous Given 06/04/22 0044)  lactated ringers bolus 1,000 mL (0 mLs Intravenous Stopped 06/04/22 0145)  iohexol (OMNIPAQUE) 350 MG/ML injection 100 mL (100 mLs Intravenous Contrast Given 06/04/22 0243)  lactated ringers bolus 1,000 mL (1,000 mLs Intravenous New Bag/Given 06/04/22 0424)    ED Course/ Medical Decision Making/ A&P                             Medical Decision Making Amount and/or Complexity of Data Reviewed Labs: ordered. Radiology:  ordered.  Risk Prescription drug management. Decision regarding hospitalization.  Eval for complications of crohns vs intermittent obstruction. Symptomatic treatment and fluids in meantime.  Discussed with EGS, Dr. [name], will plan for admission and revision. NPO for now.     Final Clinical Impression(s) / ED Diagnoses Final diagnoses:  Peristomal hernia    Rx / DC Orders ED Discharge Orders     None         Neddie Steedman, Corene Cornea, MD 06/04/22 [phone]

## 2022-06-05 DIAGNOSIS — K433 Parastomal hernia with obstruction, without gangrene: Secondary | ICD-10-CM | POA: Diagnosis not present

## 2022-06-05 DIAGNOSIS — K50819 Crohn's disease of both small and large intestine with unspecified complications: Secondary | ICD-10-CM

## 2022-06-05 DIAGNOSIS — K50818 Crohn's disease of both small and large intestine with other complication: Secondary | ICD-10-CM | POA: Diagnosis not present

## 2022-06-05 LAB — BASIC METABOLIC PANEL
Anion gap: 11 (ref 5–15)
BUN: 9 mg/dL (ref 6–20)
CO2: 22 mmol/L (ref 22–32)
Calcium: 9 mg/dL (ref 8.9–10.3)
Chloride: 100 mmol/L (ref 98–111)
Creatinine, Ser: 0.71 mg/dL (ref 0.61–1.24)
GFR, Estimated: >60 mL/min (ref >60)
Glucose, Bld: 85 mg/dL (ref 70–99)
Potassium: 4 mmol/L (ref 3.5–5.1)
Sodium: 133 mmol/L — ABNORMAL LOW (ref 135–145)

## 2022-06-05 LAB — CBC
HCT: 40.5 % (ref 39.0–52.0)
Hemoglobin: 13.4 g/dL (ref 13.0–17.0)
MCH: 27.5 pg (ref 26.0–34.0)
MCHC: 33.1 g/dL (ref 30.0–36.0)
MCV: 83.2 fL (ref 80.0–100.0)
Platelets: 282 10*3/uL (ref 150–400)
RBC: 4.87 MIL/uL (ref 4.22–5.81)
RDW: 14.6 % (ref 11.5–15.5)
WBC: 6.3 10*3/uL (ref 4.0–10.5)
nRBC: 0 % (ref 0.0–0.2)

## 2022-06-05 MED ORDER — OXYCODONE HCL 5 MG PO TABS
5.0000 mg | ORAL_TABLET | ORAL | Status: DC | PRN
  Administered 2022-06-05 – 2022-06-08 (×12): 10 mg via ORAL
  Filled 2022-06-05 (×12): qty 2

## 2022-06-05 MED ORDER — HYDROMORPHONE HCL 1 MG/ML IJ SOLN
0.5000 mg | INTRAMUSCULAR | Status: DC | PRN
  Administered 2022-06-06 (×2): 2 mg via INTRAVENOUS
  Administered 2022-06-06: 1 mg via INTRAVENOUS
  Administered 2022-06-06 – 2022-06-07 (×2): 2 mg via INTRAVENOUS
  Filled 2022-06-05 (×5): qty 2

## 2022-06-05 MED ORDER — ACETAMINOPHEN 500 MG PO TABS
1000.0000 mg | ORAL_TABLET | Freq: Four times a day (QID) | ORAL | Status: DC
  Administered 2022-06-05 – 2022-06-08 (×11): 1000 mg via ORAL
  Filled 2022-06-05 (×11): qty 2

## 2022-06-05 NOTE — Progress Notes (Signed)
    HD#1 Subjective:  Overnight Events: none  Patient feeling tired this morning. Abdominal pain still especially worse with standing, pain medications have helped. He changes ostomy bag every 3-5 days.   Pt is updated on the plan for today, and all questions and concerns are addressed.   Objective:  Vital signs in last 24 hours: Vitals:   06/04/22 1116 06/04/22 1324 06/04/22 1926 06/05/22 0320  BP:  (!) 149/91 (!) 143/85 136/83  Pulse:  85 93 80  Resp:  20 16 16   Temp: 98.4 F (36.9 C)  98 F (36.7 C) 97.7 F (36.5 C)  TempSrc:   Oral Oral  SpO2:  100% 100% 98%    Physical Exam:  Constitutional: well-appearing, in no acute distress Cardiovascular: regular rate and rhythm, no m/r/g Pulmonary/Chest: normal work of breathing on room air, lungs clear to auscultation bilaterally Abdominal: firm hernia at stoma, stoma is well perfused, bowel sounds appreciated in all 4 quadrants, tenderness to palpation at stoma  300cc stool  Pertinent Labs: WBC 12.1 to 6.3 Hgb stable  Creatinine 1.19 to 0.7 Na stable  Imaging: CT abd IMPRESSION: 1. Prior colectomy with right lower quadrant ileocolostomy and sigmoid stump. 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 2. Mild wall thickening and fluid-filled terminal ileum extending into the ileostomy may indicate ileitis. 3. Rectosigmoid stump appears intact and decompressed.  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 with a pertinent PMH of Crohn's disease c/b TAC with end ileostomy, remote opioid dependence, who presented with abdominal pain and increased ostomy output and admitted on 3/29 for parastomal hernia on HD#1.   Parastomal hernia GI evaluated patient 3/29 and do not think presentation consistent with active Crohn's.   They are concerned for partial obstruction with high output, recommendation for operative management. Surgery saw patient today and plan for possible surgery 3/31 pending OR schedule. -regular diet today -NPO after midnight -trend CBC and BMP  Crohn's s/p TAC with EI Next stelera injection due 4/15. He talked with Dr. Hilarie Fredrickson 3/29 and plans for outpatient follow-up with [facility].  HTN Previously took amlodipine.  Pressures remain normotensive off of medications. -Needs to establish with PCP  Diet: Normal, NPO after midnight IVF: none VTE: Heparin Code: Full PT/OT recs: none Family Update: patient plans to provide   Dispo: Anticipated discharge to Home following surgical management of parastomal hernia.   Qiara Minetti M. Camarion Weier, D.O.  Internal Medicine Resident, PGY-2 [facility]  Pager: [phone] 7:10 AM, 06/05/2022   **Please contact the on call pager after 5 pm and on weekends at [phone].**

## 2022-06-05 NOTE — Progress Notes (Signed)
Subjective: No acute changes. Ileostomy is functioning, patient still has significant pain particularly when standing. Reports appetite is poor.   Objective: Vital signs in last 24 hours: Temp:  [97.7 F (36.5 C)-98 F (36.7 C)] 98 F (36.7 C) (03/30 0814) Pulse Rate:  [80-93] 86 (03/30 0814) Resp:  [16-20] 18 (03/30 0814) BP: (132-149)/(83-93) 132/93 (03/30 0814) SpO2:  [98 %-100 %] 98 % (03/30 0814) Last BM Date : 06/04/22  Intake/Output from previous day: 03/29 0701 - 03/30 0700 In: 2560 [I.V.:2560] Out: 1550 [Urine:1250; Stool:300] Intake/Output this shift: No intake/output data recorded.  PE: General: resting comfortably, NAD Neuro: alert and oriented, no focal deficits Resp: normal work of breathing on room air Abdomen: soft, nondistended, nontender to palpation. RLQ ileostomy productive of loose stool, parastomal hernia is soft. Extremities: warm and well-perfused   Lab Results:  Recent Labs    06/03/22 1946 06/05/22 0402  WBC 12.1* 6.3  HGB 15.7 13.4  HCT 47.4 40.5  PLT 426* 282   BMET Recent Labs    06/03/22 1946 06/05/22 0402  NA 134* 133*  K 4.1 4.0  CL 97* 100  CO2 23 22  GLUCOSE 109* 85  BUN 24* 9  CREATININE 1.19 0.71  CALCIUM 9.7 9.0   PT/INR No results for input(s): "LABPROT", "INR" in the last 72 hours. CMP     Component Value Date/Time   NA 133 (L) 06/05/2022 0402   K 4.0 06/05/2022 0402   CL 100 06/05/2022 0402   CO2 22 06/05/2022 0402   GLUCOSE 85 06/05/2022 0402   BUN 9 06/05/2022 0402   CREATININE 0.71 06/05/2022 0402   CREATININE 0.61 05/19/2015 1533   CALCIUM 9.0 06/05/2022 0402   PROT 9.4 (H) 06/03/2022 1946   ALBUMIN 4.3 06/03/2022 1946   AST 39 06/03/2022 1946   ALT 48 (H) 06/03/2022 1946   ALKPHOS 61 06/03/2022 1946   BILITOT 1.0 06/03/2022 1946   GFRNONAA >60 06/05/2022 0402   GFRNONAA >89 05/19/2015 1533   GFRAA >60 09/17/2015 2230   GFRAA >89 05/19/2015 1533   Lipase     Component Value  Date/Time   LIPASE 33 06/03/2022 1946       Studies/Results: CT ABDOMEN PELVIS W CONTRAST  Result Date: 06/04/2022 CLINICAL DATA:  Acute nonlocalized abdominal pain. Lower abdominal pain with diarrhea and nausea for several days. EXAM: CT ABDOMEN AND PELVIS WITH CONTRAST TECHNIQUE: Multidetector CT imaging of the abdomen and pelvis was performed using the standard protocol following bolus administration of intravenous contrast. RADIATION DOSE REDUCTION: This exam was performed according to the departmental dose-optimization program which includes automated exposure control, adjustment of the mA and/or kV according to patient size and/or use of iterative reconstruction technique. CONTRAST:  115mL OMNIPAQUE IOHEXOL 350 MG/ML SOLN COMPARISON:  07/09/2015 FINDINGS: Lower chest: Lung bases are clear. Hepatobiliary: No focal liver abnormality is seen. No gallstones, gallbladder wall thickening, or biliary dilatation. Pancreas: Unremarkable. No pancreatic ductal dilatation or surrounding inflammatory changes. Spleen: Normal in size without focal abnormality. Adrenals/Urinary Tract: Adrenal glands are unremarkable. Kidneys are normal, without renal calculi, focal lesion, or hydronephrosis. Bladder is unremarkable. Stomach/Bowel: Colectomy with right lower quadrant ileostomy and sigmoid stump. Peristomal hernia containing fat and ileum without evidence of proximal obstruction. Mild wall thickening with fluid-filled distal ileum may indicate ileitis. Small bowel are mostly decompressed. Stomach and rectosigmoid stump are unremarkable. Vascular/Lymphatic: No significant vascular findings are present. No enlarged abdominal or pelvic lymph nodes. Reproductive: Prostate is unremarkable. Other:  No free air or free fluid in the abdomen. Musculoskeletal: No acute or significant osseous findings. IMPRESSION: 1. Prior colectomy with right lower quadrant ileocolostomy and sigmoid stump. Peristomal hernia containing fat and  small bowel without evidence of proximal obstruction. 2. Mild wall thickening and fluid-filled terminal ileum extending into the ileostomy may indicate ileitis. 3. Rectosigmoid stump appears intact and decompressed. Electronically Signed   By: Lucienne Capers M.D.   On: 06/04/2022 03:07    Anti-infectives: Anti-infectives (From admission, onward)    None        Assessment/Plan 37 yo male with a history of Crohn's s/p TAC with end ileostomy in 2021. Now with parastomal hernia, non-obstructing but with significant symptoms.  - Evaluated by GI yesterday, do not feel that patient has active Crohn's at this time. - Given severity of hernia symptoms, I feel that patient would benefit from hernia repair this admission. Ultimately he can hopefully have elective ileostomy reversal. No OR time today for surgery, allow regular diet today and keep NPO after midnight. May need to defer until this week pending OR availability and other emergency add-ons, as patient is not obstructed and no concerns for ischemic bowel. - Surgery will follow   LOS: 1 day    Michaelle Birks, MD [facility] Surgery General, Hepatobiliary and Pancreatic Surgery 06/05/22 11:16 AM

## 2022-06-05 NOTE — Plan of Care (Signed)
  Problem: Activity: Goal: Risk for activity intolerance will decrease Outcome: Progressing   Problem: Nutrition: Goal: Adequate nutrition will be maintained Outcome: Progressing   Problem: Pain Managment: Goal: General experience of comfort will improve Outcome: Progressing   

## 2022-06-06 ENCOUNTER — Inpatient Hospital Stay (HOSPITAL_COMMUNITY): Payer: Medicare Other | Admitting: Certified Registered Nurse Anesthetist

## 2022-06-06 ENCOUNTER — Other Ambulatory Visit: Payer: Self-pay

## 2022-06-06 ENCOUNTER — Encounter (HOSPITAL_COMMUNITY)
Admission: EM | Disposition: A | Discharge status: Home / Self Care | Payer: Self-pay | Source: Home / Self Care | Attending: Internal Medicine

## 2022-06-06 ENCOUNTER — Encounter (HOSPITAL_COMMUNITY): Payer: Self-pay | Admitting: Internal Medicine

## 2022-06-06 DIAGNOSIS — K449 Diaphragmatic hernia without obstruction or gangrene: Secondary | ICD-10-CM

## 2022-06-06 DIAGNOSIS — K433 Parastomal hernia with obstruction, without gangrene: Secondary | ICD-10-CM | POA: Diagnosis not present

## 2022-06-06 DIAGNOSIS — Z932 Ileostomy status: Secondary | ICD-10-CM | POA: Diagnosis not present

## 2022-06-06 DIAGNOSIS — K50819 Crohn's disease of both small and large intestine with unspecified complications: Secondary | ICD-10-CM | POA: Diagnosis not present

## 2022-06-06 DIAGNOSIS — K50818 Crohn's disease of both small and large intestine with other complication: Secondary | ICD-10-CM | POA: Diagnosis not present

## 2022-06-06 HISTORY — PX: PARASTOMAL HERNIA REPAIR: SHX2162

## 2022-06-06 LAB — CBC
HCT: 40.6 % (ref 39.0–52.0)
Hemoglobin: 13.5 g/dL (ref 13.0–17.0)
MCH: 27.6 pg (ref 26.0–34.0)
MCHC: 33.3 g/dL (ref 30.0–36.0)
MCV: 82.9 fL (ref 80.0–100.0)
Platelets: 295 10*3/uL (ref 150–400)
RBC: 4.9 MIL/uL (ref 4.22–5.81)
RDW: 14.1 % (ref 11.5–15.5)
WBC: 6.1 10*3/uL (ref 4.0–10.5)
nRBC: 0 % (ref 0.0–0.2)

## 2022-06-06 LAB — BASIC METABOLIC PANEL
Anion gap: 14 (ref 5–15)
BUN: 7 mg/dL (ref 6–20)
CO2: 25 mmol/L (ref 22–32)
Calcium: 9.3 mg/dL (ref 8.9–10.3)
Chloride: 98 mmol/L (ref 98–111)
Creatinine, Ser: 0.79 mg/dL (ref 0.61–1.24)
GFR, Estimated: >60 mL/min (ref >60)
Glucose, Bld: 94 mg/dL (ref 70–99)
Potassium: 3.7 mmol/L (ref 3.5–5.1)
Sodium: 137 mmol/L (ref 135–145)

## 2022-06-06 LAB — SURGICAL PCR SCREEN
MRSA, PCR: NEGATIVE
Staphylococcus aureus: NEGATIVE

## 2022-06-06 SURGERY — REPAIR, HERNIA, PARASTOMAL
Anesthesia: General | Site: Abdomen

## 2022-06-06 MED ORDER — HYDROMORPHONE HCL 1 MG/ML IJ SOLN
INTRAMUSCULAR | Status: AC
  Filled 2022-06-06: qty 1

## 2022-06-06 MED ORDER — SUGAMMADEX SODIUM 200 MG/2ML IV SOLN
INTRAVENOUS | Status: DC | PRN
  Administered 2022-06-06: 300 mg via INTRAVENOUS

## 2022-06-06 MED ORDER — CEFAZOLIN SODIUM-DEXTROSE 2-4 GM/100ML-% IV SOLN: 2.0000 g | INTRAVENOUS | Status: DC

## 2022-06-06 MED ORDER — ENSURE ENLIVE PO LIQD
237.0000 mL | Freq: Two times a day (BID) | ORAL | Status: DC
  Administered 2022-06-08: 237 mL via ORAL

## 2022-06-06 MED ORDER — FENTANYL CITRATE (PF) 250 MCG/5ML IJ SOLN
INTRAMUSCULAR | Status: AC
  Filled 2022-06-06: qty 5

## 2022-06-06 MED ORDER — ROCURONIUM BROMIDE 10 MG/ML (PF) SYRINGE
PREFILLED_SYRINGE | INTRAVENOUS | Status: DC | PRN
  Administered 2022-06-06: 50 mg via INTRAVENOUS
  Administered 2022-06-06: 10 mg via INTRAVENOUS

## 2022-06-06 MED ORDER — PROPOFOL 10 MG/ML IV BOLUS
INTRAVENOUS | Status: DC | PRN
  Administered 2022-06-06: 200 mg via INTRAVENOUS

## 2022-06-06 MED ORDER — PHENYLEPHRINE 80 MCG/ML (10ML) SYRINGE FOR IV PUSH (FOR BLOOD PRESSURE SUPPORT)
PREFILLED_SYRINGE | INTRAVENOUS | Status: DC | PRN
  Administered 2022-06-06 (×4): 80 ug via INTRAVENOUS

## 2022-06-06 MED ORDER — LACTATED RINGERS IV SOLN: INTRAVENOUS | Status: DC

## 2022-06-06 MED ORDER — ONDANSETRON HCL 4 MG/2ML IJ SOLN
INTRAMUSCULAR | Status: DC | PRN
  Administered 2022-06-06: 4 mg via INTRAVENOUS

## 2022-06-06 MED ORDER — CHLORHEXIDINE GLUCONATE 0.12 % MT SOLN
OROMUCOSAL | Status: AC
  Administered 2022-06-06: 15 mL via OROMUCOSAL
  Filled 2022-06-06: qty 15

## 2022-06-06 MED ORDER — FENTANYL CITRATE (PF) 250 MCG/5ML IJ SOLN
INTRAMUSCULAR | Status: DC | PRN
  Administered 2022-06-06: 25 ug via INTRAVENOUS
  Administered 2022-06-06: 75 ug via INTRAVENOUS
  Administered 2022-06-06 (×3): 50 ug via INTRAVENOUS

## 2022-06-06 MED ORDER — PROPOFOL 10 MG/ML IV BOLUS
INTRAVENOUS | Status: AC
  Filled 2022-06-06: qty 20

## 2022-06-06 MED ORDER — CHLORHEXIDINE GLUCONATE 0.12 % MT SOLN: 15.0000 mL | Freq: Once | OROMUCOSAL | Status: AC

## 2022-06-06 MED ORDER — MIDAZOLAM HCL 2 MG/2ML IJ SOLN
INTRAMUSCULAR | Status: DC | PRN
  Administered 2022-06-06: 2 mg via INTRAVENOUS

## 2022-06-06 MED ORDER — WHITE PETROLATUM EX OINT
TOPICAL_OINTMENT | Freq: Two times a day (BID) | CUTANEOUS | Status: DC
  Administered 2022-06-07 – 2022-06-08 (×2): 0.2 via TOPICAL
  Filled 2022-06-06: qty 28.35

## 2022-06-06 MED ORDER — 0.9 % SODIUM CHLORIDE (POUR BTL) OPTIME
TOPICAL | Status: DC | PRN
  Administered 2022-06-06: 2000 mL

## 2022-06-06 MED ORDER — ORAL CARE MOUTH RINSE: 15.0000 mL | Freq: Once | OROMUCOSAL | Status: AC

## 2022-06-06 MED ORDER — MIDAZOLAM HCL 2 MG/2ML IJ SOLN
INTRAMUSCULAR | Status: AC
  Filled 2022-06-06: qty 2

## 2022-06-06 MED ORDER — DEXAMETHASONE SODIUM PHOSPHATE 10 MG/ML IJ SOLN
INTRAMUSCULAR | Status: DC | PRN
  Administered 2022-06-06: 10 mg via INTRAVENOUS

## 2022-06-06 MED ORDER — CEFAZOLIN SODIUM-DEXTROSE 2-4 GM/100ML-% IV SOLN
INTRAVENOUS | Status: AC
  Filled 2022-06-06: qty 100

## 2022-06-06 MED ORDER — HYDROMORPHONE HCL 1 MG/ML IJ SOLN
0.2500 mg | INTRAMUSCULAR | Status: DC | PRN
  Administered 2022-06-06 (×3): 0.5 mg via INTRAVENOUS

## 2022-06-06 MED ORDER — LIDOCAINE 2% (20 MG/ML) 5 ML SYRINGE
INTRAMUSCULAR | Status: DC | PRN
  Administered 2022-06-06: 60 mg via INTRAVENOUS

## 2022-06-06 SURGICAL SUPPLY — 47 items
BAG COUNTER SPONGE SURGICOUNT (BAG) ×1 IMPLANT
BLADE CLIPPER SURG (BLADE) IMPLANT
CANISTER SUCT 3000ML PPV (MISCELLANEOUS) ×1 IMPLANT
CELLS DAT CNTRL 66122 CELL SVR (MISCELLANEOUS) IMPLANT
COVER SURGICAL LIGHT HANDLE (MISCELLANEOUS) ×2 IMPLANT
DRAPE INCISE IOBAN 66X45 STRL (DRAPES) ×1 IMPLANT
DRSG OPSITE POSTOP 4X10 (GAUZE/BANDAGES/DRESSINGS) IMPLANT
DRSG OPSITE POSTOP 4X8 (GAUZE/BANDAGES/DRESSINGS) IMPLANT
ELECT CAUTERY BLADE 6.4 (BLADE) ×1 IMPLANT
ELECT REM PT RETURN 9FT ADLT (ELECTROSURGICAL) ×1
ELECTRODE REM PT RTRN 9FT ADLT (ELECTROSURGICAL) ×1 IMPLANT
GLOVE BIO SURGEON STRL SZ 6 (GLOVE) ×2 IMPLANT
GLOVE INDICATOR 6.5 STRL GRN (GLOVE) ×2 IMPLANT
GLOVE SURG POLY MICRO LF SZ5.5 (GLOVE) ×1 IMPLANT
GLOVE SURG UNDER POLY LF SZ6 (GLOVE) ×1 IMPLANT
GOWN STRL REUS W/ TWL LRG LVL3 (GOWN DISPOSABLE) ×4 IMPLANT
GOWN STRL REUS W/TWL LRG LVL3 (GOWN DISPOSABLE) ×4
HEMOSTAT SNOW SURGICEL 2X4 (HEMOSTASIS) ×1 IMPLANT
KIT OSTOMY DRAINABLE 2.75 STR (WOUND CARE) IMPLANT
KIT TURNOVER KIT B (KITS) ×1 IMPLANT
LIGASURE IMPACT 36 18CM CVD LR (INSTRUMENTS) IMPLANT
NS IRRIG 1000ML POUR BTL (IV SOLUTION) ×2 IMPLANT
PACK COLON (CUSTOM PROCEDURE TRAY) ×1 IMPLANT
PAD ARMBOARD 7.5X6 YLW CONV (MISCELLANEOUS) ×1 IMPLANT
PENCIL BUTTON HOLSTER BLD 10FT (ELECTRODE) ×1 IMPLANT
RELOAD PROXIMATE 75MM BLUE (ENDOMECHANICALS) IMPLANT
RELOAD STAPLE 75 3.8 BLU REG (ENDOMECHANICALS) IMPLANT
RETRACTOR WND ALEXIS 18 MED (MISCELLANEOUS) IMPLANT
RETRACTOR WND ALEXIS 25 LRG (MISCELLANEOUS) IMPLANT
RTRCTR WOUND ALEXIS 18CM MED (MISCELLANEOUS)
RTRCTR WOUND ALEXIS 25CM LRG (MISCELLANEOUS)
SPECIMEN JAR X LARGE (MISCELLANEOUS) IMPLANT
SPONGE T-LAP 18X18 ~~LOC~~+RFID (SPONGE) ×2 IMPLANT
STAPLER GUN LINEAR PROX 60 (STAPLE) IMPLANT
STAPLER PROXIMATE 75MM BLUE (STAPLE) IMPLANT
STAPLER VISISTAT 35W (STAPLE) IMPLANT
SUT NOVA 0 T19/GS 22DT (SUTURE) IMPLANT
SUT NOVA 1 T20/GS 25DT (SUTURE) IMPLANT
SUT PDS AB 1 TP1 54 (SUTURE) ×2 IMPLANT
SUT SILK 2 0 SH CR/8 (SUTURE) ×1 IMPLANT
SUT SILK 3 0 SH CR/8 (SUTURE) ×1 IMPLANT
SUT VIC AB 2-0 SH 18 (SUTURE) ×1 IMPLANT
SUT VIC AB 2-0 SH 27 (SUTURE) ×1
SUT VIC AB 2-0 SH 27XBRD (SUTURE) ×1 IMPLANT
SUT VIC AB 3-0 MH 27 (SUTURE) ×2 IMPLANT
TRAY FOLEY MTR SLVR 14FR STAT (SET/KITS/TRAYS/PACK) ×1 IMPLANT
TUBE CONNECTING 12X1/4 (SUCTIONS) ×2 IMPLANT

## 2022-06-06 NOTE — Anesthesia Procedure Notes (Signed)
Procedure Name: Intubation Date/Time: 06/06/2022 7:38 AM  Performed by: Clearnce Sorrel, CRNAPre-anesthesia Checklist: Patient identified, Emergency Drugs available, Suction available and Patient being monitored Patient Re-evaluated:Patient Re-evaluated prior to induction Oxygen Delivery Method: Circle System Utilized Preoxygenation: Pre-oxygenation with 100% oxygen Induction Type: IV induction Ventilation: Mask ventilation without difficulty Laryngoscope Size: Mac and 4 Grade View: Grade II Tube type: Oral Tube size: 7.5 mm Number of attempts: 1 Airway Equipment and Method: Stylet and Oral airway Placement Confirmation: ETT inserted through vocal cords under direct vision, positive ETCO2 and breath sounds checked- equal and bilateral Secured at: 24 cm Tube secured with: Tape Dental Injury: Teeth and Oropharynx as per pre-operative assessment

## 2022-06-06 NOTE — Anesthesia Preprocedure Evaluation (Addendum)
Anesthesia Evaluation  Patient identified by MRN, date of birth, ID band Patient awake    Reviewed: Allergy & Precautions, H&P , NPO status , Patient's Chart, lab work & pertinent test results  Airway Mallampati: II  TM Distance: >3 FB Neck ROM: Full    Dental no notable dental hx. (+) Teeth Intact, Dental Advisory Given   Pulmonary neg pulmonary ROS   Pulmonary exam normal breath sounds clear to auscultation       Cardiovascular hypertension,  Rhythm:Regular Rate:Normal     Neuro/Psych   Anxiety Depression    negative neurological ROS     GI/Hepatic Neg liver ROS,GERD  Medicated,,  Endo/Other  negative endocrine ROS    Renal/GU negative Renal ROS  negative genitourinary   Musculoskeletal  (+) Arthritis ,    Abdominal   Peds  Hematology  (+) Blood dyscrasia, anemia   Anesthesia Other Findings   Reproductive/Obstetrics negative OB ROS                             Anesthesia Physical Anesthesia Plan  ASA: 2  Anesthesia Plan: General   Post-op Pain Management: Tylenol PO (pre-op)*   Induction: Intravenous  PONV Risk Score and Plan: 3 and Ondansetron, Dexamethasone and Midazolam  Airway Management Planned: Oral ETT  Additional Equipment:   Intra-op Plan:   Post-operative Plan: Extubation in OR  Informed Consent: I have reviewed the patients History and Physical, chart, labs and discussed the procedure including the risks, benefits and alternatives for the proposed anesthesia with the patient or authorized representative who has indicated his/her understanding and acceptance.     Dental advisory given  Plan Discussed with: CRNA  Anesthesia Plan Comments:        Anesthesia Quick Evaluation

## 2022-06-06 NOTE — Progress Notes (Signed)
    Day of Surgery  Subjective: Continued pain. No other changes.   Objective: Vital signs in last 24 hours: Temp:  [98 F (36.7 C)-98.4 F (36.9 C)] 98 F (36.7 C) (03/31 0508) Pulse Rate:  [80-98] 80 (03/31 0508) Resp:  [17-18] 18 (03/31 0508) BP: (132-147)/(84-93) 143/84 (03/31 0508) SpO2:  [97 %-99 %] 97 % (03/31 0508) Last BM Date : 06/05/22  Intake/Output from previous day: 03/30 0701 - 03/31 0700 In: -  Out: 750 [Urine:750] Intake/Output this shift: No intake/output data recorded.  PE: General: resting comfortably, NAD Neuro: alert and oriented, no focal deficits Resp: normal work of breathing on room air Abdomen: soft, nondistended, nontender to palpation. RLQ ileostomy productive of loose stool, parastomal hernia is soft. Extremities: warm and well-perfused   Lab Results:  Recent Labs    06/05/22 0402 06/06/22 0345  WBC 6.3 6.1  HGB 13.4 13.5  HCT 40.5 40.6  PLT 282 295   BMET Recent Labs    06/05/22 0402 06/06/22 0345  NA 133* 137  K 4.0 3.7  CL 100 98  CO2 22 25  GLUCOSE 85 94  BUN 9 7  CREATININE 0.71 0.79  CALCIUM 9.0 9.3   PT/INR No results for input(s): "LABPROT", "INR" in the last 72 hours. CMP     Component Value Date/Time   NA 137 06/06/2022 0345   K 3.7 06/06/2022 0345   CL 98 06/06/2022 0345   CO2 25 06/06/2022 0345   GLUCOSE 94 06/06/2022 0345   BUN 7 06/06/2022 0345   CREATININE 0.79 06/06/2022 0345   CREATININE 0.61 05/19/2015 1533   CALCIUM 9.3 06/06/2022 0345   PROT 9.4 (H) 06/03/2022 1946   ALBUMIN 4.3 06/03/2022 1946   AST 39 06/03/2022 1946   ALT 48 (H) 06/03/2022 1946   ALKPHOS 61 06/03/2022 1946   BILITOT 1.0 06/03/2022 1946   GFRNONAA >60 06/06/2022 0345   GFRNONAA >89 05/19/2015 1533   GFRAA >60 09/17/2015 2230   GFRAA >89 05/19/2015 1533   Lipase     Component Value Date/Time   LIPASE 33 06/03/2022 1946       Studies/Results: No results found.  Anti-infectives: Anti-infectives (From  admission, onward)    Start     Dose/Rate Route Frequency Ordered Stop   06/06/22 0700  ceFAZolin (ANCEF) IVPB 2g/100 mL premix        2 g 200 mL/hr over 30 Minutes Intravenous On call to O.R. 06/06/22 [id number] 06/07/22 0559        Assessment/Plan 37 yo male with a history of Crohn's s/p TAC with end ileostomy in 2021. Now with parastomal hernia, non-obstructing but with significant symptoms.  - Evaluated by GI, do not feel that patient has active Crohn's at this time. - Given severity of hernia symptoms, will proceed with ileostomy revision and parastomal hernia repair today. I discussed the surgical plan with the patient. I have also discussed this repair would likely not last long-term, but our plan is to relieve his symptoms and ultimately allow him to proceed with elective ileostomy takedown for more definitive treatment after he has had GI follow up to ensure his Crohn's is optimized. I reviewed the benefits and risks of surgery and he consents to proceed.    LOS: 2 days    Michaelle Birks, MD [facility] Surgery General, Hepatobiliary and Pancreatic Surgery 06/06/22 6:59 AM

## 2022-06-06 NOTE — Anesthesia Postprocedure Evaluation (Signed)
Anesthesia Post Note  Patient: Troy Ramirez  Procedure(s) Performed: HERNIA REPAIR PARASTOMAL, Illeostomy Revision (Abdomen)     Patient location during evaluation: PACU Anesthesia Type: General Level of consciousness: awake and alert Pain management: pain level controlled Vital Signs Assessment: post-procedure vital signs reviewed and stable Respiratory status: spontaneous breathing, nonlabored ventilation, respiratory function stable and patient connected to nasal cannula oxygen Cardiovascular status: blood pressure returned to baseline and stable Postop Assessment: no apparent nausea or vomiting Anesthetic complications: no  No notable events documented.  Last Vitals:  Vitals:   06/06/22 0930 06/06/22 0935  BP: (!) 143/88   Pulse: 78 83  Resp: 10 11  Temp: 36.6 C   SpO2: 92% 97%    Last Pain:  Vitals:   06/06/22 0930  TempSrc:   PainSc: 8                  Ameirah Khatoon,W. EDMOND

## 2022-06-06 NOTE — Consult Note (Signed)
[place] Nurse ostomy consult note Consult received for end ileostomy revision with primary repair of parastomal hernia performed today by Dr. [name].  There are no WOC Nurses on site today, but we will see tomorrow, 06/07/22.  [place] nursing team will follow, and will remain available to this patient, the nursing and medical teams.    Thank you for inviting [place] to participate in this patient's Plan of Care.  Maudie Flakes, MSN, RN, CNS, [place], Serita Grammes, [employer], [employer] phone:  [phone]

## 2022-06-06 NOTE — Transfer of Care (Signed)
Immediate Anesthesia Transfer of Care Note  Patient: Troy Ramirez  Procedure(s) Performed: HERNIA REPAIR PARASTOMAL, Illeostomy Revision (Abdomen)  Patient Location: PACU  Anesthesia Type:General  Level of Consciousness: awake, alert , and oriented  Airway & Oxygen Therapy: Patient Spontanous Breathing  Post-op Assessment: Report given to RN and Post -op Vital signs reviewed and stable  Post vital signs: Reviewed and stable  Last Vitals:  Vitals Value Taken Time  BP 163/111 06/06/22 0900  Temp    Pulse 98 06/06/22 0902  Resp 20 06/06/22 0902  SpO2 92 % 06/06/22 0902  Vitals shown include unvalidated device data.  Last Pain:  Vitals:   06/06/22 0711  TempSrc: Oral  PainSc: 0-No pain      Patients Stated Pain Goal: 3 ([id number])  Complications: No notable events documented.

## 2022-06-06 NOTE — Progress Notes (Signed)
    HD#2 Subjective:  Overnight Events: none  Assessed at bedside after getting back up from the OR.  He is notably grimacing and reports pain in his belly.  Not received additional pain medications since getting up from the OR.  He is worried about getting abdominal binder to prevent hernia from happening again.  We discussed goal for today being getting pain under better control and we could discuss next steps once that is taking care of.  Pt is updated on the plan for today, and all questions and concerns are addressed.   Objective:  Vital signs in last 24 hours: Vitals:   06/05/22 0814 06/05/22 1545 06/05/22 2018 06/06/22 0508  BP: (!) 132/93 (!) 147/87 135/86 (!) 143/84  Pulse: 86 98 90 80  Resp: 18 17 18 18   Temp: 98 F (36.7 C) 98.4 F (36.9 C) 98 F (36.7 C) 98 F (36.7 C)  TempSrc: Oral Oral Oral Oral  SpO2: 98% 99% 98% 97%    Physical Exam:  Constitutional: in no acute distress Cardiovascular: regular rate and rhythm, no m/r/g Pulmonary/Chest: normal work of breathing on room air, lungs clear to auscultation bilaterally Abdominal: stoma is well perfused, bowel sounds appreciated in all 4 quadrants, tenderness to palpation of abdomen diffusely   Pertinent Labs: CBC and WBC stable  Assessment/Plan:   Principal Problem:   Parastomal hernia Active Problems:   Crohn's proctitis (HCC)   Peristomal hernia   Ileostomy in place (HCC)   Crohn's disease of both small and large intestine with complication [facility])   Patient Summary: Troy Ramirez is a 37 y.o. with a pertinent PMH of Crohn's disease c/b TAC with end ileostomy, remote opioid dependence, who presented with abdominal pain and increased ostomy output and admitted on 3/29 for parastomal hernia s/p hernia repair with ileostomy revision 3/31 on HD#2.   Parastomal hernia He was able to go to OR this morning for hernia repair and ileostomy revision.  From op note surgery went well.  On exam new stoma is  well-perfused.  Patient reports abdominal pain with notable grimacing on exam.  His abdomen is soft with bowel sounds appreciated on all 4 quadrants.  Spoke with nurse on the floor about giving additional IV pain medications.  Wound care will see patient tomorrow.  I would like him to work with PT/OT tomorrow to help with education to minimize pain with movement. -Regular diet -Appreciate surgeries assistance in management of this case. -Will need follow-up with GEN surg following discharge -Trend CBC and BMP -PT/ OT evaluate and treat  Crohn's s/p TAC with EI Next stelera injection due 4/15. He talked with Dr. Hilarie Fredrickson 3/29 and plans for outpatient follow-up with [facility]. -Outpatient GI follow-up  HTN Previously took amlodipine.  Pressures remain normotensive off of medications. -Needs to establish with PCP  Diet: Normal IVF: none VTE: Heparin Code: Full PT/OT recs: none Family Update: patient plans to provide   Dispo: Anticipated discharge to Home possibly in 1-2 days depending on pain control.  Troy Ramirez, D.O.  Internal Medicine Resident, PGY-2 [facility]  Pager: [phone] 6:46 AM, 06/06/2022   **Please contact the on call pager after 5 pm and on weekends at [phone].**

## 2022-06-06 NOTE — Op Note (Addendum)
Date: 06/06/22  Patient: Troy Ramirez MRN: [id number]  Preoperative Diagnosis: Parastomal hernia Postoperative Diagnosis: Same  Procedure: End ileostomy revision with primary repair of parastomal hernia  Surgeon: Michaelle Birks, MD  EBL: Minimal  Anesthesia: General endotracheal  Specimens: Ileostomy stoma  Indications: Troy Ramirez is a 37 yo male with a history of Crohn's disease, for which he underwent a total abdominal colectomy with end ileostomy in 2021. He subsequently developed a parastomal hernia, which was initially reducible but for the last few days has been nonreducible and increasingly painful, prompting him to present to the ED. After a discussion of the risks and benefits of surgery, he agreed to proceed with parastomal hernia repair.  Findings: Parastomal hernia containing viable small bowel without signs of obstruction, with a 6cm fascial opening. The hernia was primarily repaired and the ostomy re-seated at the same site.  Procedure details: Informed consent was obtained in the preoperative area prior to the procedure. The patient was brought to the operating room and placed on the table in the supine position. General anesthesia was induced and appropriate lines and drains were placed for intraoperative monitoring. Perioperative antibiotics were administered per SCIP guidelines. The abdomen was prepped and draped in the usual sterile fashion. A pre-procedure timeout was taken verifying patient identity, surgical site and procedure to be performed.  The ileostomy stoma was separated from the skin with cautery by making a circular incision approximately 10mm from the mucocutaneous junction. The subcutaneous tissue was divided with cautery and the hernia sac was entered. The sac contained loops of viable small bowel. The sac was circumferentially dissected off the fascia using blunt dissection and cautery. Inferiorly, the ileum was adherent to the fascia and was separated from the  fascia sharply using metzenbaum scissors. There were interloop adhesions within the hernia sac, which were sharply lysed to free the herniated small bowel. This allowed the herniated bowel to be reduced back into the abdomen. Superiorly, the mesentery of the ileum was adherent to the fascia, and this was separated using cautery and blunt dissection. At this point the ileum was completely freed from the fascia and mobile. Digital palpation of the undersurface of the fascia confirmed that the fascia was completely free of any adhesions. The hernia sac was then dissected out of the subcutaneous tissue and discarded. The fascial defect was palpated and was approximately 6cm in length. The superior end of the defect was closed using a total of three interrupted 0 Novafil figure-of-eight sutures. This closed down the fascial opening so that it could accommodate only two fingerbreadths.  The end of the ileum was examined and was viable with no injuries. The bowel was properly oriented with mesentery at the superior aspect without twisting. A mesenteric window was created in the very distal end of the ileum approximately 2cm proximal to the stoma. The mesentery was then clamped, divided and ligated with 2-0 silk ties. The ileum was then transected just proximal to the stoma with cautery. The stoma was sent for routine pathology. The new opening was digitized and was widely patent through the fascia. The ileostomy was matured in [place] fashion using 3-0 Vicryl sutures. On completion of the ileostomy it was again digitized and was patent through the fascia. An ostomy appliance was placed.  The patient tolerated the procedure well with no apparent complications. All counts were correct x2 at the end of the procedure. The patient was extubated and taken to PACU in stable condition.  Michaelle Birks, MD 06/06/22 8:53 AM

## 2022-06-06 NOTE — Progress Notes (Signed)
    Progress Note   Assessment    37 year old with longstanding ileocolonic Crohn's disease status post end ileostomy and rectal stump in place presenting with parastomal hernia and partial obstruction now status post end ileostomy revision and primary repair of parastomal hernia POD #1   Recommendations   1.  Parastomal hernia --end ileostomy revision with primary repair of parastomal hernia, POD #0.  Follow-up pathology from ileal stoma to see if there is any active Crohn's at that site. -- Postoperative care per general surgery  2.  Ileal and colonic Crohn's disease --he will follow-up with me as an outpatient.  Plan is to continue Stelara.  Patient is very interested in having an ileostomy reversed but we need to get objective evidence about his disease activity in both the ileum and rectal stump prior to this decision -- Outpatient follow-up with me, I will arrange -- Continue Stelara 90 mg every 4 weeks  No endoscopic plans while here, I will sign off but GI is available if needed.  Call if questions  Chief Complaint   Parastomal hernia repair this morning Patient without significant pain Still sleepy postanesthesia  Vital signs in last 24 hours: Temp:  [97.9 F (36.6 C)-98.6 F (37 C)] 98.6 F (37 C) (03/31 0947) Pulse Rate:  [78-98] 88 (03/31 0947) Resp:  [10-18] 18 (03/31 0947) BP: (135-163)/(84-111) 148/97 (03/31 0947) SpO2:  [92 %-99 %] 97 % (03/31 0947) Weight:  [115.7 kg] 115.7 kg (03/31 0711) Last BM Date : 06/05/22 Gen: awake, alert, NAD HEENT: anicteric  CV: RRR, no mrg Pulm: CTA b/l Abd: soft, parastomal hernia reduced, ostomy pink, output slightly bloody, hypoactive BS  Ext: no c/c/e Neuro: nonfocal   Intake/Output from previous day: 03/30 0701 - 03/31 0700 In: -  Out: 750 [Urine:750] Intake/Output this shift: Total I/O In: 900 [I.V.:900] Out: 25 [Blood:25]  Lab Results: Recent Labs    06/03/22 1946 06/05/22 0402 06/06/22 0345  WBC 12.1*  6.3 6.1  HGB 15.7 13.4 13.5  HCT 47.4 40.5 40.6  PLT 426* 282 295   BMET Recent Labs    06/03/22 1946 06/05/22 0402 06/06/22 0345  NA 134* 133* 137  K 4.1 4.0 3.7  CL 97* 100 98  CO2 23 22 25   GLUCOSE 109* 85 94  BUN 24* 9 7  CREATININE 1.19 0.71 0.79  CALCIUM 9.7 9.0 9.3   LFT Recent Labs    06/03/22 1946  PROT 9.4*  ALBUMIN 4.3  AST 39  ALT 48*  ALKPHOS 61  BILITOT 1.0      LOS: 2 days   Jerene Bears, MD 06/06/2022, 12:14 PM See Shea Evans, [facility], to contact our on call provider

## 2022-06-07 ENCOUNTER — Other Ambulatory Visit: Payer: Self-pay

## 2022-06-07 ENCOUNTER — Telehealth: Payer: Self-pay

## 2022-06-07 DIAGNOSIS — K435 Parastomal hernia without obstruction or  gangrene: Secondary | ICD-10-CM | POA: Diagnosis not present

## 2022-06-07 LAB — CBC
HCT: 40 % (ref 39.0–52.0)
Hemoglobin: 12.9 g/dL — ABNORMAL LOW (ref 13.0–17.0)
MCH: 27.4 pg (ref 26.0–34.0)
MCHC: 32.3 g/dL (ref 30.0–36.0)
MCV: 85.1 fL (ref 80.0–100.0)
Platelets: 345 10*3/uL (ref 150–400)
RBC: 4.7 MIL/uL (ref 4.22–5.81)
RDW: 13.9 % (ref 11.5–15.5)
WBC: 10.5 10*3/uL (ref 4.0–10.5)
nRBC: 0 % (ref 0.0–0.2)

## 2022-06-07 LAB — BASIC METABOLIC PANEL
Anion gap: 12 (ref 5–15)
BUN: 8 mg/dL (ref 6–20)
CO2: 24 mmol/L (ref 22–32)
Calcium: 8.8 mg/dL — ABNORMAL LOW (ref 8.9–10.3)
Chloride: 101 mmol/L (ref 98–111)
Creatinine, Ser: 0.75 mg/dL (ref 0.61–1.24)
GFR, Estimated: >60 mL/min (ref >60)
Glucose, Bld: 127 mg/dL — ABNORMAL HIGH (ref 70–99)
Potassium: 3.7 mmol/L (ref 3.5–5.1)
Sodium: 137 mmol/L (ref 135–145)

## 2022-06-07 MED ORDER — STELARA 90 MG/ML ~~LOC~~ SOSY
90.0000 mg | PREFILLED_SYRINGE | SUBCUTANEOUS | 6 refills | Status: DC
  Filled 2022-06-07: qty 1, 28d supply, fill #0

## 2022-06-07 MED ORDER — ENOXAPARIN SODIUM 60 MG/0.6ML IJ SOSY
60.0000 mg | PREFILLED_SYRINGE | INTRAMUSCULAR | Status: DC
  Administered 2022-06-07: 60 mg via SUBCUTANEOUS
  Filled 2022-06-07: qty 0.6

## 2022-06-07 NOTE — Telephone Encounter (Signed)
-----   Message from Jerene Bears, MD sent at 06/06/2022 12:21 PM EDT ----- Vaughan Basta,  This patient has Crohn's disease and will reestablish with us/me after hospital discharge Please prescribe Stelara 90 mg every 4 weeks (note the shorter than usual interval).  This is his current dose. Due dose 06/21/22 Routine followup with me, does not need to be urgent JMP

## 2022-06-07 NOTE — Consult Note (Signed)
[facility] Nurse ostomy consult note Pt had ileostomy surgery/hernia repair performed 3/31.  He states he is familiar with pouching routines and is independent with application and emptying.  Current pouch is intact with good seal, mod amt liquid brown stool.  Stoma is red and viable when visualized through the pouch.  He currently has been using barrier strips and one piece flat Coloplast pouches and is already set up with a supplier prior to admission. I told him we do not carry that product in the [place] and I will order [employer] products; he is currently wearing a 2 3/4 inch 2 piece pouching system and he agrees with this plan.  Ordered 3 sets of supplies to the room for patient: use supplies: barrier ring, Kellie Simmering # [phone], wafer Kellie Simmering # 2, pouch Lawson # [phone].  He had questions regarding an ostomy belt so I discussed their use and ordered one to his room, I also provided him with Stealth belt information.  He denies further questions of need for assistance.  Please re-consult if further assistance is needed.  Gae Dry MSN, RN, [place], [place], [place]  Stoma type/location:

## 2022-06-07 NOTE — Evaluation (Signed)
Occupational Therapy Evaluation/Discharge Patient Details Name: Troy Ramirez MRN: [id number] DOB: 05-26-1985 Today's Date: 06/07/2022   History of Present Illness Mr Troy Ramirez is a 37 yo M admitted with parastomal hernia and partial obstruction now status post end ileostomy revision and primary repair of parastomal hernia; with a PMH of HTN, obesity, and crohn's disease c/b multiple strictures s/p total abdominal colectomy with end ileostomy.   Clinical Impression   PTA, pt recently moved from [place], [place] to live with family in the area. Pt reports typically completely independent in all daily tasks and walking up to 2 miles often w/ his dog. Pt presents now limited primarily by pain though has been managing bathroom mobility and ADLs independently since admission. Extended time spent discussing: safety precautions w/ caring for dog, strategies for LB ADLs, frequent mobility at home and use of AE to minimize bending. Pt verbalized understanding of all education, declines need for OT services at this time though does request a RW for long distance mobility initially.. OT to sign off acutely.       Recommendations for follow up therapy are one component of a multi-disciplinary discharge planning process, led by the attending physician.  Recommendations may be updated based on patient status, additional functional criteria and insurance authorization.   Assistance Recommended at Discharge PRN  Patient can return home with the following Assistance with cooking/housework;Assist for transportation    Functional Status Assessment  Patient has had a recent decline in their functional status and demonstrates the ability to make significant improvements in function in a reasonable and predictable amount of time.  Equipment Recommendations  Other (comment) (RW)    Recommendations for Other Services       Precautions / Restrictions Precautions Precautions: Other (comment) Precaution Comments:  ileostomy Restrictions Weight Bearing Restrictions: No      Mobility Bed Mobility Overal bed mobility: Modified Independent             General bed mobility comments: able to briefly sit EOB but returned to supine due to pain    Transfers                          Balance Overall balance assessment: No apparent balance deficits (not formally assessed)                                         ADL either performed or assessed with clinical judgement   ADL Overall ADL's : Modified independent                                       General ADL Comments: Discussed strategies for LB ADLs to avoid excessive bending to minimize pain, using reacher to obtain items from floor, encouraged HOB flat for truncal extension stretch to avoid muscle tightness, and frequent mobility at home. Encouraged caution when walking dog and when first home as dog may be excited and jump/injury surgical site. Discussed pain regime schedule, setting alarms to stay on top of pain meds at home. Per pt and nursing, pt has demo ability to ambulate to bathroom and dress self without issue     Vision Ability to See in Adequate Light: 0 Adequate Patient Visual Report: No change from baseline Vision Assessment?: No apparent visual deficits  Perception     Praxis      Pertinent Vitals/Pain Pain Assessment Pain Assessment: Faces Faces Pain Scale: Hurts even more Pain Location: abdomen Pain Descriptors / Indicators: Grimacing, Guarding Pain Intervention(s): Monitored during session     Hand Dominance Right   Extremity/Trunk Assessment Upper Extremity Assessment Upper Extremity Assessment: Overall WFL for tasks assessed   Lower Extremity Assessment Lower Extremity Assessment: Defer to PT evaluation   Cervical / Trunk Assessment Cervical / Trunk Assessment: Other exceptions Cervical / Trunk Exceptions: ileostomy   Communication  Communication Communication: No difficulties   Cognition Arousal/Alertness: Awake/alert Behavior During Therapy: WFL for tasks assessed/performed Overall Cognitive Status: Within Functional Limits for tasks assessed                                       General Comments       Exercises     Shoulder Instructions      Home Living Family/patient expects to be discharged to:: Private residence Living Arrangements: Parent;Other relatives Available Help at Discharge: Family Type of Home: House Home Access: Level entry     Home Layout: One level     Bathroom Shower/Tub: Occupational psychologist: Handicapped height Bathroom Accessibility: Yes   Home Equipment: Research scientist (life sciences);Shower seat;Cane - single point Adaptive Equipment: Reacher Additional Comments: reports recent move from [place], [place] to live with family. Reports home is handicapped accessible as he has a brother with cerebral palsy      Prior Functioning/Environment Prior Level of Function : Independent/Modified Independent;Driving;Working/employed             Mobility Comments: walks 2 miles usually daily w/ husky dog          OT Problem List: Pain      OT Treatment/Interventions:      OT Goals(Current goals can be found in the care plan section) Acute Rehab OT Goals Patient Stated Goal: decrease pain, get back home to dog OT Goal Formulation: All assessment and education complete, DC therapy  OT Frequency:      Co-evaluation              AM-PAC OT "6 Clicks" Daily Activity     Outcome Measure Help from another person eating meals?: None Help from another person taking care of personal grooming?: None Help from another person toileting, which includes using toliet, bedpan, or urinal?: None Help from another person bathing (including washing, rinsing, drying)?: None Help from another person to put on and taking off regular upper body clothing?: None Help from  another person to put on and taking off regular lower body clothing?: None 6 Click Score: 24   End of Session Nurse Communication: Mobility status;Patient requests pain meds  Activity Tolerance: Patient limited by pain Patient left: in bed;with call bell/phone within reach  OT Visit Diagnosis: Pain Pain - part of body:  (abdomen)                Time: [id number] OT Time Calculation (min): 21 min Charges:  OT General Charges $OT Visit: 1 Visit OT Evaluation $OT Eval Low Complexity: 1 Low  Malachy Chamber, OTR/L Acute Rehab Services Office: [phone]   Layla Maw 06/07/2022, 9:29 AM

## 2022-06-07 NOTE — Care Management Important Message (Signed)
Important Message  Patient Details  Name: Troy Ramirez MRN: [id number] Date of Birth: Feb 13, 1986   Medicare Important Message Given:  Yes     Tawnia Schirm [address] 06/07/2022, 3:32 PM

## 2022-06-07 NOTE — Progress Notes (Signed)
                 Interval history No new concerns.  Some postoperative abdominal pain.  Normal ostomy output.  Not yet ambulating around the room.  Physical exam Blood pressure (!) 140/87, pulse 94, temperature 97.8 F (36.6 C), temperature source Oral, resp. rate 18, height 6\' 3"  (1.905 m), weight 115.7 kg, SpO2 98 %.  Comfortable appearing Heart rate normal, rhythm regular Breathing is regular and unlabored on room air Some abdominal tenderness around ostomy site, ostomy bag with liquid brown stool Skin is warm and dry Alert and oriented  Assessment and plan Hospital day 3  Montey Finucane is a 37 y.o. with Crohn's disease presents with abdominal pain and admitted for parastomal hernia status post ileostomy revision and hernia repair on 06/06/2022.  Principal Problem:   Parastomal hernia Active Problems:   Peristomal hernia   Ileostomy in place   Crohn's disease of both small and large intestine with complication  Parastomal hernia Stable status post ileostomy revision and hernia repair.  Some postoperative pain, within normal.  Good ostomy output.  Has not done a whole lot of moving around the move unit yet.  Will taper pain meds to oral and hopeful for discharge tomorrow after improved mobility. - Scheduled Tylenol - Oxycodone 5 to 10 mg p.o. every 4 hours as needed for pain  Crohn's disease Status post total abdominal colectomy.  Rectal stump remains.  Hopeful for eventual reversal.  Plan for outpatient follow-up with [facility].  On Stelara.  Right hip pain Chronic and intermittent.  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  Uncertain about prior imaging studies.  May be inflammatory arthritis secondary to Crohn's.  Also wonder if this person who has been on steroids in the past may have some degree of AVN.  Underscores importance of good follow-up after discharge. - Arrange [facility] follow-up prior to DC  Diet: Regular IVF: None VTE: heparin injection 5,000  Units Start: 06/04/22 1400  Code: Full PT/OT recommendations: As needed assistance, progressing well  Discharge plan: Anticipate home tomorrow  Nani Gasser MD 06/07/2022, 6:32 AM  Pager: [phone] After 5pm or weekend: [phone]

## 2022-06-07 NOTE — Progress Notes (Signed)
1 Day Post-Op   Subjective/Chief Complaint: Pt POD 1 from ileostomy revision Looks well sore    Objective: Vital signs in last 24 hours: Temp:  [97.8 F (36.6 C)-98 F (36.7 C)] 98 F (36.7 C) (04/01 0735) Pulse Rate:  [83-107] 83 (04/01 0735) Resp:  [16-18] 16 (04/01 0735) BP: (139-151)/(87-89) 139/89 (04/01 0735) SpO2:  [96 %-98 %] 96 % (04/01 0735) Last BM Date : 06/06/22  Intake/Output from previous day: 03/31 0701 - 04/01 0700 In: 1380 [P.O.:480; I.V.:900] Out: 25 [Blood:25] Intake/Output this shift: Total I/O In: 480 [P.O.:480] Out: 550 [Urine:550]  Incision/Wound: Ostomy site pink and functioning min distention  Lab Results:  Recent Labs    06/06/22 0345 06/07/22 0251  WBC 6.1 10.5  HGB 13.5 12.9*  HCT 40.6 40.0  PLT 295 345   BMET Recent Labs    06/06/22 0345 06/07/22 0251  NA 137 137  K 3.7 3.7  CL 98 101  CO2 25 24  GLUCOSE 94 127*  BUN 7 8  CREATININE 0.79 0.75  CALCIUM 9.3 8.8*   PT/INR No results for input(s): "LABPROT", "INR" in the last 72 hours. ABG No results for input(s): "PHART", "HCO3" in the last 72 hours.  Invalid input(s): "PCO2", "PO2"  Studies/Results: No results found.  Anti-infectives: Anti-infectives (From admission, onward)    Start     Dose/Rate Route Frequency Ordered Stop   06/06/22 0706  ceFAZolin (ANCEF) 2-4 GM/100ML-% IVPB       Note to Pharmacy: Grace Blight M: cabinet override      06/06/22 0706 06/06/22 1914   06/06/22 0700  ceFAZolin (ANCEF) IVPB 2g/100 mL premix  Status:  Discontinued        2 g 200 mL/hr over 30 Minutes Intravenous On call to O.R. 06/06/22 [id number] 06/06/22 0943       Assessment/Plan: s/p Procedure(s): HERNIA REPAIR PARASTOMAL, Illeostomy Revision (N/A) Adv diet  Can go home when medically stable  Ambulate TO HALLS   LOS: 3 days    Turner Daniels MD  06/07/2022

## 2022-06-07 NOTE — Evaluation (Signed)
Physical Therapy Evaluation Patient Details Name: Troy Ramirez MRN: [id number] DOB: [birth year] Today's Date: 06/07/2022  History of Present Illness  Mr Troy Ramirez is a 37 yo M admitted with parastomal hernia and partial obstruction now status post end ileostomy revision and primary repair of parastomal hernia; with a PMH of HTN, obesity, and crohn's disease c/b multiple strictures s/p total abdominal colectomy with end ileostomy.  Clinical Impression   Pt admitted with above diagnosis. Lives at home with family (brother has CP and uses a wc for mobility), in a single-level home with no steps to enter; Prior to admission, pt was able to manage independently, enjoys walking his dog multiple times a day; Presents to PT with postop pain limiting activity tolerance, slow rise to stand, and slow gait today;  Overall min assist for bed mobility, supervision for sit to stand, and modified independence with short distance gait today; I anticipate good progress as he recovers; Pt currently with functional limitations due to the deficits listed below (see PT Problem List). Pt will benefit from skilled PT to increase their independence and safety with mobility to allow discharge to the venue listed below.          Recommendations for follow up therapy are one component of a multi-disciplinary discharge planning process, led by the attending physician.  Recommendations may be updated based on patient status, additional functional criteria and insurance authorization.  Follow Up Recommendations       Assistance Recommended at Discharge PRN  Patient can return home with the following  Assist for transportation;Assistance with cooking/housework    Equipment Recommendations None recommended by PT (ANticipate good progress, and pt has a RW in storage is he needs one)  Recommendations for Other Services  Other (comment) ([facility])    Functional Status Assessment Patient has had a recent decline in  their functional status and demonstrates the ability to make significant improvements in function in a reasonable and predictable amount of time.     Precautions / Restrictions Precautions Precautions: Other (comment) Precaution Comments: ileostomy Restrictions Weight Bearing Restrictions: No      Mobility  Bed Mobility Overal bed mobility: Needs Assistance Bed Mobility: Rolling, Sidelying to Sit Rolling: Supervision Sidelying to sit: Supervision       General bed mobility comments: Cues to move slowly and to use UEs to psh up sidelying to sit to take work of getting up from uncomfortable trunk; moves slowly    Transfers Overall transfer level: Needs assistance Equipment used: Rolling walker (2 wheels) Transfers: Sit to/from Stand Sit to Stand: Supervision           General transfer comment: Stood very slowly from elevated bed, with hands on RW    Ambulation/Gait Ambulation/Gait assistance: Supervision Gait Distance (Feet): 18 Feet (in room) Assistive device: None (but reaching out to bed, and counters for UE support) Gait Pattern/deviations: Step-through pattern, Wide base of support, Trunk flexed       General Gait Details: Reaches out for UE support from foot of bed, counters; opted not to use RW  Stairs            Wheelchair Mobility    Modified Rankin (Stroke Patients Only)       Balance Overall balance assessment: No apparent balance deficits (not formally assessed)  Pertinent Vitals/Pain Pain Assessment Pain Assessment: 0-10 Pain Score: 8  Faces Pain Scale: Hurts even more Pain Location: abdomen Pain Descriptors / Indicators: Grimacing, Guarding Pain Intervention(s): Monitored during session, Premedicated before session    Home Living Family/patient expects to be discharged to:: Private residence Living Arrangements: Parent;Other relatives Available Help at Discharge:  Family Type of Home: House Home Access: Level entry       Home Layout: One level Home Equipment: Adaptive equipment;Shower seat;Cane - single point Additional Comments: reports recent move from [place], [place] to live with family. Reports home is handicapped accessible as he has a brother with cerebral palsy    Prior Function Prior Level of Function : Independent/Modified Independent;Driving;Working/employed             Mobility Comments: walks 2 miles usually daily w/ husky dog       Hand Dominance   Dominant Hand: Right    Extremity/Trunk Assessment   Upper Extremity Assessment Upper Extremity Assessment: Defer to OT evaluation    Lower Extremity Assessment Lower Extremity Assessment: RLE deficits/detail;LLE deficits/detail RLE Deficits / Details: Hip ROM notably limited by trunk discomfort/surgical pain in bed and sitting EOB, tending to be externally rotated and abducted in sitting EOB; adequate strength for slow rise to standing from elevated bed LLE Deficits / Details: Hip ROM notably limited by trunk discomfort/surgical pain in bed and sitting EOB, tending to be externally rotated and abducted in sitting EOB; adequate strength for slow rise to standing from elevated bed    Cervical / Trunk Assessment Cervical / Trunk Assessment: Other exceptions Cervical / Trunk Exceptions: ileostomy  Communication   Communication: No difficulties  Cognition Arousal/Alertness: Awake/alert Behavior During Therapy: WFL for tasks assessed/performed Overall Cognitive Status: Within Functional Limits for tasks assessed                                          General Comments General comments (skin integrity, edema, etc.): Very hesitant to move due to anticipation of pain, but engaged in conversation re: daily life, plans for dc'ing home, etc    Exercises     Assessment/Plan    PT Assessment Patient needs continued PT services  PT Problem List Decreased  strength;Decreased range of motion;Decreased activity tolerance;Decreased balance;Decreased mobility;Pain       PT Treatment Interventions DME instruction;Gait training;Stair training;Functional mobility training;Therapeutic activities;Therapeutic exercise;Balance training;Patient/family education    PT Goals (Current goals can be found in the Care Plan section)  Acute Rehab PT Goals Patient Stated Goal: Get back to walking his dog PT Goal Formulation: With patient Time For Goal Achievement: 06/14/22 Potential to Achieve Goals: Good    Frequency Min 3X/week     Co-evaluation               AM-PAC PT "6 Clicks" Mobility  Outcome Measure Help needed turning from your back to your side while in a flat bed without using bedrails?: A Little Help needed moving from lying on your back to sitting on the side of a flat bed without using bedrails?: A Little Help needed moving to and from a bed to a chair (including a wheelchair)?: A Little Help needed standing up from a chair using your arms (e.g., wheelchair or bedside chair)?: None Help needed to walk in hospital room?: None Help needed climbing 3-5 steps with a railing? : A Little 6 Click Score: 20  End of Session   Activity Tolerance: Patient tolerated treatment well (but gait distance limited by pain) Patient left: in chair;with call bell/phone within reach Nurse Communication: Mobility status PT Visit Diagnosis: Unsteadiness on feet (R26.81);Other abnormalities of gait and mobility (R26.89);Pain Pain - Right/Left:  (grossly center) Pain - part of body:  (trunk and abdomen)    Time: [id number] PT Time Calculation (min) (ACUTE ONLY): 28 min   Charges:   PT Evaluation $PT Eval Low Complexity: 1 Low PT Treatments $Gait Training: 8-22 mins        Roney Marion, PT  Acute Rehabilitation Services Office [phone]   Colletta [place] 06/07/2022, 12:23 PM

## 2022-06-07 NOTE — TOC Initial Note (Signed)
Transition of Care [facility]) - Initial/Assessment Note    Patient Details  Name: Troy Ramirez MRN: [id number] Date of Birth: [birth year]  [facility] CM/SW Contact:    Sharin Mons, RN Phone Number: 06/07/2022, 11:23 AM  Clinical Narrative:                     - s/p End ileostomy revision with primary repair of parastomal hernia, 3/28  NCM @ bedside to discuss d/c planning needs. Pt states from with family ( mom/ brother). States recently moved from [place], [place] in Aug. 2023 to [place]. PTA independent with ADL's. Owns a cane and walker. States without PCP, however, states [facility] to follow @ d/c. Pt without transportation issues or RX med concerns. Pt states has cared for ostomy without problems and has no problems obtaining ostomy supply.  TOC team following and will assist with needs....   Expected Discharge Plan: Home/Self Care Barriers to Discharge: Continued Medical Work up   Patient Goals and CMS Choice            Expected Discharge Plan and Services                                              Prior Living Arrangements/Services   Lives with:: Parents Patient language and need for interpreter reviewed:: Yes Do you feel safe going back to the place where you live?: Yes      Need for Family Participation in Patient Care: Yes (Comment) Care giver support system in place?: Yes (comment)   Criminal Activity/Legal Involvement Pertinent to Current Situation/Hospitalization: No - Comment as needed  Activities of Daily Living Home Assistive Devices/Equipment: None ADL Screening (condition at time of admission) Patient's cognitive ability adequate to safely complete daily activities?: Yes Is the patient deaf or have difficulty hearing?: No Does the patient have difficulty seeing, even when wearing glasses/contacts?: No Does the patient have difficulty concentrating, remembering, or making decisions?: No Patient able to express need for  assistance with ADLs?: No Does the patient have difficulty dressing or bathing?: No Independently performs ADLs?: Yes (appropriate for developmental age) Does the patient have difficulty walking or climbing stairs?: No Weakness of Legs: None Weakness of Arms/Hands: None  Permission Sought/Granted   Permission granted to share information with : Yes, Verbal Permission Granted  Share Information with NAME: [place]  Mother  [phone]           Emotional Assessment Appearance:: Appears stated age Attitude/Demeanor/Rapport: Engaged Affect (typically observed): Accepting Orientation: : Oriented to Self, Oriented to Place, Oriented to  Time, Oriented to Situation Alcohol / Substance Use: Not Applicable Psych Involvement: No (comment)  Admission diagnosis:  Peristomal hernia [K46.9] Patient Active Problem List   Diagnosis Date Noted   Crohn's disease of both small and large intestine with complication [id number]   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   Parastomal hernia 06/04/2022   Peristomal hernia 06/04/2022   Ileostomy in place 06/04/2022   Anemia, iron deficiency    GERD (gastroesophageal reflux disease) 07/09/2015   HTN (hypertension) 05/19/2015   Depression 05/03/2014   Crohn's ileocolitis 05/01/2014   Hematochezia 05/01/2014   Arthritis associated with another disorder 04/29/2014   Noncompliance with medication regimen 04/29/2014   Crohn's colitis 04/29/2014   Crohn's disease of colon with abscess 04/29/2014   Crohn's disease 04/19/2014   Malnutrition of  moderate degree 04/13/2014   Hypokalemia 04/11/2014   Exacerbation of Crohn's disease ([place]) 04/11/2014   Anemia of chronic disease    Abdominal pain 08/27/2013   PCP:  Pcp, No Pharmacy:   [facility] [place] [place] [address] Phone: [phone] Fax: [place]  [address] [place] [address] Phone: [phone] Fax: [place], [place] [place]. [place] [address] Phone: [phone] Fax: [phone]     Social Determinants of Health (SDOH) Social History: [place]: No Food Insecurity (06/04/2022)  Housing: Low Risk  (06/04/2022)  Transportation Needs: No Transportation Needs (06/04/2022)  Utilities: Not At Risk (06/04/2022)  Tobacco Use: Low Risk  (06/06/2022)   SDOH Interventions:     Readmission Risk Interventions     No data to display

## 2022-06-07 NOTE — Telephone Encounter (Signed)
Script sent to pharmacy. PA team notified in case med needs PA.

## 2022-06-08 ENCOUNTER — Other Ambulatory Visit (HOSPITAL_COMMUNITY): Payer: Self-pay

## 2022-06-08 ENCOUNTER — Telehealth: Payer: Self-pay

## 2022-06-08 DIAGNOSIS — K435 Parastomal hernia without obstruction or  gangrene: Secondary | ICD-10-CM | POA: Diagnosis not present

## 2022-06-08 LAB — SURGICAL PATHOLOGY

## 2022-06-08 MED ORDER — OXYCODONE-ACETAMINOPHEN 5-325 MG PO TABS
1.0000 | ORAL_TABLET | Freq: Four times a day (QID) | ORAL | Status: DC | PRN
  Administered 2022-06-08: 1 via ORAL
  Filled 2022-06-08: qty 1

## 2022-06-08 MED ORDER — OXYCODONE HCL 5 MG PO TABS
5.0000 mg | ORAL_TABLET | Freq: Four times a day (QID) | ORAL | Status: DC | PRN
  Administered 2022-06-08: 5 mg via ORAL
  Filled 2022-06-08: qty 1

## 2022-06-08 MED ORDER — ACETAMINOPHEN 325 MG PO TABS
650.0000 mg | ORAL_TABLET | Freq: Four times a day (QID) | ORAL | Status: DC
  Administered 2022-06-08: 650 mg via ORAL
  Filled 2022-06-08: qty 2

## 2022-06-08 MED ORDER — ACETAMINOPHEN 325 MG PO TABS: 650.0000 mg | ORAL_TABLET | Freq: Four times a day (QID) | ORAL | Status: AC

## 2022-06-08 MED ORDER — OXYCODONE-ACETAMINOPHEN 10-325 MG PO TABS
0.5000 | ORAL_TABLET | Freq: Four times a day (QID) | ORAL | 0 refills | Status: AC | PRN
  Filled 2022-06-08: qty 12, 3d supply, fill #0

## 2022-06-08 NOTE — Progress Notes (Cosign Needed Addendum)
   Progress Note  2 Days Post-Op  Subjective: Pain improving, worse with coughing. Discussed abdominal binder. Having output and tolerating diet   Objective: Vital signs in last 24 hours: Temp:  [97.9 F (36.6 C)-98.6 F (37 C)] 98.6 F (37 C) (04/02 0746) Pulse Rate:  [90-95] 90 (04/02 0746) Resp:  [17] 17 (04/02 0746) BP: (142-159)/(88-101) 148/101 (04/02 0746) SpO2:  [96 %-99 %] 99 % (04/02 0746) Last BM Date : 06/07/22  Intake/Output from previous day: 04/01 0701 - 04/02 0700 In: 720 [P.O.:720] Out: 550 [Urine:550] Intake/Output this shift: Total I/O In: 240 [P.O.:240] Out: -   PE: General: pleasant, WD, WN male who is laying in bed in NAD Abd: soft, appropriately ttp, stoma viable with stool in appliance, ND Psych: A&Ox3 with an appropriate affect.    Lab Results:  Recent Labs    06/06/22 0345 06/07/22 0251  WBC 6.1 10.5  HGB 13.5 12.9*  HCT 40.6 40.0  PLT 295 345   BMET Recent Labs    06/06/22 0345 06/07/22 0251  NA 137 137  K 3.7 3.7  CL 98 101  CO2 25 24  GLUCOSE 94 127*  BUN 7 8  CREATININE 0.79 0.75  CALCIUM 9.3 8.8*   PT/INR No results for input(s): "LABPROT", "INR" in the last 72 hours. CMP     Component Value Date/Time   NA 137 06/07/2022 0251   K 3.7 06/07/2022 0251   CL 101 06/07/2022 0251   CO2 24 06/07/2022 0251   GLUCOSE 127 (H) 06/07/2022 0251   BUN 8 06/07/2022 0251   CREATININE 0.75 06/07/2022 0251   CREATININE 0.61 05/19/2015 1533   CALCIUM 8.8 (L) 06/07/2022 0251   PROT 9.4 (H) 06/03/2022 1946   ALBUMIN 4.3 06/03/2022 1946   AST 39 06/03/2022 1946   ALT 48 (H) 06/03/2022 1946   ALKPHOS 61 06/03/2022 1946   BILITOT 1.0 06/03/2022 1946   GFRNONAA >60 06/07/2022 0251   GFRNONAA >89 05/19/2015 1533   GFRAA >60 09/17/2015 2230   GFRAA >89 05/19/2015 1533   Lipase     Component Value Date/Time   LIPASE 33 06/03/2022 1946       Studies/Results: No results found.  Anti-infectives: Anti-infectives (From  admission, onward)    Start     Dose/Rate Route Frequency Ordered Stop   06/06/22 0706  ceFAZolin (ANCEF) 2-4 GM/100ML-% IVPB       Note to Pharmacy: [facility]: cabinet override      06/06/22 0706 06/06/22 1914   06/06/22 0700  ceFAZolin (ANCEF) IVPB 2g/100 mL premix  Status:  Discontinued        2 g 200 mL/hr over 30 Minutes Intravenous On call to O.R. 06/06/22 0650 06/06/22 0943        Assessment/Plan POD2 s/p revision of end ileostomy with primary repair of parastomal hernia  - WOC following, referral made to ostomy clinic  - abdominal binder - tolerating diet and having output - stable for discharge from general surgery standpoint - follow up GI as outpatient   FEN: reg diet VTE: LMWH ID: ancef pre-op   LOS: 4 days     Norm Parcel, [facility] 06/08/2022, 10:05 AM Please see Amion for pager number during day hours 7:00am-4:30pm

## 2022-06-08 NOTE — Telephone Encounter (Signed)
Script sent to pharmacy and PA info sent to [facility] for PA. Pt scheduled to see Dr. Hilarie Fredrickson 09/14/22 at 8:50am. Appt letter mailed to pt.

## 2022-06-08 NOTE — Telephone Encounter (Signed)
-----   Message from Jerene Bears, MD sent at 06/06/2022 12:21 PM EDT ----- Vaughan Basta,  This patient has Crohn's disease and will reestablish with us/me after hospital discharge Please prescribe Stelara 90 mg every 4 weeks (note the shorter than usual interval).  This is his current dose. Due dose 06/21/22 Routine followup with me, does not need to be urgent JMP

## 2022-06-08 NOTE — Consult Note (Addendum)
[place] Nurse ostomy re-consult note Refer to [place] note from yesterday.  Requested to provide ostomy supplies prior to discharge. Supplies ordered yesterday never arrived. I provided 4 sets of supplies for patient use after discharge: use supplies: barrier ring, Kellie Simmering # [phone], wafer Kellie Simmering # 2, pouch Lawson # [phone]. I also provided an ostomy belt and demonstrated use.  Pt has an abd binder in the room and I demonstrated use and how to cut an opening for the pouch.  He verbalized understanding.  He states he is independent with pouch emptying and changing and denies need for assistance or further questions. Current pouch is intact with good seal, mod amt liquid tan stool, stoma is red and viable and above skin level when visualized through the pouch. Please re-consult if further assistance is needed.  Thank-you,  Julien Girt MSN, [place], [place], [place], [place]

## 2022-06-08 NOTE — Discharge Summary (Signed)
Name: Troy Ramirez MRN: [id number] DOB: 27-Sep-1985 37 y.o. PCP: Pcp, No  Date of Admission: 06/03/2022  7:39 PM Date of Discharge: 06/08/2022 Attending Physician: Dr. Evette Doffing  Discharge Diagnosis: Principal Problem:   Parastomal hernia without obstruction or gangrene Active Problems:   Ileostomy in place   Crohn's disease of both small and large intestine with complication    Discharge Medications: Allergies as of 06/08/2022       Reactions   Humira (2 Pen) [adalimumab] Anaphylaxis, Swelling   Itching, swelling  lips        Medication List     STOP taking these medications    MAGNESIUM OXIDE PO   pantoprazole 40 MG tablet Commonly known as: PROTONIX       TAKE these medications    acetaminophen 325 MG tablet Commonly known as: TYLENOL Take 2 tablets (650 mg total) by mouth every 6 (six) hours. What changed:  medication strength how much to take when to take this reasons to take this   oxyCODONE-acetaminophen 10-325 MG tablet Commonly known as: Percocet Take 0.5-1 tablets by mouth every 6 (six) hours as needed for up to 3 days for pain.   Stelara 90 MG/ML Sosy injection Generic drug: ustekinumab Inject 1 mL (90 mg total) into the skin every 28 (twenty-eight) days.        Disposition and follow-up:   Troy Ramirez was discharged from [facility] in Stable condition.  At the hospital follow up visit please address:  1.  Follow-up:  A. Ensure follow-up with GI scheduled for 7/24 and that patient has Stelara until then.   B. General surgery follow-up and getting abdominal binder  C. Chronic right hip pain  2.  Labs / imaging needed at time of follow-up: none  3.  Pending labs/ test needing follow-up: none  4.  Medication Changes Oxycodone-Acetaminophen 10-325 mg q 6 hrs PRN Acetaminophen 650 mg q 6 hrs  Follow-up Appointments:  Follow-up Information     Dwan Bolt, MD. Go on 07/07/2022.   Specialty: General  Surgery Why: 10:30 AM, please arrive 30 min prior to appointment time. Contact information: [address] [place] [address] [phone]         Nani Gasser, MD. Daphane Shepherd on 06/22/2022.   Why: Please arrive 15 minutes early for your appointment at 8:45 a.m. Contact information: [place] [address] [place] [facility] Course by problem list: Principal Problem:   Parastomal hernia Active Problems:   Peristomal hernia   Ileostomy in place   Crohn's disease of both small and large intestine with complication  Resolved Problems:   * No resolved hospital problems. *  Parastomal hernia Patient presented with a a week of worsening abdominal pain and non-reducible hernia.  He was hemodynamically stable on admission.  CT abdomen pelvis showed parastomal hernia containing fat and small bowel without evidence of proximal obstruction as well as mild wall thickening and fluid-filled terminal ileum.  Surgery was consulted for surgical management.  With CT findings of possible ileitis GI was also consulted.  They did not think current episode was consistent with Crohn's flare and recommended surgical management.  Troy Ramirez underwent ileostomy revision and repair of parastomal hernia on 3/31.  He will follow-up with general surgery on 5/1 with Dr. [name].   Crohn's disease complicated by ileal strictures status post total abdominal colectomy with  end ileostomy in 2021 Troy Ramirez was diagnosed with Crohn's in 2005.  He received most of his care at [facility], but recently relocated back to [place].  He was seen by [facility] during admission and will follow-up with them in July 2024.  His Crohn's has been well-managed on Stelara every 4 weeks.  He has 3 pens remaining at home.  Dr. Hilarie Fredrickson prescribed additional Stelara.  Patient's ultimate goal is for ileostomy reversal.  Right hip pain With chronic and intermittent right hip pain.  This has  previously been evaluated at [facility], although uncertain of prior imaging studies.  Differentials include inflammatory arthritis secondary to Crohn's.  With his history of chronic steroid use, avascular necrosis could also be a possibility.    Discharge Subjective: Patient evaluated at bedside this AM.  He continues to have pain at surgical site, but pain medications make tolerable so that he can ambulate. He has good output from new stoma.   Discharge Exam:   BP (!) 148/101 (BP Location: Left Arm)   Pulse 90   Temp 98.6 F (37 C) (Oral)   Resp 17   Ht 6\' 3"  (1.905 m)   Wt 115.7 kg   SpO2 99%   BMI 31.87 kg/m  Appears comfortable regular rate and rhythm, no m/r/g normal work of breathing on room air, lungs clear to auscultation bilaterally soft, mild tenderness with light palpation, non-distended, stoma is well-perfused normal bulk and tone  Pertinent Labs, Studies, and Procedures:     Latest Ref Rng & Units 06/07/2022    2:51 AM 06/06/2022    3:45 AM 06/05/2022    4:02 AM  CBC  WBC 4.0 - 10.5 K/uL 10.5  6.1  6.3   Hemoglobin 13.0 - 17.0 g/dL 12.9  13.5  13.4   Hematocrit 39.0 - 52.0 % 40.0  40.6  40.5   Platelets 150 - 400 K/uL 345  295  282        Latest Ref Rng & Units 06/07/2022    2:51 AM 06/06/2022    3:45 AM 06/05/2022    4:02 AM  CMP  Glucose 70 - 99 mg/dL 127  94  85   BUN 6 - 20 mg/dL 8  7  9    Creatinine 0.61 - 1.24 mg/dL 0.75  0.79  0.71   Sodium 135 - 145 mmol/L 137  137  133   Potassium 3.5 - 5.1 mmol/L 3.7  3.7  4.0   Chloride 98 - 111 mmol/L 101  98  100   CO2 22 - 32 mmol/L 24  25  22    Calcium 8.9 - 10.3 mg/dL 8.8  9.3  9.0     CT ABDOMEN PELVIS W CONTRAST  Result Date: 06/04/2022 CLINICAL DATA:  Acute nonlocalized abdominal pain. Lower abdominal pain with diarrhea and nausea for several days. EXAM: CT ABDOMEN AND PELVIS WITH CONTRAST TECHNIQUE: Multidetector CT imaging of the abdomen and pelvis was performed using the standard protocol following bolus  administration of intravenous contrast. RADIATION DOSE REDUCTION: This exam was performed according to the departmental dose-optimization program which includes automated exposure control, adjustment of the mA and/or kV according to patient size and/or use of iterative reconstruction technique. CONTRAST:  14mL OMNIPAQUE IOHEXOL 350 MG/ML SOLN COMPARISON:  07/09/2015 FINDINGS: Lower chest: Lung bases are clear. Hepatobiliary: No focal liver abnormality is seen. No gallstones, gallbladder wall thickening, or biliary dilatation. Pancreas: Unremarkable. No pancreatic ductal dilatation or surrounding inflammatory changes. Spleen: Normal in size without focal abnormality.  Adrenals/Urinary Tract: Adrenal glands are unremarkable. Kidneys are normal, without renal calculi, focal lesion, or hydronephrosis. Bladder is unremarkable. Stomach/Bowel: Colectomy with right lower quadrant ileostomy and sigmoid stump. Peristomal hernia containing fat and ileum without evidence of proximal obstruction. Mild wall thickening with fluid-filled distal ileum may indicate ileitis. Small bowel are mostly decompressed. Stomach and rectosigmoid stump are unremarkable. Vascular/Lymphatic: No significant vascular findings are present. No enlarged abdominal or pelvic lymph nodes. Reproductive: Prostate is unremarkable. Other: No free air or free fluid in the abdomen. Musculoskeletal: No acute or significant osseous findings. IMPRESSION: 1. Prior colectomy with right lower quadrant ileocolostomy and sigmoid stump. 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 2. Mild wall thickening and fluid-filled terminal ileum extending into the ileostomy may indicate ileitis. 3. Rectosigmoid stump appears intact and decompressed. Electronically Signed   By: Lucienne Capers M.D.   On: 06/04/2022 03:07     Discharge Instructions: Discharge Instructions     Ambulatory referral to Wound Clinic   Complete by: As  directed    Call MD for:  persistant nausea and vomiting   Complete by: As directed    Call MD for:  redness, tenderness, or signs of infection (pain, swelling, redness, odor or green/yellow discharge around incision site)   Complete by: As directed    Call MD for:  severe uncontrolled pain   Complete by: As directed    Call MD for:  temperature >100.4   Complete by: As directed    Diet - low sodium heart healthy   Complete by: As directed    Increase activity slowly   Complete by: As directed        Signed: Christiana Fuchs, DO 06/08/2022, 4:46 PM   Pager: [phone]

## 2022-06-08 NOTE — Progress Notes (Signed)
Discharge summary packet provided to pt with instructions, Pt verbalized understanding of instructions. Pt d/c to home as ordered. Ostomy supplies provided by [facility] nurse, TOC meds given to pt.,he remains alert/oriented in no apparent distress. No complaints.Family is responsible for pt's ride.

## 2022-06-08 NOTE — Discharge Instructions (Addendum)
[name],  Troy Ramirez were evaluated and treated in the hospital for:  Active Problems:   Ileostomy in place   Crohn's disease of both small and large intestine with complication  Principal Problem (Resolved):   Parastomal hernia  The evaluation suggested a hernia around the site of the stoma. Troy Ramirez were treated with a surgery to repair the hernia and supportive care including pain management.  Troy Ramirez were discharged from the hospital on 06/08/22. I recommend the following after leaving the hospital:   Troy Ramirez may continue taking pain medicine for 3 days after leaving the hospital.  If there is uncontrolled pain after that, follow-up with the doctors that did your surgery.  Be aware of signs of infection including worsening rather than improving pain around site of incision, increased redness or warmth, increased drainage, fevers, or new or worsening fatigue.  Increase your activity when you leave the hospital.  Try to resume your usual routine when you arrive home.  We have arranged follow-up with the [facility] health internal medicine center for your primary care needs.  Troy Gasser MD 06/08/2022, 7:47 AM         Ileostomy Nutrition Therapy   During ileostomy surgery, the entire colon, rectum, and anus are removed or bypassed. The part of the small intestine called the ileum is brought through the abdominal wall, creating a stoma. The stoma is an opening in the abdomen that must be covered with a bag to collect stools at all times. It can be temporary or permanent depending on the surgery.  After surgery, your bowel will be swollen. Your eating plan will begin with a diet of clear liquids. As you recover, you will start eating solid foods, beginning with foods that are low in fiber (see Recommended Foods). You should avoid high-fiber foods because Troy Ramirez are harder to digest. Avoiding high-fiber foods will allow the bowel to heal and prevent blockage of the  ileostomy.  You should have less than 8 grams of fiber per day when you move from liquids to solid foods and then transition to less than 13 grams of fiber for the whole day in the next few days as your symptoms decrease. Most patients begin to eat more normally 6 weeks after surgery.    The changes to your diet recommended on this handout can help reduce symptoms such as diarrhea, odor, and gas; help you avoid a blockage; and help your body get more nutrients from your food as you heal from surgery.   Tips   You may not feel like eating much, so eat small amounts every 2 to 4 hours. Keep a regular schedule for meals and snacks to help reduce gas and help your body absorb nutrients from foods.  Let your health care provider know if you see whole foods or pills in your ostomy bag.  Do not use time-released, enteric-coated medications or very large tablets. Also avoid laxatives, which can cause dehydration.  Take a chewable (non-gummy) multivitamin with minerals daily.  Take a chewable or liquid calcium supplement. Liquid calcium citrate can be taken with food or without food.  Have your largest meal in the middle of the day. Do not eat large amounts in the evening. This can help decrease stool output at night, so that you can limit emptying the ostomy bag then.  Eat foods that may thicken stool several times a day. Some foods can change the color of the stool. (See the Food Selection Guide.)  When you begin to add more variety back into your diet, add only 1 new food every few days. If there are foods that bothered you before surgery, add other foods first. Eat only a small amount when you re-try foods. If a food makes you feel sick, wait a few weeks and then re-try it. Keep a log of foods you try and how you feel when eating them.  To reduce gas, avoid chewing gum, drinking with straws and drinking carbonated beverages, smoking or chewing tobacco, eating too fast, and skipping meals. Missing meals  can increase gas and watery stools.   Some foods may cause blockages (see Food Selection Guide). Use caution when eating these foods. Eat small amounts and chew your foods well to prevent blockage.  Get enough fluids. Aim for at least 8 to 10 cups of liquid per day. Drink liquids 30 minutes after meals or snacks to avoid flushing foods through your system too quickly.  During times of higher output (1,800 milliliters per day or more) or heavy sweating, you need to drink more fluids. You may need to actually measure how much you are drinking and your output from your ostomy when it is high:   1 ounce is 30 milliliters o 1 cup is 8 ounces  8 cups is 64 ounces or 2 quarts or 2 liters  Watch for signs and symptoms of fluid-electrolyte imbalance. If symptoms occur, seek treatment right away. Symptoms include:   Dry mouth  Reduced urine output (not as much urine or urinating less often than normal)  Dark-colored urine  Feeling dizzy when you stand up  Noticeable fatigue (feeling extremely tired)  Abdominal cramping  If you have a high output ostomy, you may need to use an oral rehydration solution (ORS) to replace fluid loss. The World Health Organization has a solution in powder form you can buy (called Oral Rehydration Salts). Some sports drinks can increase stoma output, so pediatric electrolyte solutions, such as Pedialyte, are recommended instead. A less expensive option is to make the oral rehydration solution yourself using one of the following recipes:   2 cups Gatorade + 2 cups water +  teaspoon salt  3 cups water + 1 cup orange juice +  teaspoon salt +  teaspoon baking soda o  cup grape juice or cranberry juice + 3 cups water +  teaspoon salt o 1 cup apple juice + 3 cups water +  teaspoon salt  4 cups (1 liter) water +  teaspoon table salt + 6 level teaspoons sugar (World Health Organization's ORS recipe)  Your registered dietitian nutritionist or doctor may suggest increasing foods  that are higher in sodium and potassium. Remember to choose lower fiber foods that are high in potassium. Some examples of high-potassium foods include soy milk, yogurt, cottage cheese, potatoes without skin, liquid supplements (such as Ensure Muscle Health), orange juice or tomato juice (if these do not cause reflux or other symptoms), smooth peanut butter, and baked or broiled salmon or [place]. Salt substitute that contains potassium chloride can also be used, but be careful not to overuse it. Higher-sodium foods added to the diet should also be lower fiber and not fried.   Recommended Foods  If particular foods make you feel unwell, stop eating them and try again in 2 to 3 weeks.  Everyone's tolerance to foods is different. Finding foods that are best for you may require some trial and error.   Dairy Foods  Recommended Foods  Notes  Fat-free (skim) or low-fat (1%) milk*  Soy milk, rice milk, or almond milk  Lactose-free milk  Yogurt*  Powdered milk  Cheese*  Buttermilk  Low-fat ice cream* or sherbet  If you feel unwell after drinking milk or eating dairy foods, try lactose-free products. Foods marked with an asterisk (*) on this list have lactose.  Aged cheeses (such as cheddar and Swiss) are lower in lactose.  Check labels for calcium content of almond milk and rice milk to make sure Troy Ramirez have 30% calcium. These beverages are not high in protein, so include other high-protein foods at meals and snacks to support healing.  Soy milk may cause gas and bloating for some people. It is best to avoid soy milk if it causes symptoms.    Protein Foods  Recommended Foods  Notes   Very tender, well-cooked meats and poultry prepared without added fat  Fish  Smooth nut butter (limit to 1 to 2 tablespoons a day)  Eggs (scrambled eggs are easiest to digest)  When trying nuts, fish, and eggs, start with small amounts. These foods may cause odors.  Use a moist heating method for meats and poultry:    Use water or broth to cook the meat or poultry at a lower temperature.  Cover the dish when cooking in the oven, so the food cooks in its own juices.    Marinate meat first with an acidic ingredient, such as vinegar, lemon juice, or wine, and some oil or by using chopped raw pineapple, which has natural enzymes. Pour off the marinade before cooking.     Grains  Recommended Foods  Notes   Grain foods made from white or refined flour, including bread, bagels, rolls; crackers; pasta; and cereal  White rice  Cream of wheat or cream of rice  Refined grits  Cereals made from refined grains, without added fiber, such as rice chex or cornflakes  Choose grain foods with less than 2 grams of fiber per serving. The grams of dietary fiber in 1 serving are listed on the Nutrition Facts label of packaged foods.  Read labels if you have problems with lactose. Any foods containing milk may contain lactose.    Vegetables  Recommended Foods  Notes   Well-cooked vegetables without seeds or skins, such as green beans or carrots  Potatoes without skin  Shredded lettuce on sandwich  Strained vegetable juice  Some vegetables may cause gas, blockages, or odors for some people. See the Food Selection Guide for foods that may cause symptoms.    Fruits  Recommended Foods  Notes   Pulp-free fruit juices (except prune juice)  Ripe banana  Soft melons (watermelon or honeydew)  Peeled and cooked apple  Canned fruits (except pineapple), but avoid heavy syrup, which can make diarrhea worse  These can be added later with doctor's OK to increase fiber:  Some fruits may cause blockages. See the Food Selection Guide for foods that may cause symptoms.  Look for 100% fruit juices. These may need to be diluted in half or used to make oral rehydration solutions to be tolerated well.   ?  ?  Avocado  Orange or grapefruit without  membrane  Grapefruit and grapefruit juice should not be eaten when taking statin-type  medications or budesonide (Entocort).    Fats   Recommended Foods  Notes   Any; olive oil and canola oil are good choices for heart health.  Start with very small amounts. Limit fats and oils to less than  8 teaspoons per day. Fats may cause symptoms or discomfort.  Spreads, butter contain lactose.    Beverages  Recommended Beverages  Notes   Water  Decaffeinated coffee or tea  Noncarbonated beverages  Rehydration beverages  Carbonated beverages may cause gas.     Foods Not Recommended    When you first start eating solid foods, avoid foods that are high in fiber, such as whole grains, dried beans, and most raw vegetables and fruits. (See Foods Recommended list for low-fiber foods. See Food Selection Guide lists for foods that cause symptoms.) ? Avoid acidic, spicy, fried, and greasy foods and foods high in sugar.  Limit food and drinks that contain sugar substitutes. Often diluted sugar-containing drinks do better. Cut fruit juice in half by adding an equal amount of water or more.  While you heal, avoid any foods that cause you to have odor, gas, diarrhea, or obstruction.  You should not have any salad or raw vegetables.  If you have kidney stones, you should try to avoid foods that are high in oxalate. Your RDN may give you a more detailed list of high-oxalate foods if you are at risk. Some foods that are high in oxalate include:   Grains: Wheat bran, wheat germ, whole wheat flour   Protein Foods: Beans, tofu, nuts  Vegetables: Beets, dark leafy greens, sweet potato  Beverages: Beer, cocoa, instant tea, instant coffee  Other: Carob, chocolate    Food Selection Guide for Ileostomy Patients   Foods That May Cause Blockage   Apples, unpeeled  Bean sprouts  Cabbage, raw  Casing on sausage  Celery  Chinese vegetables  Coconut  Coleslaw  Corn  Popcorn  Relishes and olives  Salad greens  Seeds and nuts  Spinach Tough, fibrous meats (for example, steak on grill)  Vegetable and  fruit skins  Whole grains  Cucumbers  Dried fruit, raisins  Grapes  Green peppers  Mushrooms  Nuts  Peas  Pickles  Pineapple      Foods That May Cause Gas or Odor  Alcohol  Apples  Asparagus  Bananas  Beer  Broccoli  Brussels sprouts  Cabbage  Carbonated beverages  Cauliflower  Cheese, some types  Corn  Cucumber  Dairy products  Dried beans and peas  Eggs  Fatty foods  Fish  Grapes  Green pepper  Melons  Onions  Peanuts  Prunes  Radishes  Turnips     Foods That May Help Relieve Gas and Odor  Buttermilk  Cranberry juice  Parsley  Yogurt with active cultures      Diarrhea   Initially after surgery, the stool will be liquid and watery because of where the ileostomy is located in the intestine. The stool will gradually become thicker over the next few weeks (more like a consistency of pudding or oatmeal). There are some foods that can help make the stool thicker or thinner, but if changes to your diet do not improve stools, then your doctor may be able to recommend some medications to help.   Foods That May Cause Diarrhea (looser or more frequent stool)  Alcohol (including beer)  Apricots (and stone fruits)  Beans, baked or legumes  Bran  Broccoli  Brussels sprouts  Cabbage  Caffeinated drinks  (especially hot)  Chocolate  Corn  Fried meats, fish poultry  Fruit juice: apple, grape, orange  Fruit: fresh, canned, or dried  Glucose-free foods containing mannitol or  sorbitol  Gum, sugar free   High-fat foods  High-sugar foods  Licorice  Milk and dairy foods  Nuts or seeds  Peaches (stone fruit)  Peas  Plums (stone fruit)  Prune juice or prunes  Soup  Spicy foods  Sugar-free substitutes  Tomatoes  Turnip greens/green leafy vegetables, raw  Wheat/whole grains  Wine    Foods That May Help Thicken Stool  Applesauce   Bananas   Barley (when OK to have fiber)   Cheese   Marshmallows   Oatmeal (when OK to have fiber)  Pasta (sauces may  increase symptoms)  Peanut butter, creamy  Potatoes, no skin  Pretzels  Saltines  Tapioca  White bread (not high fiber)  White rice, boiled  Yogurt   Foods That May Discolor Stool   Turn stool red  Darken stool  Beets  Asparagus  Foods with red dye  Broccoli   Spinach      Ileostomy Sample 1-Day Menu  Note: It may help to not have beverages with meals or snacks, waiting 30 minutes before drinking. Fiber is rounded to the nearest 0.5g and listed in parentheses ( ) after foods that contain fiber.   Meal  Menu  Total Fiber   Breakfast  1 scrambled egg plain or with  cup low-fat, low-lactose cheese  English muffin or white toast (1.5 grams fiber) with 1 teaspoon margarine   cup unsweetened applesauce (1.5 grams fiber)  3.0 grams   30 minutes after breakfast  4 ounces cranberry grape juice diluted with 4 ounces water   1 cups (12 ounces) decaf tea     Lunch   2 ounces shaved or very thinly sliced roast beef with juices   cup mashed potatoes (1.5 grams fiber) (made with lactose-free milk or chicken broth and olive oil or very small amount of butter)   cup green beans, well-cooked (2.0 grams fiber)  1 cup canned peaches, extra light syrup (1.0 gram fiber)  4.5 grams   30 minutes after lunch  1 cup (8 ounces) fat-free, soy, or lactose-free milk     Snack  8 ounces yogurt without fruits or nuts  1 ripe banana (3.0 grams fiber)    3.0 grams   30 min after snack  1 cup (8 ounces) sport drink or Pedialyte or Oral Rehydration Solution (ORS) if needed     Dinner  [place] sandwich: 2 ounces [place], 1 ounce Swiss cheese,  2 slices white bread (1.0 gram fiber)  2 ounces pretzels (1.0 gram fiber)  2.0 grams   30 minutes after dinner  1 cups water or Gatorade/Pedialyte or ORS     Snack   6 saltine crackers (0.5 grams fiber)  1 ounce low-fat cheddar cheese or Swiss  0.5 grams   30 minutes after snack  1 cup (8 ounces) fat-free, soy, or lactose-free milk         13 grams fiber daily

## 2022-06-15 ENCOUNTER — Other Ambulatory Visit (HOSPITAL_COMMUNITY): Payer: Self-pay

## 2022-06-16 NOTE — Telephone Encounter (Signed)
Left message for pt to call back to provide insurance information/drug coverage.

## 2022-06-17 ENCOUNTER — Other Ambulatory Visit: Payer: Self-pay

## 2022-06-17 MED ORDER — STELARA 90 MG/ML ~~LOC~~ SOSY: 90.0000 mg | PREFILLED_SYRINGE | SUBCUTANEOUS | 6 refills | Status: AC

## 2022-06-17 NOTE — Telephone Encounter (Signed)
Pt called back and states that he is still approved for his stelara just needed a new script sent to [facility] pharmacy and they will ship it out to him within a few days. Spoke with [employer] and she requested order be sent electronically. Script sent in for Stelara. He should have med in time for next dose on 06/21/22.

## 2022-06-18 ENCOUNTER — Ambulatory Visit (HOSPITAL_COMMUNITY): Payer: 59 | Admitting: Nurse Practitioner

## 2022-06-22 ENCOUNTER — Ambulatory Visit: Payer: 59 | Admitting: Student

## 2022-06-22 NOTE — Progress Notes (Deleted)
Subjective:  Mr. Troy Ramirez is a 37 y.o. who presents to clinic for the following:  No chief complaint on file.   ***  ROS ***  Patient Active Problem List   Diagnosis Date Noted   Parastomal hernia without obstruction or gangrene 06/04/2022   Ileostomy in place 06/04/2022   Anemia, iron deficiency    GERD (gastroesophageal reflux disease) 07/09/2015   HTN (hypertension) 05/19/2015   Depression 05/03/2014   Hematochezia 05/01/2014   Arthritis associated with another disorder 04/29/2014   Noncompliance with medication regimen 04/29/2014   Malnutrition of moderate degree 04/13/2014   Hypokalemia 04/11/2014   Crohn's disease of both small and large intestine with complication 04/11/2014   Anemia of chronic disease     Allergies  Allergen Reactions   Humira (2 Pen) [Adalimumab] Anaphylaxis and Swelling    Itching, swelling  lips     Current Outpatient Medications on File Prior to Visit  Medication Sig Dispense Refill   acetaminophen (TYLENOL) 325 MG tablet Take 2 tablets (650 mg total) by mouth every 6 (six) hours.     ustekinumab (STELARA) 90 MG/ML SOSY injection Inject 1 mL (90 mg total) into the skin every 28 (twenty-eight) days. 1 mL 6   No current facility-administered medications on file prior to visit.    Past Surgical History:  Procedure Laterality Date   COLON SURGERY     2021, ileostomy   NECK MASS EXCISION Left    when pt. was a child   neg hx     PARASTOMAL HERNIA REPAIR N/A 06/06/2022   Procedure: HERNIA REPAIR PARASTOMAL, Illeostomy Revision;  Surgeon: Fritzi Mandes, MD;  Location: [facility] OR;  Service: General;  Laterality: N/A;    Family History  Problem Relation Age of Onset   Diabetes Other        Both sides   Heart disease Other        both sides   Breast cancer Maternal Grandmother    Prostate cancer Paternal Grandfather    Colon cancer Neg Hx    Diabetes Mellitus II Mother    Hypertension Father    Diabetes Mellitus II Father      Social History   Socioeconomic History   Marital status: Single    Spouse name: Not on file   Number of children: 0   Years of education: Not on file   Highest education level: Not on file  Occupational History   Occupation: Production designer, theatre/television/film    Employer: [employer]  Tobacco Use   Smoking status: Never   Smokeless tobacco: Never  Substance and Sexual Activity   Alcohol use: Yes    Alcohol/week: 0.0 standard drinks of alcohol    Comment: rarely   Drug use: Yes    Types: Marijuana    Comment: last used about two hr ago 05/19/2015   Sexual activity: Yes  Other Topics Concern   Not on file  Social History Narrative   Not on file   Social Determinants of Health   Financial Resource Strain: Not on file  Food Insecurity: No Food Insecurity (06/04/2022)   Hunger Vital Sign    Worried About Running Out of Food in the Last Year: Never true    Ran Out of Food in the Last Year: Never true  Transportation Needs: No Transportation Needs (06/04/2022)   PRAPARE - Administrator, Civil Service (Medical): No    Lack of Transportation (Non-Medical): No  Physical Activity: Not on file  Stress: Not on file  Social Connections: Not on file  Intimate Partner Violence: Not At Risk (06/04/2022)   Humiliation, Afraid, Rape, and Kick questionnaire    Fear of Current or Ex-Partner: No    Emotionally Abused: No    Physically Abused: No    Sexually Abused: No    Objective:  There were no vitals filed for this visit.  Physical Exam ***  Assessment & Plan:  There were no encounter diagnoses.  No problem-specific Assessment & Plan notes found for this encounter.    No follow-ups on file.  Patient {GC/GE:[id number]::"discussed with","seen with"} Dr. {[id number]::"NUUVOZDG","U. Hoffman","Mullen","Narendra","Machen","Vincent","Guilloud","Lau"}  Marrianne Mood MD 06/22/2022, 8:40 AM  Pager: [phone]

## 2022-06-28 ENCOUNTER — Other Ambulatory Visit (HOSPITAL_COMMUNITY): Payer: Self-pay

## 2022-06-28 NOTE — Telephone Encounter (Signed)
Called and spoke to Allen at pharmacy- she confirmed that patient is un-insured and filling through [employer] patient assistance.  Approval dates- 08/12/21-08/13/22. Patient will need to re-enroll if he remains uninsured.

## 2022-07-22 ENCOUNTER — Encounter: Payer: Self-pay | Admitting: *Deleted

## 2022-09-14 ENCOUNTER — Encounter: Payer: Self-pay | Admitting: Internal Medicine

## 2022-09-14 ENCOUNTER — Ambulatory Visit (INDEPENDENT_AMBULATORY_CARE_PROVIDER_SITE_OTHER): Payer: Medicare Other | Admitting: Internal Medicine

## 2022-09-14 VITALS — BP 130/80 | HR 115 | Ht 75.0 in | Wt 260.2 lb

## 2022-09-14 DIAGNOSIS — K469 Unspecified abdominal hernia without obstruction or gangrene: Secondary | ICD-10-CM | POA: Diagnosis not present

## 2022-09-14 DIAGNOSIS — Z932 Ileostomy status: Secondary | ICD-10-CM

## 2022-09-14 DIAGNOSIS — K50819 Crohn's disease of both small and large intestine with unspecified complications: Secondary | ICD-10-CM | POA: Diagnosis not present

## 2022-09-14 NOTE — Patient Instructions (Addendum)
You have been scheduled for a colonoscopy. Please follow written instructions given to you at your visit today.   Please pick up your prep supplies at the pharmacy within the next 1-3 days.  If you use inhalers (even only as needed), please bring them with you on the day of your procedure.  DO NOT TAKE 7 DAYS PRIOR TO TEST- Trulicity (dulaglutide) Ozempic, Wegovy (semaglutide) Mounjaro (tirzepatide) Bydureon Bcise (exanatide extended release)  DO NOT TAKE 1 DAY PRIOR TO YOUR TEST Rybelsus (semaglutide) Adlyxin (lixisenatide) Victoza (liraglutide) Byetta (exanatide) ___________________________________________________________________________  We will get you a urgent referral to Dr. Drue Dun.   _______________________________________________________  If your blood pressure at your visit was 140/90 or greater, please contact your primary care physician to follow up on this.  _______________________________________________________  If you are age 76 or older, your body mass index should be between 23-30. Your Body mass index is 32.53 kg/m. If this is out of the aforementioned range listed, please consider follow up with your Primary Care Provider.  If you are age 2 or younger, your body mass index should be between 19-25. Your Body mass index is 32.53 kg/m. If this is out of the aformentioned range listed, please consider follow up with your Primary Care Provider.   ________________________________________________________  The [facility] providers would like to encourage you to use [facility] to communicate with providers for non-urgent requests or questions.  Due to long hold times on the telephone, sending your provider a message by [facility] may be a faster and more efficient way to get a response.  Please allow 48 business hours for a response.  Please remember that this is for non-urgent requests.  _______________________________________________________

## 2022-09-14 NOTE — Progress Notes (Signed)
Subjective:    Patient ID: Troy Ramirez, male    DOB: 12-13-1985, 37 y.o.   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 earlier this year for parastomal hernia repair, currently managed on Stelara who presents for follow-up.  He also has a history of GERD with esophagitis.  He is here today with his mother.  He was seen during hospital admission in March 2024 and underwent parastomal hernia repair with Dr. Freida Busman on 06/06/2022.  Since seeing [place] in the hospitalization he has been back to [facility] and saw Tommy Medal, PA-C with plan for ileoscopy and rectal pouch exam on 10/11/2022.  He reports that he really is living here full-time.  His ex-girlfriend and his 20-year-old son live in [place] and so he does go there from time to time.  He is having significant bulging at his ostomy with pain on a regular daily basis.  At times this pain can be debilitating.  Ostomy output is nonbloody.  There is some minor bleeding as he changes the appliance.  He passes clear to slightly pink-tinged fluid from the rectum 2-3 times a day.  He is taking Stelara every 4 weeks.  No rash or joint symptoms.  Prefers to have his GI and surgical care here where he now lives with his mother.  He hopes to get his own apartment soon.  He is on Medicare disability.   Review of Systems As per HPI, otherwise negative  Current Medications, Allergies, Past Medical History, Past Surgical History, Family History and Social History were reviewed in [employer] record.    Objective:   Physical Exam BP 130/80   Pulse (!) 115   Ht 6\' 3"  (1.905 m)   Wt 260 lb 4 oz (118 kg)   BMI 32.53 kg/m  Gen: awake, alert, NAD HEENT: anicteric  CV: RRR, no mrg Pulm: CTA b/l Abd: soft, ostomy in right lower quadrant with significant parastomal hernia and  tenderness,  +BS throughout Ext: no c/c/e Neuro: nonfocal  CT ABDOMEN AND PELVIS WITH CONTRAST   TECHNIQUE: Multidetector CT imaging of the abdomen and pelvis was performed using the standard protocol following bolus administration of intravenous contrast.   RADIATION DOSE REDUCTION: This exam was performed according to the departmental dose-optimization program which includes automated exposure control, adjustment of the mA and/or kV according to patient size and/or use of iterative reconstruction technique.   CONTRAST:  OMNIPAQUE IOHEXOL 350 MG/ML SOLN   COMPARISON:  07/09/2015   FINDINGS: Lower chest: Lung bases are clear.   Hepatobiliary: No focal liver abnormality is seen. No gallstones, gallbladder wall thickening, or biliary dilatation.   Pancreas: Unremarkable. No pancreatic ductal dilatation or surrounding inflammatory changes.   Spleen: Normal in size without focal abnormality.   Adrenals/Urinary Tract: Adrenal glands are unremarkable. Kidneys are normal, without renal calculi, focal lesion, or hydronephrosis. Bladder is unremarkable.   Stomach/Bowel: Colectomy with right lower quadrant ileostomy and sigmoid stump. Peristomal hernia containing fat and ileum without evidence of proximal obstruction. Mild wall thickening with fluid-filled distal ileum may indicate ileitis. Small bowel are mostly decompressed. Stomach and rectosigmoid stump are unremarkable.   Vascular/Lymphatic: No significant vascular findings are present. No enlarged abdominal or pelvic lymph nodes.   Reproductive: Prostate is unremarkable.   Other: No free air or free fluid in the abdomen.   Musculoskeletal: No acute  or significant osseous findings.   IMPRESSION: 1. Prior colectomy with right lower quadrant ileocolostomy and sigmoid stump. 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 2. Mild wall thickening and fluid-filled terminal  ileum extending into the ileostomy may indicate ileitis. 3. Rectosigmoid stump appears intact and decompressed.     Electronically Signed   By: Burman Nieves M.D.   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    Crohn's ileocolitis (dx 2005; total abdominal colectomy with end ileostomy July 2021; temporizing parastomal hernia repair [facility] March 2024; prior use of methotrexate, azathioprine, Humira stopped due to antibody development and infliximab stopped due to infusion reaction) --he would like to establish and maintain care here in [place] as opposed to [facility].  We need to determine if he has active disease and then he either needs to have his parastomal hernia more permanently repaired or his ileostomy reversed.  We discussed this at length today -- Continue Stelara 90 mg subcu every 4 weeks; he needs to reapply for drug assistance -- Ileoscopy and evaluation of rectal stump tomorrow in the [facility]; MiraLAX prep.  We reviewed the risk, benefits and alternatives and he is agreeable and wishes to proceed -- Referral to Dr. Drue Dun with colorectal surgery for discussion of either parastomal hernia repair or ileostomy takedown; urgent referral given pain  2.  History of IDA secondary to #1 -- Will need lab work preoperatively  3.  GERD with esophagitis --history of and not specifically addressed today  30 minutes total spent today including patient facing time, coordination of care, reviewing medical history/procedures/pertinent radiology studies, and documentation of the encounter.

## 2022-09-15 ENCOUNTER — Encounter: Payer: Self-pay | Admitting: Internal Medicine

## 2022-09-15 ENCOUNTER — Ambulatory Visit (AMBULATORY_SURGERY_CENTER): Payer: Medicare Other | Admitting: Internal Medicine

## 2022-09-15 VITALS — BP 154/84 | HR 84 | Temp 98.6°F | Resp 18 | Ht 75.0 in | Wt 260.0 lb

## 2022-09-15 DIAGNOSIS — K50819 Crohn's disease of both small and large intestine with unspecified complications: Secondary | ICD-10-CM | POA: Diagnosis not present

## 2022-09-15 DIAGNOSIS — Z932 Ileostomy status: Secondary | ICD-10-CM

## 2022-09-15 MED ORDER — SODIUM CHLORIDE 0.9 % IV SOLN
500.0000 mL | INTRAVENOUS | Status: DC
Start: 2022-09-15 — End: 2022-09-15

## 2022-09-15 NOTE — Progress Notes (Signed)
See office note dated yesterday for details and current H&P  Patient presenting for ileoscopy and exam of rectal stump in the setting of Crohn's disease status post subtotal colectomy with end ileostomy  Will see Dr. Drue Dun in the coming days for parastomal hernia  He remains appropriate for ileoscopy and flexible sigmoidoscopy in the [facility] today

## 2022-09-15 NOTE — Op Note (Signed)
[facility] Patient Name: Troy Ramirez Procedure Date: 09/15/2022 2:26 PM MRN: [id number] Endoscopist: Beverley Fiedler , MD, [id number] Age: 37 Referring MD:  Date of Birth: September 15, 1985 Gender: Male Account #: [id number] Procedure:                Ileoscopy Indications:              History of total colectomy for Crohn's disease of                            small and large intestine in 2021; now pain due to                            recurrent parastomal hernia, assess disease                            activity on Stelara Medicines:                Monitored Anesthesia Care Procedure:                Pre-Anesthesia Assessment:                           - Prior to the procedure, a History and Physical                            was performed, and patient medications and                            allergies were reviewed. The patient's tolerance of                            previous anesthesia was also reviewed. The risks                            and benefits of the procedure and the sedation                            options and risks were discussed with the patient.                            All questions were answered, and informed consent                            was obtained. Prior Anticoagulants: The patient has                            taken no anticoagulant or antiplatelet agents. ASA                            Grade Assessment: II - A patient with mild systemic                            disease. After reviewing the risks and benefits,  the patient was deemed in satisfactory condition to                            undergo the procedure.                           After I obtained informed consent, the scope was                            passed under direct vision. Throughout the                            procedure, the patient's blood pressure, pulse, and                            oxygen saturations were monitored continuously. The                             GIF [id number] was introduced through the                            ileostomy and advanced to the distal ileum. The                            ileoscopy was performed without difficulty. The                            patient tolerated the procedure well. The quality                            of the bowel preparation was good. Scope In: 2:43:21 PM Scope Out: 2:47:16 PM Total Procedure Duration: 0 hours 3 minutes 55 seconds  Findings:                 Patient is status-post total colectomy with an end                            ileostomy. Parastomal hernia present. Ostomy widely                            patent.                           The terminal ileum appeared normal and was examined                            for 20-25 cm. Biopsies were taken with a cold                            forceps for histology. Complications:            No immediate complications. Estimated Blood Loss:     Estimated blood loss was minimal. Impression:               - The examined portion of the ileum was normal. No  evidence of active Crohn's. Biopsied.                           - Parastomal hernia. Recommendation:           - Patient has a contact number available for                            emergencies. The signs and symptoms of potential                            delayed complications were discussed with the                            patient. Return to normal activities tomorrow.                            Written discharge instructions were provided to the                            patient.                           - Resume previous diet.                           - Await pathology results.                           - Proceed with consult with Dr. Drue Dun. Beverley Fiedler, MD 09/15/2022 3:00:53 PM This report has been signed electronically.

## 2022-09-15 NOTE — Progress Notes (Signed)
Pt's states no medical or surgical changes since previsit or office visit. 

## 2022-09-15 NOTE — Op Note (Signed)
[facility] Patient Name: Troy Ramirez Procedure Date: 09/15/2022 2:31 PM MRN: [id number] Endoscopist: Beverley Fiedler , MD, [id number] Age: 37 Referring MD:  Date of Birth: 1985/08/15 Gender: Male Account #: [id number] Procedure:                Flexible Sigmoidoscopy Indications:              Personal history of Crohn's disease Medicines:                Monitored Anesthesia Care Procedure:                Pre-Anesthesia Assessment:                           - Prior to the procedure, a History and Physical                            was performed, and patient medications and                            allergies were reviewed. The patient's tolerance of                            previous anesthesia was also reviewed. The risks                            and benefits of the procedure and the sedation                            options and risks were discussed with the patient.                            All questions were answered, and informed consent                            was obtained. Prior Anticoagulants: The patient has                            taken no anticoagulant or antiplatelet agents. ASA                            Grade Assessment: II - A patient with mild systemic                            disease. After reviewing the risks and benefits,                            the patient was deemed in satisfactory condition to                            undergo the procedure.                           After obtaining informed consent, the scope was  passed under direct vision. The GIF [id number] #[id number]                            was introduced through the anus and advanced to the                            rectum. The flexible sigmoidoscopy was accomplished                            without difficulty. The patient tolerated the                            procedure well. No bowel preparation was given                            prior to the  procedure. The quality of                            visualization was good. Scope In: 2:50:33 PM Scope Out: 2:52:13 PM Total Procedure Duration: 0 hours 1 minute 40 seconds  Findings:                 The digital rectal exam revealed short rectal stump.                           There was evidence of a prior surgery in the distal                            rectum. This mucosa was characterized by erythema                            and friable mucosa (most likely diversion related).                            This was biopsied with a cold forceps for                            histology. Rectal stump 3-4 cm in length (at max). Complications:            No immediate complications. Estimated Blood Loss:     Estimated blood loss was minimal. Impression:               - Short rectal stump characterized by erythema and                            friable mucosa (possibly related to diversion).                            Biopsied. Recommendation:           - Patient has a contact number available for                            emergencies. The signs and symptoms of potential  delayed complications were discussed with the                            patient. Return to normal activities tomorrow.                            Written discharge instructions were provided to the                            patient.                           - Resume previous diet.                           - Continue present medications including Stelara.                           - Await pathology results.                           - Proceed with consult with Dr. Drue Dun regarding                            ostomy take down. Beverley Fiedler, MD 09/15/2022 3:07:35 PM This report has been signed electronically.

## 2022-09-15 NOTE — Progress Notes (Signed)
Called to room to assist during endoscopic procedure.  Patient ID and intended procedure confirmed with present staff. Received instructions for my participation in the procedure from the performing physician.  

## 2022-09-15 NOTE — Patient Instructions (Addendum)
Await pathology results Resume previous diet and continue present medications including the Stelera  Proceed with consult with Dr. Drue Dun regarding ostomy take down    YOU HAD AN ENDOSCOPIC PROCEDURE TODAY AT THE [facility]:   Refer to the procedure report that was given to you for any specific questions about what was found during the examination.  If the procedure report does not answer your questions, please call your gastroenterologist to clarify.  If you requested that your care partner not be given the details of your procedure findings, then the procedure report has been included in a sealed envelope for you to review at your convenience later.  YOU SHOULD EXPECT: Some feelings of bloating in the abdomen. Passage of more gas than usual.  Walking can help get rid of the air that was put into your GI tract during the procedure and reduce the bloating. If you had a lower endoscopy (such as a colonoscopy or flexible sigmoidoscopy) you may notice spotting of blood in your stool or on the toilet paper. If you underwent a bowel prep for your procedure, you may not have a normal bowel movement for a few days.  Please Note:  You might notice some irritation and congestion in your nose or some drainage.  This is from the oxygen used during your procedure.  There is no need for concern and it should clear up in a day or so.  SYMPTOMS TO REPORT IMMEDIATELY:  Following lower endoscopy (colonoscopy or flexible sigmoidoscopy):  Excessive amounts of blood in the stool  Significant tenderness or worsening of abdominal pains  Swelling of the abdomen that is new, acute  Fever of 100F or higher  For urgent or emergent issues, a gastroenterologist can be reached at any hour by calling [phone]. Do not use MyChart messaging for urgent concerns.    DIET:  We do recommend a small meal at first, but then you may proceed to your regular diet.  Drink plenty of fluids but you should avoid  alcoholic beverages for 24 hours.  ACTIVITY:  You should plan to take it easy for the rest of today and you should NOT DRIVE or use heavy machinery until tomorrow (because of the sedation medicines used during the test).    FOLLOW UP: Our staff will call the number listed on your records the next business day following your procedure.  We will call around 7:15- 8:00 am to check on you and address any questions or concerns that you may have regarding the information given to you following your procedure. If we do not reach you, we will leave a message.     If any biopsies were taken you will be contacted by phone or by letter within the next 1-3 weeks.  Please call us at [phone] if you have not heard about the biopsies in 3 weeks.    SIGNATURES/CONFIDENTIALITY: You and/or your care partner have signed paperwork which will be entered into your electronic medical record.  These signatures attest to the fact that that the information above on your After Visit Summary has been reviewed and is understood.  Full responsibility of the confidentiality of this discharge information lies with you and/or your care-partner.

## 2022-09-16 ENCOUNTER — Telehealth: Payer: Self-pay

## 2022-09-16 NOTE — Telephone Encounter (Signed)
Follow up call placed, no answer and no VM. 

## 2022-10-06 ENCOUNTER — Other Ambulatory Visit (HOSPITAL_COMMUNITY): Payer: Self-pay

## 2022-10-06 ENCOUNTER — Other Ambulatory Visit: Payer: Self-pay

## 2023-01-13 ENCOUNTER — Ambulatory Visit: Payer: Medicare Other | Admitting: Nurse Practitioner

## 2023-11-29 ENCOUNTER — Other Ambulatory Visit: Payer: Self-pay
